# Patient Record
Sex: Female | Born: 1945 | Race: White | Hispanic: No | Marital: Married | Smoking: Never smoker
Health system: Southern US, Community
[De-identification: ages and names within clinical notes are randomized; demographics above are authoritative.]

## PROBLEM LIST (undated history)

## (undated) DIAGNOSIS — M5030 Other cervical disc degeneration, unspecified cervical region: Secondary | ICD-10-CM

## (undated) DIAGNOSIS — D649 Anemia, unspecified: Secondary | ICD-10-CM

## (undated) DIAGNOSIS — K589 Irritable bowel syndrome without diarrhea: Secondary | ICD-10-CM

## (undated) DIAGNOSIS — J45909 Unspecified asthma, uncomplicated: Secondary | ICD-10-CM

## (undated) DIAGNOSIS — R053 Chronic cough: Secondary | ICD-10-CM

## (undated) DIAGNOSIS — K219 Gastro-esophageal reflux disease without esophagitis: Secondary | ICD-10-CM

## (undated) DIAGNOSIS — H269 Unspecified cataract: Secondary | ICD-10-CM

## (undated) DIAGNOSIS — R05 Cough: Secondary | ICD-10-CM

## (undated) DIAGNOSIS — M8580 Other specified disorders of bone density and structure, unspecified site: Secondary | ICD-10-CM

## (undated) DIAGNOSIS — T7840XA Allergy, unspecified, initial encounter: Secondary | ICD-10-CM

## (undated) HISTORY — DX: Other cervical disc degeneration, unspecified cervical region: M50.30

## (undated) HISTORY — DX: Anemia, unspecified: D64.9

## (undated) HISTORY — PX: BREAST LUMPECTOMY: SHX2

## (undated) HISTORY — DX: Allergy, unspecified, initial encounter: T78.40XA

## (undated) HISTORY — DX: Other specified disorders of bone density and structure, unspecified site: M85.80

## (undated) HISTORY — PX: OTHER SURGICAL HISTORY: SHX169

## (undated) HISTORY — PX: TUBAL LIGATION: SHX77

## (undated) HISTORY — DX: Cough: R05

## (undated) HISTORY — PX: FOOT SURGERY: SHX648

## (undated) HISTORY — DX: Irritable bowel syndrome, unspecified: K58.9

## (undated) HISTORY — DX: Unspecified cataract: H26.9

## (undated) HISTORY — DX: Chronic cough: R05.3

## (undated) HISTORY — DX: Unspecified asthma, uncomplicated: J45.909

## (undated) HISTORY — DX: Gastro-esophageal reflux disease without esophagitis: K21.9

---

## 1998-04-29 ENCOUNTER — Ambulatory Visit (HOSPITAL_COMMUNITY): Admission: RE | Admit: 1998-04-29 | Discharge: 1998-04-29 | Payer: Self-pay | Admitting: Obstetrics and Gynecology

## 1999-01-30 ENCOUNTER — Other Ambulatory Visit: Admission: RE | Admit: 1999-01-30 | Discharge: 1999-01-30 | Payer: Self-pay | Admitting: Obstetrics and Gynecology

## 1999-08-13 ENCOUNTER — Encounter: Payer: Self-pay | Admitting: Internal Medicine

## 1999-08-13 ENCOUNTER — Encounter: Admission: RE | Admit: 1999-08-13 | Discharge: 1999-08-13 | Payer: Self-pay | Admitting: Internal Medicine

## 2000-04-05 ENCOUNTER — Encounter: Payer: Self-pay | Admitting: Obstetrics and Gynecology

## 2000-04-05 ENCOUNTER — Encounter: Admission: RE | Admit: 2000-04-05 | Discharge: 2000-04-05 | Payer: Self-pay | Admitting: Obstetrics and Gynecology

## 2000-04-26 ENCOUNTER — Other Ambulatory Visit: Admission: RE | Admit: 2000-04-26 | Discharge: 2000-04-26 | Payer: Self-pay | Admitting: Obstetrics and Gynecology

## 2001-04-06 ENCOUNTER — Encounter: Admission: RE | Admit: 2001-04-06 | Discharge: 2001-04-06 | Payer: Self-pay | Admitting: Obstetrics and Gynecology

## 2001-04-06 ENCOUNTER — Encounter: Payer: Self-pay | Admitting: Obstetrics and Gynecology

## 2001-04-27 ENCOUNTER — Other Ambulatory Visit: Admission: RE | Admit: 2001-04-27 | Discharge: 2001-04-27 | Payer: Self-pay | Admitting: Obstetrics and Gynecology

## 2002-05-23 ENCOUNTER — Other Ambulatory Visit: Admission: RE | Admit: 2002-05-23 | Discharge: 2002-05-23 | Payer: Self-pay | Admitting: Obstetrics and Gynecology

## 2002-06-06 ENCOUNTER — Encounter: Payer: Self-pay | Admitting: Obstetrics and Gynecology

## 2002-06-06 ENCOUNTER — Encounter: Admission: RE | Admit: 2002-06-06 | Discharge: 2002-06-06 | Payer: Self-pay | Admitting: Obstetrics and Gynecology

## 2003-06-11 ENCOUNTER — Encounter: Admission: RE | Admit: 2003-06-11 | Discharge: 2003-06-11 | Payer: Self-pay | Admitting: Obstetrics and Gynecology

## 2003-06-20 ENCOUNTER — Other Ambulatory Visit: Admission: RE | Admit: 2003-06-20 | Discharge: 2003-06-20 | Payer: Self-pay | Admitting: Obstetrics and Gynecology

## 2004-06-25 ENCOUNTER — Other Ambulatory Visit: Admission: RE | Admit: 2004-06-25 | Discharge: 2004-06-25 | Payer: Self-pay | Admitting: Obstetrics and Gynecology

## 2004-07-23 ENCOUNTER — Ambulatory Visit: Payer: Self-pay | Admitting: Internal Medicine

## 2004-11-23 ENCOUNTER — Ambulatory Visit: Payer: Self-pay | Admitting: Internal Medicine

## 2005-07-16 ENCOUNTER — Other Ambulatory Visit: Admission: RE | Admit: 2005-07-16 | Discharge: 2005-07-16 | Payer: Self-pay | Admitting: Obstetrics and Gynecology

## 2007-04-26 ENCOUNTER — Ambulatory Visit: Payer: Self-pay | Admitting: Internal Medicine

## 2007-05-05 ENCOUNTER — Encounter: Admission: RE | Admit: 2007-05-05 | Discharge: 2007-05-05 | Payer: Self-pay | Admitting: Internal Medicine

## 2007-06-29 ENCOUNTER — Ambulatory Visit: Payer: Self-pay | Admitting: Internal Medicine

## 2007-06-29 DIAGNOSIS — R059 Cough, unspecified: Secondary | ICD-10-CM | POA: Insufficient documentation

## 2007-06-29 DIAGNOSIS — K219 Gastro-esophageal reflux disease without esophagitis: Secondary | ICD-10-CM | POA: Insufficient documentation

## 2007-06-29 DIAGNOSIS — R05 Cough: Secondary | ICD-10-CM

## 2007-06-29 DIAGNOSIS — J309 Allergic rhinitis, unspecified: Secondary | ICD-10-CM | POA: Insufficient documentation

## 2007-07-03 ENCOUNTER — Ambulatory Visit: Payer: Self-pay | Admitting: Internal Medicine

## 2007-07-11 ENCOUNTER — Telehealth (INDEPENDENT_AMBULATORY_CARE_PROVIDER_SITE_OTHER): Payer: Self-pay | Admitting: *Deleted

## 2007-07-18 ENCOUNTER — Encounter: Payer: Self-pay | Admitting: Internal Medicine

## 2007-08-30 ENCOUNTER — Encounter: Payer: Self-pay | Admitting: Internal Medicine

## 2007-09-13 ENCOUNTER — Ambulatory Visit: Payer: Self-pay | Admitting: Internal Medicine

## 2007-12-22 ENCOUNTER — Ambulatory Visit: Payer: Self-pay | Admitting: Internal Medicine

## 2008-02-02 ENCOUNTER — Ambulatory Visit: Payer: Self-pay | Admitting: Internal Medicine

## 2008-04-05 ENCOUNTER — Telehealth: Payer: Self-pay | Admitting: Internal Medicine

## 2008-04-08 ENCOUNTER — Ambulatory Visit: Payer: Self-pay | Admitting: Internal Medicine

## 2008-04-08 DIAGNOSIS — K589 Irritable bowel syndrome without diarrhea: Secondary | ICD-10-CM | POA: Insufficient documentation

## 2008-04-12 ENCOUNTER — Ambulatory Visit: Payer: Self-pay | Admitting: Internal Medicine

## 2008-04-29 ENCOUNTER — Telehealth: Payer: Self-pay | Admitting: Internal Medicine

## 2008-06-21 ENCOUNTER — Ambulatory Visit: Payer: Self-pay | Admitting: Internal Medicine

## 2009-09-15 ENCOUNTER — Telehealth: Payer: Self-pay | Admitting: Internal Medicine

## 2009-09-15 ENCOUNTER — Ambulatory Visit (HOSPITAL_COMMUNITY): Admission: RE | Admit: 2009-09-15 | Discharge: 2009-09-15 | Payer: Self-pay | Admitting: Internal Medicine

## 2009-09-15 ENCOUNTER — Ambulatory Visit: Payer: Self-pay | Admitting: Internal Medicine

## 2009-09-15 DIAGNOSIS — R1011 Right upper quadrant pain: Secondary | ICD-10-CM | POA: Insufficient documentation

## 2009-09-15 DIAGNOSIS — R1013 Epigastric pain: Secondary | ICD-10-CM | POA: Insufficient documentation

## 2009-09-16 ENCOUNTER — Telehealth: Payer: Self-pay | Admitting: Physician Assistant

## 2009-09-16 DIAGNOSIS — K8020 Calculus of gallbladder without cholecystitis without obstruction: Secondary | ICD-10-CM | POA: Insufficient documentation

## 2009-09-16 LAB — CONVERTED CEMR LAB
Albumin: 4 g/dL (ref 3.5–5.2)
BUN: 14 mg/dL (ref 6–23)
Basophils Absolute: 0 10*3/uL (ref 0.0–0.1)
Basophils Relative: 0.5 % (ref 0.0–3.0)
CO2: 30 meq/L (ref 19–32)
Calcium: 9.3 mg/dL (ref 8.4–10.5)
Chloride: 108 meq/L (ref 96–112)
Creatinine, Ser: 1 mg/dL (ref 0.4–1.2)
Eosinophils Absolute: 0.1 10*3/uL (ref 0.0–0.7)
GFR calc non Af Amer: 59.39 mL/min (ref >60)
Hemoglobin: 13.9 g/dL (ref 12.0–15.0)
Lymphocytes Relative: 26.8 % (ref 12.0–46.0)
Monocytes Relative: 8.4 % (ref 3.0–12.0)
Neutro Abs: 2.2 10*3/uL (ref 1.4–7.7)
Neutrophils Relative %: 62.3 % (ref 43.0–77.0)
Potassium: 4.1 meq/L (ref 3.5–5.1)
RBC: 4.36 M/uL (ref 3.87–5.11)
RDW: 12.4 % (ref 11.5–14.6)

## 2009-10-01 ENCOUNTER — Encounter: Payer: Self-pay | Admitting: Internal Medicine

## 2009-11-09 HISTORY — PX: LAPAROSCOPIC CHOLECYSTECTOMY: SUR755

## 2009-11-17 ENCOUNTER — Ambulatory Visit (HOSPITAL_COMMUNITY): Admission: RE | Admit: 2009-11-17 | Discharge: 2009-11-17 | Payer: Self-pay | Admitting: General Surgery

## 2009-12-10 ENCOUNTER — Encounter: Payer: Self-pay | Admitting: Internal Medicine

## 2010-02-10 ENCOUNTER — Telehealth (INDEPENDENT_AMBULATORY_CARE_PROVIDER_SITE_OTHER): Payer: Self-pay | Admitting: *Deleted

## 2010-02-12 ENCOUNTER — Ambulatory Visit: Payer: Self-pay | Admitting: Internal Medicine

## 2010-02-19 ENCOUNTER — Telehealth (INDEPENDENT_AMBULATORY_CARE_PROVIDER_SITE_OTHER): Payer: Self-pay | Admitting: *Deleted

## 2010-02-24 ENCOUNTER — Encounter: Payer: Self-pay | Admitting: Internal Medicine

## 2010-03-26 ENCOUNTER — Ambulatory Visit: Payer: Self-pay | Admitting: Internal Medicine

## 2010-03-26 DIAGNOSIS — J209 Acute bronchitis, unspecified: Secondary | ICD-10-CM | POA: Insufficient documentation

## 2010-08-11 NOTE — Letter (Signed)
Summary: [facility]   Imported By: Lester Whiteash 10/28/2009 09:38:23  _____________________________________________________________________  External Attachment:    Type:   Image     Comment:   External Document

## 2010-08-11 NOTE — Progress Notes (Signed)
Summary: pt waiting on Prior Auth.  Phone Note Call from Patient Call back at Home Phone [phone]   Caller: Patient Call For: young Summary of Call: pt says [facility] [address] has faxed request for prior auth for SINGULAIR.  Initial call taken by: Tivis Ringer, CNA,  February 19, 2010 10:17 AM  Follow-up for Phone Call        tammy is working on this in triage   Philipp Deputy CMA  February 19, 2010 11:17 AM   form filled out and gave to cy for questions and signature  Philipp Deputy [facility]  February 19, 2010 2:31 PM   pt called back to make sure we were working on this prior auth.  Has not started Singulair yet . Follow-up by: Eugene Gavia,  February 20, 2010 9:13 AM  Additional Follow-up for Phone Call Additional follow up Details #1::        Forms faxed back and in Triage waiting a decision.Reynaldo Minium CMA  February 20, 2010 2:17 PM    PA has been approved 02/21/2010-11/16/2012; pharmacy aware and left message on machine for pt.Reynaldo Minium CMA  February 24, 2010 9:48 AM

## 2010-08-11 NOTE — Progress Notes (Signed)
Summary: TRIAGE: Work in Teacher, English as a foreign language from Patient Call back at [employer] [phone]   Call For: DR Leone Payor Reason for Call: Talk to Nurse Summary of Call: Is in alot of pain and wants to be worked in - today if possible. Initial call taken by: Leanor Kail [facility],  September 15, 2009 12:31 PM  Follow-up for Phone Call        Pt c/o upper abd pain off and on since Friday pm.  Pain comes and goes.  Severe at times.  OV sch for 2:30 today with Mike Gip, PA. Follow-up by: Ashok Cordia RN,  September 15, 2009 12:44 PM

## 2010-08-11 NOTE — Progress Notes (Signed)
Summary: Triage  Phone Note Call from Patient Call back at Home Phone [phone]   Caller: Patient Call For: Mike Gip Reason for Call: Lab or Test Results Summary of Call: The bloodwork that was taken this morning showed elevated Liver Counts. And she wants to know why they are so elevated? Initial call taken by: Karna Christmas,  September 16, 2009 1:59 PM  Follow-up for Phone Call        I mentioned to Amy the pt's concern of the elevated ALT and AST tests.  She immediately said it is the Gallbladder.  I informed the pt.  Follow-up by: Joselyn Glassman,  September 16, 2009 3:01 PM

## 2010-08-11 NOTE — Assessment & Plan Note (Signed)
Summary: cough,congestion,weak,diarrhea,hoarse,? bronchitis/apc   Copy to:  n/a Primary [name]/Referring [name]:  n/a  CC:  Accute visit-cough, nausea, fever/chilss, not wanting to eat, and chest tightness since Saturday.Beverly [name]  History of Present Illness: 06/21/08- Cough, allergic rhinitis, GERD Has replaced carpet with hardwood and put down vapor barrier under house. On samples Kapedex for GERD. Doesn't feel heartburn. Coughing paroxysms productive white. Seat belt, cold air and rescue inhalers all tend to trigger.  February 12, 2010- Cough, allergic rhinitis, GERD Since last here had Lap chole.in May. Cough has been known to be related to reflux in past, but not affected by the GB surgery. had no anesthesia problems. She says cough has actually been worse. She has had renovation and sheet rock sanding at homes in [place] and here. Has also felt more easily short of breath walking in this heat. Always feels sinuses drain with increased sneeze when she dips her head down. She asks about Singulair, which we had tried years ago but will revisit.  March 26, 2010- Cough, allergic rhinitis, GERD She has been taking the Singulair from last visit.  . Got a cold coming back from [facility] 03/01/10. husband got over it but she didn't- still tired.. Acutely worse 5 [place] Nausea, diarrhea, headache, fever 99 range, dry cough. No appetite, queasy, weak. Still some diarrhea. Took Aleve and immodium. She put herself on Daliresp for first time 3 days ago, hoping to stop the cough which is producitve of copious clear sticky mucus. She was already having diarrhea when she started it. She is not seeking a lot of meds.   Preventive Screening-Counseling & Management  Alcohol-Tobacco     Smoking Status: never  Current Medications (verified): 1)  Glucosamine-Chondroitin 1500-1200 Mg/44ml  Liqd (Glucosamine-Chondroitin) .... Take 1 By Mouth Once Daily 2)  Zyrtec Allergy 10  Mg  Tabs (Cetirizine Hcl) .... Take 1 By Mouth At Bedtime 3)  Femhrt 1/5 1-5 Mg-Mcg  Tabs (Norethindrone-Eth Estradiol) .... Take 1/2 Tablet By Mouth Every Other Day 4)  Imodium Advanced 2-125 Mg Tabs (Loperamide-Simethicone) .... Take 1 By Mouth Once Daily As Needed 5)  Omeprazole 20 Mg Cpdr (Omeprazole) .Beverly [name].. 1 By Mouth Once Daily 6)  Claritin 10 Mg Tabs (Loratadine) .... Take 1 By Mouth Once Daily 7)  Daily Multiple Vitamins  Tabs (Multiple Vitamin) .Beverly [name].. 1 By Mouth Once Daily 8)  Singulair 10 Mg Tabs (Montelukast Sodium) .Beverly [name].. 1 Daily 9)  Daliresp 500 Mcg .Beverly KitchenMarland [name]. 1 Daily  Allergies (verified): No Known Drug Allergies  Past History:  Past Surgical History: Last updated: 02/02/2008 Foot Rt. repair Tubal ligation Lumpectomy bilateral  Neg.  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  Risk Factors: Caffeine Use: 1 (02/02/2008) Exercise: yes (04/08/2008)  Risk Factors: Smoking Status: never (03/26/2010)  Past Medical History: ALLERGIC RHINITIS  ESOPHAGEAL REFLUX  Chronic cough IBS  Review of Systems      See HPI       The patient complains of shortness of breath with activity, productive cough, chest pain, weight change, and fever.  The patient denies coughing up blood, irregular heartbeats, acid heartburn, indigestion, loss of appetite, abdominal pain, difficulty swallowing, sore throat, tooth/dental problems, headaches, nasal congestion/difficulty breathing through nose, sneezing, ear ache, anxiety,  hand/feet swelling, joint stiffness or pain, and rash.    Vital Signs:  Patient profile:   65 year old female Height:      64.25 inches Weight:      122.38 pounds BMI:     20.92 O2 Sat:      97 % on Room air Temp:     98.6 degrees F oral Pulse rate:   86 / minute BP sitting:   118 / 76  (left arm) Cuff size:   regular  Vitals Entered By: Reynaldo Minium CMA (March 26, 2010 4:17 PM)  O2 Flow:  Room air CC: Accute visit-cough, nausea,fever/chilss, not wanting to eat, chest tightness since Saturday.   Physical Exam  Additional Exam:  General: A/Ox3; pleasant and cooperative, NAD, SKIN: no rash, lesions NODES: no lymphadenopathy HEENT: [place]/AT, EOM- WNL, Conjuctivae- clear, PERRLA, TM-WNL, Nose- clear, Throat- clear and wnl, Mallampati III NECK: Supple w/ fair ROM, JVD- none, normal carotid impulses w/o bruits Thyroid- normal to palpation, no stridor or hoarseness CHEST: few crackles, dry cough HEART: RRR, no m/g/r heard ABDOMEN: Slender FBP:ZWCH, nl pulses, no edema  NEURO: Grossly intact to observation      Impression & Recommendations:  Problem # 1:  ACUTE BRONCHITIS (ICD-466.0)  Acute infection -bronchitis and gastroenteritis. I have seen 3 people with  similar story today, except for the gastroenteritis symptoms, so there is somethng passing through the community.  Will give Z pak, encourage fluisds and rest. Hydrocodone for cough if needed. She can use immodium as she has been if needed. Her updated medication list for this problem includes:    Singulair 10 Mg Tabs (Montelukast sodium) .Beverly [name]... 1 daily    Zithromax Z-pak 250 Mg Tabs (Azithromycin) .Beverly [name]... 2 today then one daily  Problem # 2:  COUGH (ICD-786.2) Chronic cough has been a problem for years but not dominant now. it has been felt by me and by [facility], to be caused by reflux which she has under better control.  Medications Added to Medication List This Visit: 1)  Zithromax Z-pak 250 Mg Tabs (Azithromycin)  .... 2 today then one daily  Other Orders: Est. Patient Level III (85277)  Patient Instructions: 1)  Keep scheduled appointment- earlier as needed 2)  Script for Z pak antibiotic sent 3)  Stay warm and rested, plenty of fluids Prescriptions: ZITHROMAX Z-PAK 250 MG TABS (AZITHROMYCIN) 2 today then one daily  #1 pak x 0   Entered and Authorized by:   Waymon Budge MD   Signed by:   Waymon Budge MD on 03/26/2010   Method used:   Electronically to        [facility]. [phone]* (retail)       [phone]       [place], [place]  [address]       Ph: [phone]       Fax: [phone]   RxID:   [id number]

## 2010-08-11 NOTE — Progress Notes (Signed)
Summary: cough  Phone Note Call from Patient Call back at Home Phone [phone]   Caller: Patient Call For: young Summary of Call: pt has been coughing "for 10 yrs". says she has 2 friends who are nurses who questioned why she has not been put on singulair. [phone]. NOTE: pt hasn't seen dr young since 06/21/08 but says that dr young "wanted her to be on a long leash".  Initial call taken by: Tivis Ringer, CNA,  February 10, 2010 10:16 AM  Follow-up for Phone Call        pt refused appt for today at 4 pm  will forward to [facility] to find another work in slot for pt --pt aware   Philipp Deputy CMA  February 10, 2010 10:39 AM   Additional Follow-up for Phone Call Additional follow up Details #1::        Spoke with pt-appt made for Thursday 02-12-10 at 130pm.Katie [facility] CMA  February 10, 2010 12:27 PM

## 2010-08-11 NOTE — Assessment & Plan Note (Signed)
Summary: FOLLOW UP-cough/concerns/kcw   Copy to:  n/a Primary Provider/Referring Provider:  n/a  CC:  Follow up visit-cough x 10 years..  History of Present Illness: 12/22/07- 65 year old woman returning for follow-up of chronic cough, associated with allergic rhinitis, and especially with Genella Rife.  She is more satisfied now that reflux is an important part of this cough.  She never feels indigestion.  She avoids trigger foods.  Elevating head of bed has helped him.  When compared with vacation when the bed is not elevated.  Her cat died.  We discussed carpet in the home and environmental precautions, giving her printed information.  She says cold air, vinegar salad dressing, coffee, Coca-Cola, spicy food, and sitting down in the car, are all apt to trigger her cough. Denies headache, sinus drainage, sneezing chest pain, dyspnea, n/v/d, weight loss, fever, edema.   06/21/08- Cough, allergic rhinitis, GERD Has replaced carpet with hardwood and put down vapor barrier under house. On samples Kapedex for GERD. Doesn't feel heartburn. Coughing paroxysms productive white. Seat belt, cold air and rescue inhalers all tend to trigger.  February 12, 2010- Cough, allergic rhinitis, GERD Since last here had Lap chole.in May. Cough has been known to be related to reflux in past, but not affected by the GB surgery. had no anesthesia problems. She says cough has actually been worse. She has had renovation and sheet rock sanding at homes in [place] and here. Has also felt more easily short of breath walking in this heat. Always feels sinuses drain with increased sneeze when she dips her head down. She asks about Singulair, which we had tried years ago but will revisit.     Preventive Screening-Counseling & Management  Alcohol-Tobacco     Smoking Status: never  Current Medications (verified): 1)  Glucosamine-Chondroitin 1500-1200 Mg/47ml  Liqd (Glucosamine-Chondroitin) .... Take 1 By Mouth Once Daily 2)   Zyrtec Allergy 10 Mg  Tabs (Cetirizine Hcl) .... Take 1 By Mouth At Bedtime 3)  Femhrt 1/5 1-5 Mg-Mcg  Tabs (Norethindrone-Eth Estradiol) .... Take 1/2 Tablet By Mouth Every Other Day 4)  Imodium Advanced 2-125 Mg Tabs (Loperamide-Simethicone) .... Take 1 By Mouth Once Daily As Needed 5)  Omeprazole 20 Mg Cpdr (Omeprazole) .Marland Kitchen.. 1 By Mouth Once Daily 6)  Claritin 10 Mg Tabs (Loratadine) .... Take 1 By Mouth Once Daily 7)  Daily Multiple Vitamins  Tabs (Multiple Vitamin) .Marland Kitchen.. 1 By Mouth Once Daily  Allergies (verified): No Known Drug Allergies  Past History:  Past Medical History: Last updated: 2009-09-23 ALLERGIC RHINITIS    ESOPHAGEAL REFLUX  IBS  Past Surgical History: Last updated: 02/02/2008 Foot Rt. repair Tubal ligation Lumpectomy bilateral  Neg.  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  Risk Factors: Caffeine Use: 1 (02/02/2008) Exercise: yes (04/08/2008)  Risk Factors: Smoking Status: never (02/12/2010)  Social History: Smoking Status:  never  Review of Systems      See HPI       The patient complains of shortness of breath with activity, nasal congestion/difficulty breathing through nose, and sneezing.  The patient  denies shortness of breath at rest, productive cough, non-productive cough, coughing up blood, chest pain, irregular heartbeats, acid heartburn, indigestion, loss of appetite, weight change, abdominal pain, difficulty swallowing, sore throat, tooth/dental problems, and  headaches.    Vital Signs:  Patient profile:   65 year old female Height:      64.25 inches Weight:      124.13 pounds O2 Sat:      98 % on Room air Pulse rate:   63 / minute BP sitting:   120 / 72  (left arm) Cuff size:   regular  Vitals Entered By: Reynaldo Minium CMA (February 12, 2010 1:41 PM)  O2 Flow:  Room air CC: Follow up visit-cough x 10 years.   Physical Exam  Additional Exam:  General: A/Ox3; pleasant and cooperative, NAD, SKIN: no rash, lesions NODES: no lymphadenopathy HEENT: [place]/AT, EOM- WNL, Conjuctivae- clear, PERRLA, TM-WNL, Nose- clear, Throat- clear and wnl, Mallampati III NECK: Supple w/ fair ROM, JVD- none, normal carotid impulses w/o bruits Thyroid- normal to palpation, no stridor or hoarseness CHEST: Clear to P&A, dry cough HEART: RRR, no m/g/r heard ABDOMEN: Slender XLK:GMWN, nl pulses, no edema  NEURO: Grossly intact to observation      Impression & Recommendations:  Problem # 1:  COUGH (ICD-786.2)  Multifactorial cough with allergic and reflux components identified previously. She also has been exposed to the orange ozone air quality and dusty home renovations. She doesn't like inhalers, which tend to make her cough till her eyes water. We will try Singulair, then maybe try Daliresp.  Problem # 2:  ALLERGIC RHINITIS (ICD-477.9)  No overt sinus infectin, but she does note some positional drainage sensation withut headache or discolored mucus. Her updated medication list for this problem includes:    Zyrtec Allergy 10 Mg Tabs (Cetirizine hcl) .Marland Kitchen... Take 1 by mouth at bedtime    Claritin 10 Mg Tabs (Loratadine) .Marland Kitchen... Take 1 by mouth once daily  Medications Added to Medication List This Visit: 1)  Zyrtec Allergy 10 Mg Tabs (Cetirizine hcl) .... Take 1 by mouth at bedtime 2)  Claritin 10 Mg Tabs (Loratadine) .... Take 1 by mouth once daily 3)  Singulair 10 Mg Tabs (Montelukast sodium) .Marland Kitchen.. 1 daily 4)  Daliresp 500 Mcg  .Marland Kitchen.. 1 daily  Other  Orders: Est. Patient Level IV (02725)  Patient Instructions: 1)  Please schedule a follow-up appointment in 1 month. 2)  Sample/ script Singulair 10 mg   1 daily 3)  If that doesn't work well enough, then try  sample/ script 4)  Daliresp 500 micrograms, 1 daily Prescriptions: DALIRESP 500 MCG 1 daily  #30 x prn   Entered and Authorized by:   Waymon Budge MD   Signed by:   Waymon Budge MD on 02/12/2010   Method used:   Print then Give to Patient   RxID:   [id number] SINGULAIR 10 MG TABS (MONTELUKAST SODIUM) 1 daily  #30 x prn   Entered and Authorized by:   Waymon Budge MD   Signed by:   Waymon Budge MD on 02/12/2010   Method used:   Print then Give to Patient   RxID:   [id number]

## 2010-08-11 NOTE — Medication Information (Signed)
Summary: [employer]   Imported By: Valinda Hoar 02/24/2010 13:01:24  _____________________________________________________________________  External Attachment:    Type:   Image     Comment:   External Document

## 2010-08-11 NOTE — Progress Notes (Signed)
Summary: Lab results  Phone Note Call from Patient Call back at Home Phone [phone]   Call For: Mike Gip, NP Reason for Call: Lab or Test Results Summary of Call: Very upset because she was told by Karen Kitchens she would have her lab results by 10am. Then the lab told her it could be noon before she would get results. Wants to know her lab results as soon as possible.  Initial call taken by: Leanor Kail [facility],  September 16, 2009 1:08 PM  Follow-up for Phone Call        I spoke to the pt and  gave her the lab results.  She asked me a few questions I could not answer but did tell her they are good questions she can ask the surgeon at [facility].  She thanked me for calling. Follow-up by: Joselyn Glassman,  September 16, 2009 1:49 PM

## 2010-08-11 NOTE — Assessment & Plan Note (Signed)
Summary: RUQ pain/dfs   History of Present Illness Visit Type: Follow-up Visit Primary GI MD: Stan Head MD [facility] Primary Provider: n/a Requesting Provider: n/a Chief Complaint: RUQ pain that comes and goes but hurts very badly and the pain radiates to her back History of Present Illness:   65 YO FEMALE KNOWN TO DR. Leone Payor WITH HX OF GERD AND IBS. SHE COMES IN TODAY WITH C/O EPIGASTRIC PAIN ATTACKS WHICH STARTED ON 09/12/09. THESE ARE COMPLETELY DIFFERENT THAN ANY OF HER IBS SXS.SHE HAD INITIAL EPISODE FRIDAY NIGHT  ABOUT 2 AM WOKE FROM SLEEP WITH INTENSE EPIGASTRIC PAIN,PRESSURE WHICH LASTED 15-20 MINUTES THEN RESOLVED. SHE HAD EATEN A HOT DOG WITH [employer] FOR DINNER WHICH IS VERY UNUSUAL FOR HER. ON SATURDAY NIGHT SHE HAD A SIMILAR ATTACK ABOUT 2 AM, NOT QUITE AS SEVERE. LAST PM SHE HAD AN ATTACK ABOUT 2:45 AM WHICH LASTED LONGER AND WAS MORE INTENSE. NO N/V,NO DIARRHEA, NO FEVER/CHILLS ,DIAPHORESIS. NO SOB,DIZZINESS,CHEST PAIN.THIS AM SHE HAD AN EPISODE ABOUT 11 AM,HAD EATEN BREAKFAST AT 9AM-THIS WAS INTENSE AND RADIATED TO HER BACK. SHE DESCRIBES THE PAIN AS DEBILITATING. NO NEW MEDS.   GI Review of Systems    Reports abdominal pain and  bloating.     Location of  Abdominal pain: upper abdomen.    Denies acid reflux, belching, chest pain, dysphagia with liquids, dysphagia with solids, heartburn, loss of appetite, nausea, vomiting, vomiting blood, and  weight loss.      Reports irritable bowel syndrome.     Denies anal fissure, black tarry stools, change in bowel habit, constipation, diarrhea, diverticulosis, fecal incontinence, heme positive stool, hemorrhoids, jaundice, light color stool, liver problems, rectal bleeding, and  rectal pain.    Current Medications (verified): 1)  Glucosamine-Chondroitin 1500-1200 Mg/27ml  Liqd (Glucosamine-Chondroitin) .... Take 1 By Mouth Once Daily 2)  Zyrtec Allergy 10 Mg  Tabs (Cetirizine Hcl) .... Take 1 By Mouth Once Daily 3)  Femhrt 1/5 1-5 Mg-Mcg   Tabs (Norethindrone-Eth Estradiol) .... Take 1/2 Tablet By Mouth Every Other Day 4)  Imodium Advanced 2-125 Mg Tabs (Loperamide-Simethicone) .... Take 1 By Mouth Once Daily As Needed 5)  Omeprazole 20 Mg Cpdr (Omeprazole) .Marland Kitchen.. 1 By Mouth Once Daily 6)  Claritin 10 Mg Tabs (Loratadine) .... As Needed 7)  Daily Multiple Vitamins  Tabs (Multiple Vitamin) .Marland Kitchen.. 1 By Mouth Once Daily  Allergies (verified): No Known Drug Allergies  Past History:  Past Medical History: ALLERGIC RHINITIS    ESOPHAGEAL REFLUX  IBS  Past Surgical History: Reviewed history from 02/02/2008 and no changes required. Foot Rt. repair Tubal ligation Lumpectomy bilateral  Neg.  2008  Family History: Reviewed history from 02/02/2008 and no changes required.  Mother- deceased age 28; pancreatic cancer Father- deceased age 41; heart disease Brother- living age 22; hypertension; hyperlipidemia Sister-living age 34; sinus trouble Family History of Colon Cancer: MGrandfather  Social History: Reviewed history from 06/21/2008 and no changes required. 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  Review of Systems       The patient complains of allergy/sinus, back pain, and cough.  The patient denies anemia, anxiety-new, arthritis/joint pain, blood in urine, breast changes/lumps, change in vision, confusion, coughing up blood, depression-new, fainting, fatigue, fever, headaches-new, hearing problems, heart murmur, heart rhythm changes, itching, menstrual pain, muscle pains/cramps, night sweats, nosebleeds, pregnancy symptoms,  shortness of breath, thirst - excessive, urination - excessive, urination changes/pain, urine leakage, vision changes, and voice change.         ROS OTHERWISE AS IN HPI  Vital Signs:  Patient profile:   65 year old female Height:       64.25 inches Weight:      130 pounds BMI:     22.22 BSA:     1.63 Pulse rate:   72 / minute Pulse rhythm:   regular BP sitting:   124 / 74  (left arm)  Vitals Entered By: Merri Ray CMA Duncan Dull) (September 15, 2009 2:38 PM)  Physical Exam  General:  Well developed, well nourished, no acute distress. Head:  Normocephalic and atraumatic. Eyes:  PERRLA, no icterus. Neck:  Supple; no masses or thyromegaly. Lungs:  Clear throughout to auscultation. Heart:  Regular rate and rhythm; no murmurs, rubs,  or bruits. Abdomen:  SOFT, MILD TENDERNESS IN RUQ, NO PALP MASS OR HSM,PROMINENET AORTIC PULSATION-PT THINA AND DOES NOT FEEL WIDE Rectal:  NOT DONE Extremities:  No clubbing, cyanosis, edema or deformities noted. Neurologic:  Alert and  oriented x4;  grossly normal neurologically. Psych:  anxious.     Impression & Recommendations:  Problem # 1:  ABDOMINAL PAIN, EPIGASTRIC (ICD-789.06) Assessment New 65 YO FEMALE WITH HX OF GERD, AND IBS WITH ACUTE EPISODIC EPIGASTRIC PAIN WITH RADIATION TO THE BACK X 3 DAYS. SUSPECT BILIARY COLIC.  LABS AS BELOW  ABDOMINAL US WITHIN THE NEXT 24 HOURS CLEAR TO LOW FAT FULL LIQUIDS TONIGHT INCREASE PRILOSEC TO TWICE DAILY X 2 WEEKS OFFERED ANALGESIC BUT SHE DOES NOT WANT TO MASK THE PAIN. FURTHER WORKUP PENDING RESULTS OF ABOVE Orders: TLB-CMP (Comprehensive Metabolic Pnl) ([id number]) TLB-CBC Platelet - w/Differential ([id number]) TLB-Lipase ([id number]) Ultrasound Abdomen (UAS)  Problem # 2:  IRRITABLE BOWEL SYNDROME (ICD-564.1) Assessment: Unchanged  Problem # 3:  ESOPHAGEAL REFLUX (ICD-530.81) Assessment: Unchanged  Patient Instructions: 1)  Please go to our lab, basement level. 2)  Please go directly to [facility] today, 1st floor for the UltraSound. 3)  Full liquid diet and Clear Liquid Diet brochures.

## 2010-08-14 NOTE — Letter (Signed)
Summary: [facility]   Imported By: Sherian Rein 02/11/2010 09:52:58  _____________________________________________________________________  External Attachment:    Type:   Image     Comment:   External Document

## 2010-09-29 LAB — COMPREHENSIVE METABOLIC PANEL
ALT: 21 U/L (ref 0–35)
AST: 27 U/L (ref 0–37)
Alkaline Phosphatase: 53 U/L (ref 39–117)
CO2: 28 mEq/L (ref 19–32)
Calcium: 9.5 mg/dL (ref 8.4–10.5)
Chloride: 105 mEq/L (ref 96–112)
GFR calc Af Amer: >60 mL/min (ref >60)
GFR calc non Af Amer: >60 mL/min (ref >60)
Glucose, Bld: 92 mg/dL (ref 70–99)
Sodium: 137 mEq/L (ref 135–145)
Total Bilirubin: 0.7 mg/dL (ref 0.3–1.2)

## 2010-09-29 LAB — CBC
Hemoglobin: 13.2 g/dL (ref 12.0–15.0)
MCHC: 34.4 g/dL (ref 30.0–36.0)
RBC: 4.13 MIL/uL (ref 3.87–5.11)
WBC: 5.7 10*3/uL (ref 4.0–10.5)

## 2010-11-24 NOTE — Assessment & Plan Note (Signed)
[facility]                         GASTROENTEROLOGY OFFICE NOTE   NAME:BULLUCKAnnalaya, Beverly [name]                      MRN:          [id number]  DATE:04/26/2007                            DOB:          1946-04-22    CHIEF COMPLAINT:  Cough.   HISTORY:  This is a 65 year old self-referred white woman coming to see  me because of chronic cough.  She has had a 15-year history of this.  She describes a daily cough not typically nocturnal.  She has postnasal  drip.  She has failed treatments for allergies, and does not have  asthma, she tells me.  Review of the records from when she had seen Dr.  Maple Hudson in the past seem to confirm this.  Certain foods will bother her.  She thinks postnasal drainage does bother her.  She also says the  morning is the worst time where she will bring up quite a bit of phlegm  and productive mucus.  Recently, she went on a veterinary call with her  husband, who suggested she come see somebody again because of the  persistent problems.  She thinks night air will trigger it.  She feels  like she bloats and distends quite a bit, but does not have much in the  way of heartburn.  Certain foods, though she cannot pinpoint the  completely, seem to bother her with the cough.  In the past, she has  been treated with Advair and other steroid inhalers, Rhinocort nasal  spray, proton pump inhibitors.  It sounds like Prilosec and Protonix  have been used.  She was at [facility] in the ENT Clinic, and records  indicate she had a positive pH probe.  She had a negative laryngoscopy.  The pH probe was off medication.  It is not really clear how long she  ever took a PPI on a b.i.d. dose, but she thinks it was for several  months.  She is not losing weight.  She is otherwise healthy it seems.  She recently switched from Claritin twice a day, or at least a generic  version of that, to Zyrtec daily.  She thinks her eyes are less puffy,  and there is less  postnasal drip.  I think she has taken allergy shots  before without significant relief.  She is not describing any dysphagia.   MEDICATIONS:  Glucosamine and chondroitin daily, Zyrtec 1 at bedtime,  femhrt 1/2 tablet each day.  Also uses Aleve p.r.n.   NO KNOWN DRUG ALLERGIES.   PAST MEDICAL HISTORY:  1. Chronic cough.  2. Former smoker.  3. Allergies with postnasal drainage.  4. Prior tubal ligation.  5. Prior benign breast biopsy/lumpectomy.   FAMILY HISTORY:  Her mother has pancreatic cancer, and is a patient of  mine.  Also has diabetes.  Father has congestive heart failure.   SOCIAL HISTORY:  She is married.  She is an Tree surgeon, previously Teaching laboratory technician, now an Horticulturist, commercial.  She lives with her husband.  She uses some  wine, which sometimes can trigger coughing.  No tobacco or drugs.   REVIEW OF  SYSTEMS:  See my medical history form.  She does complain of  sore throat and voice changes with hoarseness at times.  All other  systems negative as they are recorded in the chart.   PHYSICAL EXAMINATION:  Height 5 feet 5 inches.  Weight 133 pounds.  Blood pressure 110/98.  Pulse 68.  Well-developed, well-nourished white woman in no acute distress.  EYES:  Anicteric.  ENT:  Normal mouth and posterior pharynx.  NECK:  Supple.  No thyromegaly or mass.  CHEST:  Clear.  HEART:  S1 and S2 without gallops.  ABDOMEN:  Soft and nontender.  No organomegaly or mass.  LYMPHATICS:  No neck or supraclavicular nodes.  EXTREMITIES:  Free of edema.  SKIN:  Warm and dry.  No acute rash noted.  PSYCHOLOGIC:  She is alert and oriented x3.   ASSESSMENT:  Chronic cough.  It is hard to tell if this is related to  gastroesophageal reflux disease.  Often times it is multifactorial.  My  considerations are gastroesophageal reflux disease as a contributor,  postnasal drip, and perhaps even anxiety, which I have seen at times.  She was coughing at times.  When she settled down and talked to me, she   talked for several minutes without any coughing.  The lack of nocturnal  cough is interesting.  It is there some of the time, but not frequently.  I do not think she has every really tried antitussives necessarily.  If  she has, she has forgotten.  It sounds like it has not really responded  to allergy therapy.   PLAN:  1. Upper GI series to rule out a large hiatal hernia.  2. Nexium 40 mg twice daily before breakfast and supper.  3. Reduce caffeine, and a gastroesophageal reflux disease diet is      handed out to her today.  4. She might need an endoscopy and a pH probe later.  I doubt there is      utility in the endoscopy based upon this clinical scenario at this      time.  It may be useful to measure the Z-line and apply a bravo pH      probe on medicine, i.e., once we have her on b.i.d. BPI for a      couple of months, and she is still symptomatic, and a pH probe is      negative for reflux, I think that would be useful in suggesting      that reflux is not the major component.  We will see.  5. I have asked her follow up with Dr. Fannie Knee, who has been her      allergist and pulmonary physician.  She will make an appointment.  6. She is over 50, and it has been a long time since she had any type      of specific colorectal cancer screening.  She has had a flexible      sigmoidoscopy in the past, which was painful.  A colonoscopy was      recommended. Will discuss that again when she returns.   Note, Dr. Roxy Cedar records indicate that Protonix was probably helping  her.  It may be that she did not realize she needed to take that  longterm, perhaps, despite having that explained in the past, and we  will see.     Iva Boop, MD,FACG  Electronically Signed    CEG/MedQ  DD: 04/26/2007  DT: 04/27/2007  Job #:  [id number]   cc:   Clinton D. Maple Hudson, MD, FCCP, FACP

## 2010-12-16 ENCOUNTER — Telehealth: Payer: Self-pay | Admitting: Internal Medicine

## 2010-12-16 NOTE — Telephone Encounter (Signed)
Did not see pt's PNA vaccine in EPIC or EMR. Ordered chart to Katie's location. Called and informed pt we are waiting on paper chart to come. Pt states is okay to leave a message on machine simply stating "you have or you have not had it"...

## 2010-12-17 NOTE — Telephone Encounter (Signed)
I have reviewed her paper chart and do not see any Documentation for a PNA shot.

## 2010-12-17 NOTE — Telephone Encounter (Signed)
LMOM informing pt that per her paper chart she has not had the PNA vaccine.  Encouraged pt to call with any other questions / concerns.

## 2011-02-24 ENCOUNTER — Other Ambulatory Visit: Payer: Self-pay | Admitting: Internal Medicine

## 2011-05-11 ENCOUNTER — Ambulatory Visit (INDEPENDENT_AMBULATORY_CARE_PROVIDER_SITE_OTHER): Payer: Medicare Other | Admitting: Internal Medicine

## 2011-05-11 ENCOUNTER — Encounter: Payer: Self-pay | Admitting: Internal Medicine

## 2011-05-11 VITALS — BP 130/84 | HR 67 | Ht 64.25 in | Wt 130.4 lb

## 2011-05-11 DIAGNOSIS — R05 Cough: Secondary | ICD-10-CM

## 2011-05-11 DIAGNOSIS — K219 Gastro-esophageal reflux disease without esophagitis: Secondary | ICD-10-CM

## 2011-05-11 DIAGNOSIS — Z23 Encounter for immunization: Secondary | ICD-10-CM

## 2011-05-11 DIAGNOSIS — J309 Allergic rhinitis, unspecified: Secondary | ICD-10-CM

## 2011-05-11 DIAGNOSIS — R059 Cough, unspecified: Secondary | ICD-10-CM

## 2011-05-11 NOTE — Assessment & Plan Note (Signed)
Try nasal antihistamine spray.

## 2011-05-11 NOTE — Progress Notes (Signed)
05/11/11- 65 year old female former smoker followed for chronic cough, allergic rhinitis complicated by GERD LOV-03/26/2010 Her husband complains that her cough is gradually getting worse. There were recently on a long trip together and she noted less cough [place] around [facility] She says the cough annoys her husband more than her and she would ignore it if nobody else was involved. Cough is dry except productive of some clear sputum when she first gets up in the morning. It is worse if she tries to lie flat for yoga. At that time she will get a pressure sensation in the chest morning of onset of cough, and she will sit up to prevent it. Cold air makes her tighter. Came off of Singulair with no change. In the past she took double antibiotics and she thinks maybe that had helped some. She sleeps elevated with 2 pillows. Always feels postnasal drainage. Cough is worse after her shower or when she leans forward to wash her face in the sink. She feels drainage and she tilts her head. Found that she had bad heartburn if she skipped omeprazole. I explained that reflux will continue even though she doesn't feel it and protected by an acid blocker. We reviewed her history that she had had GI evaluation at [facility] with demonstrated reflux. She did not respond to allergy vaccine. 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   ROS-see HPI Constitutional:   No-   weight loss, night sweats, fevers, chills, fatigue, lassitude. HEENT:   No-  headaches, difficulty swallowing, tooth/dental problems, sore throat,       No-  sneezing, itching, ear ache, nasal congestion  + post nasal drip,  CV:  No-   chest pain, orthopnea, PND, swelling in lower extremities, anasarca, dizziness, palpitations Resp: No-   shortness of breath with exertion or at rest.              No-   productive cough,  +non-productive cough,  No- coughing up of blood.        No-   change in color of mucus.  No- wheezing.   Skin: No-   rash or lesions. GI:  Contolled - heartburn, indigestion,  No- abdominal pain, nausea, vomiting, diarrhea,                 change in bowel habits, loss of appetite GU: No-   dysuria, change in color of urine, no urgency or frequency.  No- flank pain. MS:  No-   joint pain or swelling.  No- decreased range of motion.  No- back pain. Neuro-     nothing unusual Psych:  No- change in mood or affect. No depression or anxiety.  No memory loss.  OBJ General- Alert, Oriented, Affect-appropriate, Distress- none acute Skin- rash-none, lesions- none, excoriation- none Lymphadenopathy- none Head- atraumatic            Eyes- Gross vision intact, PERRLA, conjunctivae clear secretions            Ears- Hearing, canals-normal            Nose- Clear, no-Septal dev, mucus, polyps, erosion, perforation             Throat- Mallampati III-IV , mucosa clear , drainage- none, tonsils- atrophic. Posterior pharyngeal wall difficult to visualize. Neck- flexible , trachea midline, no stridor , thyroid nl, carotid no bruit Chest - symmetrical excursion , unlabored  Heart/CV- RRR , no murmur , no gallop  , no rub, nl s1 s2                           - JVD- none , edema- none, stasis changes- none, varices- none           Lung- clear to P&A, wheeze- none, +cough- repeated dry , dullness-none, rub- none           Chest wall-  Abd- tender-no, distended-no, bowel sounds-present, HSM- no Br/ Gen/ Rectal- Not done, not indicated Extrem- cyanosis- none, clubbing, none, atrophy- none, strength- nl Neuro- grossly intact to observation

## 2011-05-11 NOTE — Assessment & Plan Note (Signed)
I explained recent study demonstrating that bland reflux into the lower third of the esophagus is sufficient to trigger nerve reflex with cough griffin from the central nervous system cough suffers. She does not need to be aspirating or feeling heartburn.  Plan-try replacing omeprazole with the antihistamine acid blocker Pepcid while continuing an antihistamine and adding a nasal antihistamine spray, for her rhinitis. Continue reflux precautions.

## 2011-05-11 NOTE — Assessment & Plan Note (Signed)
Well known to have GERD from previous workup. We're trying Pepcid just long enough to see if that antihistamine has any contributory effect on her postnasal drip. If it doesn't work as well she will return to omeprazole.

## 2011-05-11 NOTE — Patient Instructions (Addendum)
Try replacing omeprazole with an antihistamine acid blocker - famotadine 20 mg twice daily before meals  Avoid reflux situations, especially reclining with a full stomach  Sample Patanase nasal antihistamine spray  1-2 puffs each nostril, up to twice daily as needed  You can continue to take an oral antihistamine as needed for the post nasal drip- your choice.  Flu vax

## 2012-04-11 ENCOUNTER — Telehealth: Payer: Self-pay | Admitting: Internal Medicine

## 2012-04-11 NOTE — Telephone Encounter (Signed)
Called pt 3x to schd apt. Pt refuse to make apt at this time.

## 2012-05-12 ENCOUNTER — Telehealth: Payer: Self-pay | Admitting: Internal Medicine

## 2012-05-12 NOTE — Telephone Encounter (Signed)
lmomtcb x1--did not see anything documented in EMR/EPIC

## 2012-05-12 NOTE — Telephone Encounter (Signed)
Pt advised. Jennifer Castillo, CMA  

## 2012-05-23 ENCOUNTER — Ambulatory Visit (INDEPENDENT_AMBULATORY_CARE_PROVIDER_SITE_OTHER): Payer: Medicare Other | Admitting: Internal Medicine

## 2012-05-23 ENCOUNTER — Other Ambulatory Visit (INDEPENDENT_AMBULATORY_CARE_PROVIDER_SITE_OTHER): Payer: Medicare Other

## 2012-05-23 ENCOUNTER — Ambulatory Visit (INDEPENDENT_AMBULATORY_CARE_PROVIDER_SITE_OTHER)
Admission: RE | Admit: 2012-05-23 | Discharge: 2012-05-23 | Disposition: A | Discharge status: Home / Self Care | Payer: Medicare Other | Source: Ambulatory Visit | Attending: Internal Medicine | Admitting: Internal Medicine

## 2012-05-23 ENCOUNTER — Encounter: Payer: Self-pay | Admitting: Internal Medicine

## 2012-05-23 VITALS — BP 110/72 | HR 78 | Temp 98.3°F | Ht 65.0 in | Wt 129.0 lb

## 2012-05-23 DIAGNOSIS — M545 Low back pain, unspecified: Secondary | ICD-10-CM

## 2012-05-23 DIAGNOSIS — M79609 Pain in unspecified limb: Secondary | ICD-10-CM

## 2012-05-23 DIAGNOSIS — Z1211 Encounter for screening for malignant neoplasm of colon: Secondary | ICD-10-CM

## 2012-05-23 DIAGNOSIS — R05 Cough: Secondary | ICD-10-CM

## 2012-05-23 DIAGNOSIS — Z0000 Encounter for general adult medical examination without abnormal findings: Secondary | ICD-10-CM

## 2012-05-23 DIAGNOSIS — M79671 Pain in right foot: Secondary | ICD-10-CM

## 2012-05-23 DIAGNOSIS — R059 Cough, unspecified: Secondary | ICD-10-CM

## 2012-05-23 DIAGNOSIS — Z79899 Other long term (current) drug therapy: Secondary | ICD-10-CM

## 2012-05-23 DIAGNOSIS — Z136 Encounter for screening for cardiovascular disorders: Secondary | ICD-10-CM

## 2012-05-23 DIAGNOSIS — Z23 Encounter for immunization: Secondary | ICD-10-CM

## 2012-05-23 LAB — LIPID PANEL
Cholesterol: 199 mg/dL (ref 0–200)
HDL: 86.2 mg/dL (ref >39.00)
LDL Cholesterol: 101 mg/dL — ABNORMAL HIGH (ref 0–99)
Total CHOL/HDL Ratio: 2
Triglycerides: 60 mg/dL (ref 0.0–149.0)
VLDL: 12 mg/dL (ref 0.0–40.0)

## 2012-05-23 LAB — URINALYSIS, ROUTINE W REFLEX MICROSCOPIC
Nitrite: NEGATIVE
Total Protein, Urine: NEGATIVE
pH: 5 (ref 5.0–8.0)

## 2012-05-23 LAB — HEPATIC FUNCTION PANEL
ALT: 22 U/L (ref 0–35)
AST: 28 U/L (ref 0–37)
Bilirubin, Direct: 0.2 mg/dL (ref 0.0–0.3)
Total Protein: 6.8 g/dL (ref 6.0–8.3)

## 2012-05-23 LAB — CBC WITH DIFFERENTIAL/PLATELET
Basophils Absolute: 0 10*3/uL (ref 0.0–0.1)
Eosinophils Relative: 1.2 % (ref 0.0–5.0)
HCT: 41.7 % (ref 36.0–46.0)
Lymphocytes Relative: 24.2 % (ref 12.0–46.0)
Monocytes Relative: 7.6 % (ref 3.0–12.0)
Neutrophils Relative %: 66.7 % (ref 43.0–77.0)
Platelets: 246 10*3/uL (ref 150.0–400.0)
WBC: 4.7 10*3/uL (ref 4.5–10.5)

## 2012-05-23 LAB — BASIC METABOLIC PANEL
BUN: 18 mg/dL (ref 6–23)
Calcium: 9.4 mg/dL (ref 8.4–10.5)
Creatinine, Ser: 0.9 mg/dL (ref 0.4–1.2)
GFR: 64.04 mL/min (ref >60.00)
Potassium: 4.8 mEq/L (ref 3.5–5.1)

## 2012-05-23 LAB — TSH: TSH: 1.09 u[IU]/mL (ref 0.35–5.50)

## 2012-05-23 MED ORDER — NAPROXEN SODIUM 220 MG PO TABS: 220.0000 mg | ORAL_TABLET | Freq: Two times a day (BID) | ORAL | Status: DC

## 2012-05-23 NOTE — Assessment & Plan Note (Signed)
S/p extensive ENT, pulm and GI eval in past -  On daily PPI and antihistamine without change in daily dry cough symptoms ?PFTs done at [facility] >5 years ago with mild asthma per pt - Will look for prior study, and plan follow up if needed - ?LABA or steroid inhaler trial

## 2012-05-23 NOTE — Progress Notes (Signed)
Subjective:    Patient ID: Beverly [name], female    DOB: February 28, 1946, 66 y.o.   MRN: [id number]  HPI New pt to me and our division, here to establish with PCP -  has followed primarily with gyn for same until now  Here for medicare wellness  Diet: heart healthy Physical activity: active - yoga, horseback riding Depression/mood screen: negative Hearing: intact to whispered voice Visual acuity: grossly normal, performs annual eye exam (groat) ADLs: capable Fall risk: none Home safety: good Cognitive evaluation: intact to orientation, naming, recall and repetition EOL planning: adv directives, full code/ I agree  I have personally reviewed and have noted 1. The patient's medical and social history 2. Their use of alcohol, tobacco or illicit drugs 3. Their current medications and supplements 4. The patient's functional ability including ADL's, fall risks, home safety risks and hearing or visual impairment. 5. Diet and physical activities 6. Evidence for depression or mood disorders  Also reviewed chronic medical issues: Chronic cough. Ongoing greater than 10 years. Dry, no sputum associated with same. Status post ENT, pulmonary and GI evaluation for same over past 5-10 years. Denies imaging abnormalities. Question report of mild asthma, but denies being on asthma medication trial for same -takes antihistamine and PPI therapy daily  Complains of low back pain. Onset greater than 2 months ago. Pain is mild, 2/10. Pain can be temporarily relieved with over-the-counter NSAIDs. No radiation of pain into buttock or legs. Not associated with bruise or rash. Denies precipitating injury or overuse. Does not interfere with activities of daily living or hobbies such as horseback riding and yard work. Concern due to mom hx pancreatic cancer with symptomatic back pain at time of her dx.  Past Medical History  Diagnosis Date  . Allergic rhinitis   . Esophageal reflux   . Chronic cough   . IBS  (irritable bowel syndrome)    Family History  Problem Relation Age of Onset  . Pancreatic cancer Mother   . Heart disease Father   . Hypertension Brother   . Hyperlipidemia Brother   . Colon cancer Maternal Grandfather    History  Substance Use Topics  . Smoking status: Former Games developer  . Smokeless tobacco: Not on file  . Alcohol Use: Not on file    Review of Systems Constitutional: Negative for fever or weight change.  Respiratory: Negative for change in chronic cough and shortness of breath.   Cardiovascular: Negative for chest pain or palpitations.  Gastrointestinal: Negative for abdominal pain, no bowel changes.  Musculoskeletal: Negative for gait problem or joint swelling. R foot pain with WB x 6 weeks. low back pain (2/10) x 2 months, improved with Aleve prn but not resolved. Skin: Negative for rash.  Neurological: Negative for dizziness or headache.  No other specific complaints in a complete review of systems (except as listed in HPI above).     Objective:   Physical Exam BP 110/72  Pulse 78  Temp 98.3 F (36.8 C) (Oral)  Ht 5\' 5"  (1.651 m)  Wt 129 lb (58.514 kg)  BMI 21.47 kg/m2  SpO2 97% Wt Readings from Last 3 Encounters:  05/23/12 129 lb (58.514 kg)  05/11/11 130 lb 6.4 oz (59.149 kg)  03/26/10 122 lb 6.1 oz (55.512 kg)   Constitutional: She appears well-developed and well-nourished. No distress.  HENT: Head: Normocephalic and atraumatic. Sinus nontender. Ears: B TMs ok, no erythema or effusion; Nose: Nose normal. Mouth/Throat: Oropharynx is clear and moist. No oropharyngeal exudate.  Eyes:  Conjunctivae and EOM are normal. Pupils are equal, round, and reactive to light. No scleral icterus.  Neck: Normal range of motion. Neck supple. No JVD or LAD present. No thyromegaly present.  Cardiovascular: Normal rate, regular rhythm and normal heart sounds.  No murmur heard. No BLE edema. Pulmonary/Chest: Effort normal and breath sounds normal. No respiratory distress.  She has no wheezes.  Abdominal: Soft. Bowel sounds are normal. She exhibits no distension. There is no tenderness. no masses Musculoskeletal: R foot without gross abnormality. Tender along ball at 2nd MT head. Normal range of motion, no joint effusions. Back: full range of motion of thoracic and lumbar spine. Non tender to palpation. Negative straight leg raise. DTR's are symmetrically intact. Sensation intact in all dermatomes of the lower extremities. Full strength to manual muscle testing. patient is able to heel toe walk without difficulty and ambulates with normal gait. Neurological: She is alert and oriented to person, place, and time. No cranial nerve deficit. Coordination normal.  Skin: Skin is warm and dry. No rash noted. No erythema.  Psychiatric: She has a normal mood and affect. Her behavior is normal. Judgment and thought content normal.   Lab Results  Component Value Date   WBC 5.7 11/13/2009   HGB 13.2 11/13/2009   HCT 38.3 11/13/2009   PLT 230 11/13/2009   GLUCOSE 92 11/13/2009   ALT 21 11/13/2009   AST 27 11/13/2009   NA 137 11/13/2009   K 4.7 11/13/2009   CL 105 11/13/2009   CREATININE 0.78 11/13/2009   BUN 17 11/13/2009   CO2 28 11/13/2009   ZOX:WRUEA @ 73 bpm - no ST-T change or arrythmia      Assessment & Plan:  AWV/CPX/- v70.0 - Today patient counseled on age appropriate routine health concerns for screening and prevention, each reviewed and up to date or declined. Immunizations reviewed and up to date or declined. Labs/ECG reviewed. Risk factors for depression reviewed and negative. Hearing function and visual acuity are intact. ADLs screened and addressed as needed. Functional ability and level of safety reviewed and appropriate. Education, counseling and referrals performed based on assessed risks today. Patient provided with a copy of personalized plan for preventive services.  Also See problem list. Medications and labs reviewed today.  R foot pain - refer to podiatry at pt request

## 2012-05-23 NOTE — Patient Instructions (Addendum)
It was good to see you today. We have reviewed your prior records including labs and tests today Health Maintenance reviewed - immunizations for Tetnus and pneumonia done today- all recommended immunizations and age-appropriate screenings are up-to-date. we'll make referral for colonoscopy screening. Our office will contact you regarding appointment(s) once made. Also will refer to podiatry for your right foot pain Test(s) ordered today - blood and xray. Your results will be released to MyChart (or called to you) after review, usually within 72hours after test completion. If any changes need to be made, you will be notified at that same time. Take Aleve 2tab/day for next 7-10 days - additional treatment and testing on your back pain will depend on your response and imaging results Other Medications reviewed, no changes at this time.  Please schedule followup in 3-4 months to review cough, call sooner if problems. Health Maintenance, Females A healthy lifestyle and preventative care can promote health and wellness.  Maintain regular health, dental, and eye exams.   Eat a healthy diet. Foods like vegetables, fruits, whole grains, low-fat dairy products, and lean protein foods contain the nutrients you need without too many calories. Decrease your intake of foods high in solid fats, added sugars, and salt. Get information about a proper diet from your caregiver, if necessary.   Regular physical exercise is one of the most important things you can do for your health. Most adults should get at least 150 minutes of moderate-intensity exercise (any activity that increases your heart rate and causes you to sweat) each week. In addition, most adults need muscle-strengthening exercises on 2 or more days a week.     Maintain a healthy weight. The body mass index (BMI) is a screening tool to identify possible weight problems. It provides an estimate of body fat based on height and weight. Your caregiver can  help determine your BMI, and can help you achieve or maintain a healthy weight. For adults 20 years and older:   A BMI below 18.5 is considered underweight.   A BMI of 18.5 to 24.9 is normal.   A BMI of 25 to 29.9 is considered overweight.   A BMI of 30 and above is considered obese.   Maintain normal blood lipids and cholesterol by exercising and minimizing your intake of saturated fat. Eat a balanced diet with plenty of fruits and vegetables. Blood tests for lipids and cholesterol should begin at age 72 and be repeated every 5 years. If your lipid or cholesterol levels are high, you are over 50, or you are a high risk for heart disease, you may need your cholesterol levels checked more frequently. Ongoing high lipid and cholesterol levels should be treated with medicines if diet and exercise are not effective.   If you smoke, find out from your caregiver how to quit. If you do not use tobacco, do not start.   If you are pregnant, do not drink alcohol. If you are breastfeeding, be very cautious about drinking alcohol. If you are not pregnant and choose to drink alcohol, do not exceed 1 drink per day. One drink is considered to be 12 ounces (355 mL) of beer, 5 ounces (148 mL) of wine, or 1.5 ounces (44 mL) of liquor.   Avoid use of street drugs. Do not share needles with anyone. Ask for help if you need support or instructions about stopping the use of drugs.   High blood pressure causes heart disease and increases the risk of stroke. Blood pressure should  be checked at least every 1 to 2 years. Ongoing high blood pressure should be treated with medicines, if weight loss and exercise are not effective.   If you are 20 to 66 years old, ask your caregiver if you should take aspirin to prevent strokes.   Diabetes screening involves taking a blood sample to check your fasting blood sugar level. This should be done once every 3 years, after age 83, if you are within normal weight and without risk  factors for diabetes. Testing should be considered at a younger age or be carried out more frequently if you are overweight and have at least 1 risk factor for diabetes.   Breast cancer screening is essential preventative care for women. You should practice "breast self-awareness." This means understanding the normal appearance and feel of your breasts and may include breast self-examination. Any changes detected, no matter how small, should be reported to a caregiver. Women in their 60s and 30s should have a clinical breast exam (CBE) by a caregiver as part of a regular health exam every 1 to 3 years. After age 30, women should have a CBE every year. Starting at age 63, women should consider having a mammogram (breast X-ray) every year. Women who have a family history of breast cancer should talk to their caregiver about genetic screening. Women at a high risk of breast cancer should talk to their caregiver about having an MRI and a mammogram every year.   The Pap test is a screening test for cervical cancer. Women should have a Pap test starting at age 49. Between ages 37 and 77, Pap tests should be repeated every 2 years. Beginning at age 70, you should have a Pap test every 3 years as long as the past 3 Pap tests have been normal. If you had a hysterectomy for a problem that was not cancer or a condition that could lead to cancer, then you no longer need Pap tests. If you are between ages 46 and 23, and you have had normal Pap tests going back 10 years, you no longer need Pap tests. If you have had past treatment for cervical cancer or a condition that could lead to cancer, you need Pap tests and screening for cancer for at least 20 years after your treatment. If Pap tests have been discontinued, risk factors (such as a new sexual partner) need to be reassessed to determine if screening should be resumed. Some women have medical problems that increase the chance of getting cervical cancer. In these cases,  your caregiver may recommend more frequent screening and Pap tests.   The human papillomavirus (HPV) test is an additional test that may be used for cervical cancer screening. The HPV test looks for the virus that can cause the cell changes on the cervix. The cells collected during the Pap test can be tested for HPV. The HPV test could be used to screen women aged 37 years and older, and should be used in women of any age who have unclear Pap test results. After the age of 68, women should have HPV testing at the same frequency as a Pap test.   Colorectal cancer can be detected and often prevented. Most routine colorectal cancer screening begins at the age of 39 and continues through age 23. However, your caregiver may recommend screening at an earlier age if you have risk factors for colon cancer. On a yearly basis, your caregiver may provide home test kits to check for hidden blood  in the stool. Use of a small camera at the end of a tube, to directly examine the colon (sigmoidoscopy or colonoscopy), can detect the earliest forms of colorectal cancer. Talk to your caregiver about this at age 35, when routine screening begins. Direct examination of the colon should be repeated every 5 to 10 years through age 33, unless early forms of pre-cancerous polyps or small growths are found.   Hepatitis C blood testing is recommended for all people born from [birth year] through 1965 and any individual with known risks for hepatitis C.   Practice safe sex. Use condoms and avoid high-risk sexual practices to reduce the spread of sexually transmitted infections (STIs). Sexually active women aged 83 and younger should be checked for Chlamydia, which is a common sexually transmitted infection. Older women with new or multiple partners should also be tested for Chlamydia. Testing for other STIs is recommended if you are sexually active and at increased risk.   Osteoporosis is a disease in which the bones lose minerals and  strength with aging. This can result in serious bone fractures. The risk of osteoporosis can be identified using a bone density scan. Women ages 60 and over and women at risk for fractures or osteoporosis should discuss screening with their caregivers. Ask your caregiver whether you should be taking a calcium supplement or vitamin D to reduce the rate of osteoporosis.   Menopause can be associated with physical symptoms and risks. Hormone replacement therapy is available to decrease symptoms and risks. You should talk to your caregiver about whether hormone replacement therapy is right for you.   Use sunscreen with a sun protection factor (SPF) of 30 or greater. Apply sunscreen liberally and repeatedly throughout the day. You should seek shade when your shadow is shorter than you. Protect yourself by wearing long sleeves, pants, a wide-brimmed hat, and sunglasses year round, whenever you are outdoors.   Notify your caregiver of new moles or changes in moles, especially if there is a change in shape or color. Also notify your caregiver if a mole is larger than the size of a pencil eraser.   Stay current with your immunizations.  Document Released: 01/11/2011 Document Revised: 09/20/2011 Document Reviewed: 01/11/2011 [facility] Patient Information 2013 [place], [place].

## 2012-05-23 NOTE — Assessment & Plan Note (Signed)
No neuro abnormality on exam Suspect symptomatic DDD Check plain film - Tx scheduled NSAIDs for 7-10days, then prn Education provided on same - follow up if worse or change in symptoms

## 2012-08-23 ENCOUNTER — Ambulatory Visit: Payer: Medicare Other | Admitting: Internal Medicine

## 2012-09-28 ENCOUNTER — Ambulatory Visit: Payer: Medicare Other | Admitting: Internal Medicine

## 2013-02-12 ENCOUNTER — Telehealth: Payer: Self-pay | Admitting: Internal Medicine

## 2013-02-12 NOTE — Telephone Encounter (Signed)
Left message for pt to call back.  Pt states she has been taking a probiotic and her stomach is hurting now worse than prior to taking the probiotic. Pt requests to be seen. Offered pt an appt with [facility] this week but pt states she will take appt with Dr. Leone Payor for 02/23/13@2 :30pm. Pt aware of appt.

## 2013-02-14 ENCOUNTER — Other Ambulatory Visit (INDEPENDENT_AMBULATORY_CARE_PROVIDER_SITE_OTHER): Payer: Medicare Other

## 2013-02-14 ENCOUNTER — Encounter: Payer: Self-pay | Admitting: Internal Medicine

## 2013-02-14 ENCOUNTER — Ambulatory Visit (INDEPENDENT_AMBULATORY_CARE_PROVIDER_SITE_OTHER): Payer: Medicare Other | Admitting: Internal Medicine

## 2013-02-14 VITALS — BP 112/72 | HR 69 | Temp 97.5°F | Wt 128.4 lb

## 2013-02-14 DIAGNOSIS — R1032 Left lower quadrant pain: Secondary | ICD-10-CM

## 2013-02-14 DIAGNOSIS — N951 Menopausal and female climacteric states: Secondary | ICD-10-CM

## 2013-02-14 DIAGNOSIS — K589 Irritable bowel syndrome without diarrhea: Secondary | ICD-10-CM

## 2013-02-14 DIAGNOSIS — N959 Unspecified menopausal and perimenopausal disorder: Secondary | ICD-10-CM

## 2013-02-14 DIAGNOSIS — J309 Allergic rhinitis, unspecified: Secondary | ICD-10-CM

## 2013-02-14 LAB — HEPATIC FUNCTION PANEL
Albumin: 4 g/dL (ref 3.5–5.2)
Total Protein: 6.4 g/dL (ref 6.0–8.3)

## 2013-02-14 LAB — CBC WITH DIFFERENTIAL/PLATELET
Basophils Relative: 0.2 % (ref 0.0–3.0)
Eosinophils Relative: 0.7 % (ref 0.0–5.0)
HCT: 37.8 % (ref 36.0–46.0)
MCV: 92 fl (ref 78.0–100.0)
Monocytes Absolute: 0.4 10*3/uL (ref 0.1–1.0)
Monocytes Relative: 7.3 % (ref 3.0–12.0)
Neutrophils Relative %: 68.6 % (ref 43.0–77.0)
RBC: 4.11 Mil/uL (ref 3.87–5.11)
WBC: 4.9 10*3/uL (ref 4.5–10.5)

## 2013-02-14 LAB — URINALYSIS, ROUTINE W REFLEX MICROSCOPIC
Nitrite: NEGATIVE
Total Protein, Urine: NEGATIVE
Urine Glucose: NEGATIVE
pH: 5.5 (ref 5.0–8.0)

## 2013-02-14 LAB — BASIC METABOLIC PANEL
Chloride: 105 mEq/L (ref 96–112)
Creatinine, Ser: 1 mg/dL (ref 0.4–1.2)
GFR: 60.86 mL/min (ref >60.00)
Potassium: 4.9 mEq/L (ref 3.5–5.1)

## 2013-02-14 MED ORDER — FLUTICASONE PROPIONATE 50 MCG/ACT NA SUSP: 2.0000 | Freq: Every day | NASAL | Status: DC

## 2013-02-14 MED ORDER — DICYCLOMINE HCL 10 MG PO CAPS: 10.0000 mg | ORAL_CAPSULE | Freq: Two times a day (BID) | ORAL | Status: DC | PRN

## 2013-02-14 NOTE — Patient Instructions (Signed)
It was good to see you today. We have reviewed your prior records including labs and tests today Test(s) ordered today. Your results will be released to MyChart (or called to you) after review, usually within 72hours after test completion. If any changes need to be made, you will be notified at that same time. Medications reviewed and updated, continue your probiotic daily on empty stomach, use Bentyl as needed for cramping and start Flonase spray daily for allergy control in addition to Zyrtec Your prescription(s) have been submitted to your pharmacy. Please take as directed and contact our office if you believe you are having problem(s) with the medication(s).  reschedule your bone density scan as discussed -We'll call you regarding these results Followup for annual physical in the fall, call sooner if problems

## 2013-02-14 NOTE — Assessment & Plan Note (Signed)
Seasonal symptoms Add flonase to ongoing antihistamine

## 2013-02-14 NOTE — Progress Notes (Signed)
  Subjective:    Patient ID: Beverly [name], female    DOB: [birth year], 68 y.o.   MRN: [id number]  HPI  complains of llq discomfort Ongoing 3 weeks, worse in past 4 days Similar to prior ibs flares: gas, bloating, cramping and diarrhea ?precipitated by emotional stess and travel No fever, nausea and vomiting, brbpr or melena Taking probiotics, change in brand 3 weeks ago  Past Medical History  Diagnosis Date  . Allergic rhinitis   . Esophageal reflux   . Chronic cough   . IBS (irritable bowel syndrome)      Review of Systems  Constitutional: Positive for fatigue. Negative for fever, activity change and appetite change.  HENT: Positive for rhinorrhea, sneezing, postnasal drip and sinus pressure.   Respiratory: Positive for cough (chronic). Negative for shortness of breath.   Gastrointestinal: Positive for abdominal pain and abdominal distention. Negative for nausea, vomiting, diarrhea, constipation, blood in stool and rectal pain.  Genitourinary: Negative for dysuria, frequency, hematuria, flank pain, vaginal bleeding, difficulty urinating, menstrual problem and pelvic pain.  Allergic/Immunologic: Positive for environmental allergies. Negative for food allergies and immunocompromised state.       Objective:   Physical Exam BP 112/72  Pulse 69  Temp(Src) 97.5 F (36.4 C) (Oral)  Wt 128 lb 6.4 oz (58.242 kg)  BMI 21.37 kg/m2  SpO2 96% Wt Readings from Last 3 Encounters:  02/14/13 128 lb 6.4 oz (58.242 kg)  05/23/12 129 lb (58.514 kg)  05/11/11 130 lb 6.4 oz (59.149 kg)   Constitutional: She appears well-developed and well-nourished. No distress. nontoxic Eyes: Conjunctivae and EOM are normal. Pupils are equal, round, and reactive to light. No scleral icterus.  Neck: Normal range of motion. Neck supple. No JVD present. No thyromegaly present.  Cardiovascular: Normal rate, regular rhythm and normal heart sounds.  No murmur heard. No BLE edema. Pulmonary/Chest: Effort normal  and breath sounds normal. No respiratory distress. She has no wheezes.  Abdominal: Soft. Bowel sounds are normal. She exhibits no distension. There is min tenderness over LLQ, no r/g. no masses Skin: Skin is warm and dry. No rash noted. No erythema.  Psychiatric: She has a normal mood and affect. Her behavior is normal. Judgment and thought content normal.   Lab Results  Component Value Date   WBC 4.7 05/23/2012   HGB 14.0 05/23/2012   HCT 41.7 05/23/2012   PLT 246.0 05/23/2012   GLUCOSE 89 05/23/2012   CHOL 199 05/23/2012   TRIG 60.0 05/23/2012   HDL 86.20 05/23/2012   LDLCALC 101* 05/23/2012   ALT 22 05/23/2012   AST 28 05/23/2012   NA 139 05/23/2012   K 4.8 05/23/2012   CL 104 05/23/2012   CREATININE 0.9 05/23/2012   BUN 18 05/23/2012   CO2 26 05/23/2012   TSH 1.09 05/23/2012       Assessment & Plan:   LLQ pain - exacerbated underlying IBS Check labs and UA Bentyl prn in addition to probiotics Diet choices reviewed and reassurance provided Also encouraged GI follow up for colo screening (overdue)

## 2013-02-15 ENCOUNTER — Telehealth: Payer: Self-pay | Admitting: Internal Medicine

## 2013-02-15 ENCOUNTER — Ambulatory Visit (INDEPENDENT_AMBULATORY_CARE_PROVIDER_SITE_OTHER)
Admission: RE | Admit: 2013-02-15 | Discharge: 2013-02-15 | Disposition: A | Discharge status: Home / Self Care | Payer: Medicare Other | Source: Ambulatory Visit | Attending: Internal Medicine | Admitting: Internal Medicine

## 2013-02-15 DIAGNOSIS — N959 Unspecified menopausal and perimenopausal disorder: Secondary | ICD-10-CM

## 2013-02-15 DIAGNOSIS — N951 Menopausal and female climacteric states: Secondary | ICD-10-CM | POA: Insufficient documentation

## 2013-02-15 NOTE — Telephone Encounter (Signed)
Pt called and states she saw her PCP and does not need the OV with Dr. Leone Payor. Pt wants this appt cancelled and she wants to scheduled a colon. Discussed with pt that Dr. Leone Payor needs to review her chart and let us know if she can be a direct colon or if she needs an OV. Pt does not feel like she needs an OV. Explained office procedure to pt and that we would call her on Monday after Dr. Leone Payor had a chance to review her chart and let us know if she can be a direct colon or not. OV cancelled for now. Dr. Leone Payor please advise.

## 2013-02-15 NOTE — Assessment & Plan Note (Signed)
Arrange for dexa as never done and on daily chronic ppi

## 2013-02-19 NOTE — Telephone Encounter (Signed)
I agree with Ms. Davene Costain (I have reviewed chart)  She has IBS  She may cancel OV and have pre-visit to arrange a screening colonoscopy (V76.51)

## 2013-02-20 ENCOUNTER — Telehealth: Payer: Self-pay | Admitting: Internal Medicine

## 2013-02-20 NOTE — Telephone Encounter (Signed)
See phone note from 02/15/13 for details

## 2013-02-20 NOTE — Telephone Encounter (Signed)
Patient is scheduled for colon 03/01/13 and pre-visit for 02/21/13

## 2013-02-21 ENCOUNTER — Encounter: Payer: Self-pay | Admitting: Internal Medicine

## 2013-02-21 ENCOUNTER — Ambulatory Visit (AMBULATORY_SURGERY_CENTER): Payer: Medicare Other | Admitting: *Deleted

## 2013-02-21 VITALS — Ht 65.0 in | Wt 128.0 lb

## 2013-02-21 DIAGNOSIS — Z1211 Encounter for screening for malignant neoplasm of colon: Secondary | ICD-10-CM

## 2013-02-21 MED ORDER — NA SULFATE-K SULFATE-MG SULF 17.5-3.13-1.6 GM/177ML PO SOLN: ORAL | Status: DC

## 2013-02-21 NOTE — Progress Notes (Signed)
Patient denies any allergies to eggs or soy. Patient denies any problems with anesthesia.  

## 2013-02-23 ENCOUNTER — Ambulatory Visit: Payer: Medicare Other | Admitting: Internal Medicine

## 2013-03-01 ENCOUNTER — Encounter: Payer: Self-pay | Admitting: Internal Medicine

## 2013-03-01 ENCOUNTER — Ambulatory Visit (AMBULATORY_SURGERY_CENTER): Payer: Medicare Other | Admitting: Internal Medicine

## 2013-03-01 VITALS — BP 109/65 | HR 60 | Temp 97.0°F | Resp 21 | Ht 65.0 in | Wt 128.0 lb

## 2013-03-01 DIAGNOSIS — Z1211 Encounter for screening for malignant neoplasm of colon: Secondary | ICD-10-CM

## 2013-03-01 MED ORDER — SODIUM CHLORIDE 0.9 % IV SOLN: 500.0000 mL | INTRAVENOUS | Status: DC

## 2013-03-01 NOTE — Progress Notes (Deleted)

## 2013-03-01 NOTE — Progress Notes (Signed)
Patient did not experience any of the following events: a burn prior to discharge; a fall within the facility; wrong site/side/patient/procedure/implant event; or a hospital transfer or hospital admission upon discharge from the facility. (G8907)Patient did not have preoperative order for IV antibiotic SSI prophylaxis. (G8918)    No complaints noted in the recovery room. Maw   

## 2013-03-01 NOTE — Patient Instructions (Addendum)
The colonoscopy was normal - no polyps or cancer seen.  Please call and schedule an appointment to see me about your Irritable Bowel Syndrome.  Next routine colonoscopy recommended for 2024.  I appreciate the opportunity to care for you. Iva Boop, MD, FACG    YOU HAD AN ENDOSCOPIC PROCEDURE TODAY AT THE [facility]: Refer to the procedure report that was given to you for any specific questions about what was found during the examination.  If the procedure report does not answer your questions, please call your gastroenterologist to clarify.  If you requested that your care partner not be given the details of your procedure findings, then the procedure report has been included in a sealed envelope for you to review at your convenience later.  YOU SHOULD EXPECT: Some feelings of bloating in the abdomen. Passage of more gas than usual.  Walking can help get rid of the air that was put into your GI tract during the procedure and reduce the bloating. If you had a lower endoscopy (such as a colonoscopy or flexible sigmoidoscopy) you may notice spotting of blood in your stool or on the toilet paper. If you underwent a bowel prep for your procedure, then you may not have a normal bowel movement for a few days.  DIET: Your first meal following the procedure should be a light meal and then it is ok to progress to your normal diet.  A half-sandwich or bowl of soup is an example of a good first meal.  Heavy or fried foods are harder to digest and may make you feel nauseous or bloated.  Likewise meals heavy in dairy and vegetables can cause extra gas to form and this can also increase the bloating.  Drink plenty of fluids but you should avoid alcoholic beverages for 24 hours.  ACTIVITY: Your care partner should take you home directly after the procedure.  You should plan to take it easy, moving slowly for the rest of the day.  You can resume normal activity the day after the procedure however  you should NOT DRIVE or use heavy machinery for 24 hours (because of the sedation medicines used during the test).    SYMPTOMS TO REPORT IMMEDIATELY: A gastroenterologist can be reached at any hour.  During normal business hours, 8:30 AM to 5:00 PM Monday through Friday, call [phone].  After hours and on weekends, please call the GI answering service at [phone] who will take a message and have the physician on call contact you.   Following lower endoscopy (colonoscopy or flexible sigmoidoscopy):  Excessive amounts of blood in the stool  Significant tenderness or worsening of abdominal pains  Swelling of the abdomen that is new, acute  Fever of 100F or higher   FOLLOW UP: If any biopsies were taken you will be contacted by phone or by letter within the next 1-3 weeks.  Call your gastroenterologist if you have not heard about the biopsies in 3 weeks.  Our staff will call the home number listed on your records the next business day following your procedure to check on you and address any questions or concerns that you may have at that time regarding the information given to you following your procedure. This is a courtesy call and so if there is no answer at the home number and we have not heard from you through the emergency physician on call, we will assume that you have returned to your regular daily activities without incident.  SIGNATURES/CONFIDENTIALITY: You and/or your care partner have signed paperwork which will be entered into your electronic medical record.  These signatures attest to the fact that that the information above on your After Visit Summary has been reviewed and is understood.  Full responsibility of the confidentiality of this discharge information lies with you and/or your care-partner.

## 2013-03-01 NOTE — Op Note (Signed)
[facility] [address] [place], [address]   COLONOSCOPY PROCEDURE REPORT  PATIENT: Beverly [name], Beverly [name]  MR#: [id number] BIRTHDATE: [birth year] , 67  yrs. old GENDER: Female ENDOSCOPIST: Iva Boop, MD, [facility] PROCEDURE DATE:  03/01/2013 PROCEDURE:   Colonoscopy, screening First Screening Colonoscopy - Avg.  risk and is 50 yrs.  old or older Yes.  Prior Negative Screening - Now for repeat screening. N/A  History of Adenoma - Now for follow-up colonoscopy & has been > or = to 3 yrs.  N/A  Polyps Removed Today? No.  Recommend repeat exam, <10 yrs? No. ASA CLASS:   Class II INDICATIONS:average risk screening and first colonoscopy. MEDICATIONS: propofol (Diprivan) 200mg  IV, MAC sedation, administered by CRNA, and These medications were titrated to patient response per physician's verbal order  DESCRIPTION OF PROCEDURE:   After the risks benefits and alternatives of the procedure were thoroughly explained, informed consent was obtained.  A digital rectal exam revealed no abnormalities of the rectum.   The [facility] [id number]  endoscope was introduced through the anus and advanced to the cecum, which was identified by both the appendix and ileocecal valve. No adverse events experienced.   The quality of the prep was excellent using Suprep  The instrument was then slowly withdrawn as the colon was fully examined.      COLON FINDINGS: A normal appearing cecum, ileocecal valve, and appendiceal orifice were identified.  The ascending, hepatic flexure, transverse, splenic flexure, descending, sigmoid colon and rectum appeared unremarkable.  No polyps or cancers were seen.   A right colon retroflexion was performed.  Retroflexed views revealed no abnormalities. The time to cecum=2 minutes 20 seconds. Withdrawal time=11 minutes 24 seconds.  The scope was withdrawn and the procedure completed. COMPLICATIONS: There were no complications.  ENDOSCOPIC  IMPRESSION: Normal colonoscopy - excellent prep - first colonoscopy  RECOMMENDATIONS: Repeat Colonscopy in 10 years for rouytine screening - 2024. Call office to make an appointment - re: IBS.   eSigned:  Iva Boop, MD, [facility] 03/01/2013 10:55 AM   cc: Newt Lukes, MD and The Patient

## 2013-03-02 ENCOUNTER — Telehealth: Payer: Self-pay | Admitting: *Deleted

## 2013-03-02 NOTE — Telephone Encounter (Signed)
  Follow up Call-  Call back number 03/01/2013  Post procedure Call Back phone  # [phone]  Permission to leave phone message Yes     Patient questions:  Do you have a fever, pain , or abdominal swelling? no Pain Score  0 *  Have you tolerated food without any problems? yes  Have you been able to return to your normal activities? yes  Do you have any questions about your discharge instructions: Diet   no Medications  no Follow up visit  no  Do you have questions or concerns about your Care? no  Actions: * If pain score is 4 or above: No action needed, pain <4.

## 2013-03-14 ENCOUNTER — Encounter: Payer: Self-pay | Admitting: Internal Medicine

## 2013-03-14 DIAGNOSIS — M810 Age-related osteoporosis without current pathological fracture: Secondary | ICD-10-CM | POA: Insufficient documentation

## 2013-03-14 DIAGNOSIS — M8580 Other specified disorders of bone density and structure, unspecified site: Secondary | ICD-10-CM

## 2013-03-14 HISTORY — DX: Other specified disorders of bone density and structure, unspecified site: M85.80

## 2013-04-04 ENCOUNTER — Ambulatory Visit (INDEPENDENT_AMBULATORY_CARE_PROVIDER_SITE_OTHER): Payer: Medicare Other | Admitting: Internal Medicine

## 2013-04-04 ENCOUNTER — Encounter: Payer: Self-pay | Admitting: Internal Medicine

## 2013-04-04 VITALS — BP 100/60 | HR 62 | Ht 65.0 in | Wt 130.4 lb

## 2013-04-04 DIAGNOSIS — R05 Cough: Secondary | ICD-10-CM

## 2013-04-04 DIAGNOSIS — K219 Gastro-esophageal reflux disease without esophagitis: Secondary | ICD-10-CM

## 2013-04-04 DIAGNOSIS — R059 Cough, unspecified: Secondary | ICD-10-CM

## 2013-04-04 DIAGNOSIS — K589 Irritable bowel syndrome without diarrhea: Secondary | ICD-10-CM

## 2013-04-04 MED ORDER — OMEPRAZOLE 20 MG PO CPDR: 20.0000 mg | DELAYED_RELEASE_CAPSULE | Freq: Two times a day (BID) | ORAL | Status: DC

## 2013-04-04 NOTE — Patient Instructions (Addendum)
Change your Prilosec to 20 mg one tablet twice a day.  This rx has been sent in to [facility] on [address].  Hopefully it will be less expensive to get rx than to get it OTC.    Continue your Probiotics.  Follow up with [place] as needed.  I appreciate the opportunity to care for you.

## 2013-04-05 NOTE — Assessment & Plan Note (Signed)
This could be in part due to some reflux though postnasal drainage issues and things like that or more likely in children with GERD as opposed to adult. However acid reflux contribute to cough at times by triggering neural reflexes just with ascitic fluid having the esophageal mucosa and triggering this is a postal microaspiration and exposure of acid to the larynx which also occurs.

## 2013-04-05 NOTE — Assessment & Plan Note (Signed)
Thought to be contributing to her cough, it is possible but not proven. We'll try twice a day omeprazole 20 mg.

## 2013-04-05 NOTE — Assessment & Plan Note (Signed)
Improved with the addition of a probiotic. We discussed the rationale for that. She will continue this. She will see me as needed.

## 2013-04-05 NOTE — Progress Notes (Signed)
Subjective:    Patient ID: Beverly [name], female    DOB: 1946-02-20, 67 y.o.   MRN: [id number]  HPI Patient is a very pleasant lady seen for screening colonoscopy recently. She discussed some problems with IBS and loose stools at that time a followup was recommended. She had started a probiotic which improved her symptoms of bloating, flatulence and loose stools tremendously. She feels like that helps quite a bit. Has a cough, those dry oral mildly productive at best it is intermittent but problematic over the years as it continues to come and go. Allergy season might make it worse. 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 She is only taking 20 mg omeprazole. She awakens at night and is disturbed with coughing. She uses Flonase nasal spray, Zyrtec at night and Claritin during the day. No Known Allergies Outpatient Prescriptions Prior to Visit  Medication Sig Dispense Refill  . cetirizine (ZYRTEC) 10 MG tablet Take 10 mg by mouth at bedtime.        . dicyclomine (BENTYL) 10 MG capsule Take 1 capsule (10 mg total) by mouth 3 times/day as needed-between meals & bedtime (cramping).  60 capsule  0  . estradiol-norethindrone (ACTIVELLA) 1-0.5 MG per tablet Take 1 tablet by mouth every other day.      . fluticasone (FLONASE) 50 MCG/ACT nasal spray Place 2 sprays into the nose daily.  16 g  2  . Glucosamine-Chondroitin 1500-1200 MG/30ML LIQD Take by mouth daily.        . Loperamide-Simethicone (IMODIUM ADVANCED) 2-125 MG TABS Take 1 tablet by mouth daily as needed.        . medroxyPROGESTERone (PROVERA) 2.5 MG tablet       . Multiple Vitamin (MULTIVITAMIN) tablet Take 1 tablet by mouth daily.        . naproxen sodium (ALEVE) 220 MG tablet Take 1 tablet (220 mg total) by mouth 2 (two) times daily with a meal.      . NON FORMULARY 180 mg. Allergy Relief take 1 tablet daily      . omeprazole (PRILOSEC) 20 MG capsule Take 20 mg by mouth daily.         No facility-administered  medications prior to visit.   Past Medical History  Diagnosis Date  . Allergic rhinitis   . Esophageal reflux   . Chronic cough   . IBS (irritable bowel syndrome)   . Osteopenia 03/14/2013    DEXA @[facility]  02/2013: -1.8   Past Surgical History  Procedure Laterality Date  . Foot surgery      right  . Tubal ligation    . Breast lumpectomy      bilateral  . Laparoscopic cholecystectomy  11/2009   History   Social History  . Marital Status: Married    Spouse Name: N/A    Number of Children: N/A  . Years of Education: N/A   Social History Main Topics  . Smoking status: Never Smoker   . Smokeless tobacco: Never Used  . Alcohol Use: 4.2 oz/week    7 Glasses of wine per week  . Drug Use: No    Family History  Problem Relation Age of Onset  . Pancreatic cancer Mother   . Heart disease Father   . Hypertension Brother   . Hyperlipidemia Brother   . Colon cancer Maternal Grandfather 85        Review of Systems As above    Objective:   Physical Exam  Well-developed well-nourished no acute distress       Assessment & Plan:

## 2013-04-26 ENCOUNTER — Ambulatory Visit: Payer: Medicare Other

## 2013-04-30 ENCOUNTER — Telehealth: Payer: Self-pay | Admitting: Internal Medicine

## 2013-04-30 MED ORDER — FLUTICASONE PROPIONATE 50 MCG/ACT NA SUSP: 2.0000 | Freq: Every day | NASAL | Status: DC

## 2013-04-30 NOTE — Telephone Encounter (Signed)
Patient requesting 3 month supply of Fluticasone be sent to her mail order pharmacy

## 2013-04-30 NOTE — Telephone Encounter (Signed)
Called pt to verify which mail service she use. Pt use [facility] mail sent to mail service that was given...lmb

## 2013-07-18 ENCOUNTER — Other Ambulatory Visit: Payer: Self-pay | Admitting: Obstetrics and Gynecology

## 2013-07-18 DIAGNOSIS — Z01419 Encounter for gynecological examination (general) (routine) without abnormal findings: Secondary | ICD-10-CM | POA: Diagnosis not present

## 2013-07-18 DIAGNOSIS — Z1231 Encounter for screening mammogram for malignant neoplasm of breast: Secondary | ICD-10-CM | POA: Diagnosis not present

## 2013-07-18 DIAGNOSIS — Z124 Encounter for screening for malignant neoplasm of cervix: Secondary | ICD-10-CM | POA: Diagnosis not present

## 2013-08-17 ENCOUNTER — Telehealth: Payer: Self-pay | Admitting: Internal Medicine

## 2013-08-17 MED ORDER — FLUTICASONE PROPIONATE 50 MCG/ACT NA SUSP: 2.0000 | Freq: Every day | NASAL | Status: DC

## 2013-08-17 NOTE — Telephone Encounter (Signed)
Faxed to [facility]...Raechel Chute/lmb

## 2013-08-17 NOTE — Telephone Encounter (Signed)
Pt request written Rx for Flonase 50 MCG. [facility] and please fax rx to [phone], pt stated to push option 3 when fax. Please call pt with any question.

## 2013-08-20 ENCOUNTER — Encounter: Payer: Self-pay | Admitting: Internal Medicine

## 2013-08-20 ENCOUNTER — Ambulatory Visit (INDEPENDENT_AMBULATORY_CARE_PROVIDER_SITE_OTHER): Payer: Medicare Other | Admitting: Internal Medicine

## 2013-08-20 VITALS — BP 112/82 | HR 75 | Temp 99.7°F | Wt 134.4 lb

## 2013-08-20 DIAGNOSIS — B029 Zoster without complications: Secondary | ICD-10-CM

## 2013-08-20 DIAGNOSIS — B0230 Zoster ocular disease, unspecified: Secondary | ICD-10-CM

## 2013-08-20 DIAGNOSIS — B0239 Other herpes zoster eye disease: Secondary | ICD-10-CM

## 2013-08-20 MED ORDER — VALACYCLOVIR HCL 1 G PO TABS
1000.0000 mg | ORAL_TABLET | Freq: Three times a day (TID) | ORAL | Status: DC
Start: 2013-08-20 — End: 2014-06-10

## 2013-08-20 MED ORDER — PHENYLEPH-PROMETHAZINE-COD 5-6.25-10 MG/5ML PO SYRP: 5.0000 mL | ORAL_SOLUTION | ORAL | Status: DC | PRN

## 2013-08-20 MED ORDER — DOXYCYCLINE HYCLATE 100 MG PO TABS
100.0000 mg | ORAL_TABLET | Freq: Two times a day (BID) | ORAL | Status: DC
Start: 2013-08-20 — End: 2014-04-10

## 2013-08-20 NOTE — Patient Instructions (Addendum)
It was good to see you today.  Will treat your symptoms with Valtrex 3 times daily times one week to cover for shingles  Also use promethazine VC with codeine syrup to help treat cough and pain symptoms  Your prescription(s) have been submitted to your pharmacy. Please take as directed and contact our office if you believe you are having problem(s) with the medication(s).  Please followup in 72 hours if symptoms worse or unimproved  Shingles Shingles (herpes zoster) is an infection that is caused by the same virus that causes chickenpox (varicella). The infection causes a painful skin rash and fluid-filled blisters, which eventually break open, crust over, and heal. It may occur in any area of the body, but it usually affects only one side of the body or face. The pain of shingles usually lasts about 1 month. However, some people with shingles may develop long-term (chronic) pain in the affected area of the body. Shingles often occurs many years after the person had chickenpox. It is more common:  In people older than 50 years.  In people with weakened immune systems, such as those with HIV, AIDS, or cancer.  In people taking medicines that weaken the immune system, such as transplant medicines.  In people under great stress. CAUSES  Shingles is caused by the varicella zoster virus (VZV), which also causes chickenpox. After a person is infected with the virus, it can remain in the person's body for years in an inactive state (dormant). To cause shingles, the virus reactivates and breaks out as an infection in a nerve root. The virus can be spread from person to person (contagious) through contact with open blisters of the shingles rash. It will only spread to people who have not had chickenpox. When these people are exposed to the virus, they may develop chickenpox. They will not develop shingles. Once the blisters scab over, the person is no longer contagious and cannot spread the virus to  others. SYMPTOMS  Shingles shows up in stages. The initial symptoms may be pain, itching, and tingling in an area of the skin. This pain is usually described as burning, stabbing, or throbbing.In a few days or weeks, a painful red rash will appear in the area where the pain, itching, and tingling were felt. The rash is usually on one side of the body in a band or belt-like pattern. Then, the rash usually turns into fluid-filled blisters. They will scab over and dry up in approximately 2 3 weeks. Flu-like symptoms may also occur with the initial symptoms, the rash, or the blisters. These may include:  Fever.  Chills.  Headache.  Upset stomach. DIAGNOSIS  Your caregiver will perform a skin exam to diagnose shingles. Skin scrapings or fluid samples may also be taken from the blisters. This sample will be examined under a microscope or sent to a lab for further testing. TREATMENT  There is no specific cure for shingles. Your caregiver will likely prescribe medicines to help you manage the pain, recover faster, and avoid long-term problems. This may include antiviral drugs, anti-inflammatory drugs, and pain medicines. HOME CARE INSTRUCTIONS   Take a cool bath or apply cool compresses to the area of the rash or blisters as directed. This may help with the pain and itching.   Only take over-the-counter or prescription medicines as directed by your caregiver.   Rest as directed by your caregiver.  Keep your rash and blisters clean with mild soap and cool water or as directed by your caregiver.  Do not pick your blisters or scratch your rash. Apply an anti-itch cream or numbing creams to the affected area as directed by your caregiver.  Keep your shingles rash covered with a loose bandage (dressing).  Avoid skin contact with:  Babies.   Pregnant women.   Children with eczema.   Elderly people with transplants.   People with chronic illnesses, such as leukemia or AIDS.   Wear  loose-fitting clothing to help ease the pain of material rubbing against the rash.  Keep all follow-up appointments with your caregiver.If the area involved is on your face, you may receive a referral for follow-up to a specialist, such as an eye doctor (ophthalmologist) or an ear, nose, and throat (ENT) doctor. Keeping all follow-up appointments will help you avoid eye complications, chronic pain, or disability.  SEEK IMMEDIATE MEDICAL CARE IF:   You have facial pain, pain around the eye area, or loss of feeling on one side of your face.  You have ear pain or ringing in your ear.  You have loss of taste.  Your pain is not relieved with prescribed medicines.   Your redness or swelling spreads.   You have more pain and swelling.  Your condition is worsening or has changed.   You have a feveror persistent symptoms for more than 2 3 days.  You have a fever and your symptoms suddenly get worse. MAKE SURE YOU:  Understand these instructions.  Will watch your condition.  Will get help right away if you are not doing well or get worse. Document Released: 06/28/2005 Document Revised: 03/22/2012 Document Reviewed: 02/10/2012 [facility] Patient Information 2014 [place], [place].

## 2013-08-20 NOTE — Progress Notes (Signed)
Pre-visit discussion using our clinic review tool. No additional management support is needed unless otherwise documented below in the visit note.  

## 2013-08-20 NOTE — Progress Notes (Signed)
    Subjective:    Patient ID: Beverly [name], female    DOB: 10/13/1945, 68 y.o.   MRN: [id number]  HPI  Complains of facial swelling over left forehead around eye Onset in past 36 hours, progressive rash and redness since onset Preceded by mild headache and itching sensation Redness of her forehead, not associated with swelling of skin around the eye and increasing redness under eye and side of face Denies visual changes, but mild matting of the left eye this morning  Past Medical History  Diagnosis Date  . Allergic rhinitis   . Esophageal reflux   . Chronic cough   . IBS (irritable bowel syndrome)   . Osteopenia 03/14/2013    DEXA @[facility]  02/2013: -1.8     Review of Systems  Constitutional: Positive for fever and fatigue.  Eyes: Negative for photophobia, pain, redness, itching and visual disturbance.  Respiratory: Positive for cough (chronic). Negative for shortness of breath.   Cardiovascular: Negative for chest pain and leg swelling.  Neurological: Positive for headaches. Negative for weakness.       Objective:   Physical Exam BP 112/82  Pulse 75  Temp(Src) 99.7 F (37.6 C) (Oral)  Wt 134 lb 6.4 oz (60.963 kg)  SpO2 97% Wt Readings from Last 3 Encounters:  08/20/13 134 lb 6.4 oz (60.963 kg)  04/04/13 130 lb 6 oz (59.138 kg)  03/01/13 128 lb (58.06 kg)   Constitutional: She appears well-developed and well-nourished. No distress. nontoxic Eyes: Conjunctivae and EOM are normal. Periorbital edema with eyelid swelling over left side. Pupils are equal, round, and reactive to light. No scleral icterus.  Neck: Normal range of motion. Neck supple.  Mild anterior left-sided LAD.No JVD present. No thyromegaly present.  Cardiovascular: Normal rate, regular rhythm and normal heart sounds.  No murmur heard. No BLE edema. Pulmonary/Chest: Effort normal and breath sounds normal. No respiratory distress. She has no wheezes.  Skin: confluent erythema with soft tissue swelling left-sided  forehead, periorbital region and V2 distribution, but no evidence of this time for vesicles - remaining skin is warm and dry. No rash noted. No erythema.  Psychiatric: She has a normal mood and affect. Her behavior is normal. Judgment and thought content normal.   Lab Results  Component Value Date   WBC 4.9 02/14/2013   HGB 12.8 02/14/2013   HCT 37.8 02/14/2013   PLT 250.0 02/14/2013   GLUCOSE 101* 02/14/2013   CHOL 199 05/23/2012   TRIG 60.0 05/23/2012   HDL 86.20 05/23/2012   LDLCALC 101* 05/23/2012   ALT 17 02/14/2013   AST 21 02/14/2013   NA 141 02/14/2013   K 4.9 02/14/2013   CL 105 02/14/2013   CREATININE 1.0 02/14/2013   BUN 23 02/14/2013   CO2 29 02/14/2013   TSH 1.09 05/23/2012       Assessment & Plan:   Periorbital itch, erythema and soft tissue swelling on left side  Suspect herpetic given dermatomal distribution (despite administration of prior shingles vaccination)  Treat with Valtrex Will also cover for empiric cellulitis with doxycycline, but patient advised to discontinue doxycycline if vesicles develop in next 48 hours  Pain control with codeine in the form of Promethazine VC given chronic cough

## 2013-08-23 ENCOUNTER — Encounter: Payer: Self-pay | Admitting: Internal Medicine

## 2013-08-24 ENCOUNTER — Ambulatory Visit: Payer: Medicare Other | Admitting: Internal Medicine

## 2013-08-24 DIAGNOSIS — L259 Unspecified contact dermatitis, unspecified cause: Secondary | ICD-10-CM | POA: Diagnosis not present

## 2013-08-31 ENCOUNTER — Telehealth: Payer: Self-pay | Admitting: *Deleted

## 2013-08-31 NOTE — Telephone Encounter (Signed)
[phone] (home) [phone] (work) I spoke with pt - she was seen 2/13 by her derm Dr Antonieta LovelessL Lomax for B symptoms - treated with pred taper for contact dermatitis and advised to Continue doxycycline course to treat secondary cellulitis. Stopped the Valtrex as not consistent with shingles at that time (2/13). In the interval week since, patient reports symptoms have much improved, but not completely resolved. I expressed appreciation at the update. Notation in chart here as requested

## 2013-08-31 NOTE — Telephone Encounter (Signed)
Patient phoned requesting that PCP remove the shingles dx off of her history list on her mychart.  States she obtained a second opinion and DID NOT have the shingles & would like it removed from her history.  CB# [phone]

## 2014-01-31 ENCOUNTER — Other Ambulatory Visit: Payer: Self-pay | Admitting: Internal Medicine

## 2014-03-13 ENCOUNTER — Encounter: Payer: Self-pay | Admitting: Family Medicine

## 2014-03-13 ENCOUNTER — Ambulatory Visit (INDEPENDENT_AMBULATORY_CARE_PROVIDER_SITE_OTHER): Payer: Medicare Other | Admitting: Family Medicine

## 2014-03-13 VITALS — BP 108/70 | HR 75 | Ht 65.0 in | Wt 129.0 lb

## 2014-03-13 DIAGNOSIS — M545 Low back pain, unspecified: Secondary | ICD-10-CM

## 2014-03-13 DIAGNOSIS — M899 Disorder of bone, unspecified: Secondary | ICD-10-CM

## 2014-03-13 DIAGNOSIS — M999 Biomechanical lesion, unspecified: Secondary | ICD-10-CM | POA: Insufficient documentation

## 2014-03-13 DIAGNOSIS — M949 Disorder of cartilage, unspecified: Secondary | ICD-10-CM

## 2014-03-13 DIAGNOSIS — M8580 Other specified disorders of bone density and structure, unspecified site: Secondary | ICD-10-CM

## 2014-03-13 MED ORDER — TRAMADOL HCL 50 MG PO TABS: 50.0000 mg | ORAL_TABLET | Freq: Every evening | ORAL | Status: DC | PRN

## 2014-03-13 NOTE — Assessment & Plan Note (Signed)
Decision today to treat with OMT was based on Physical Exam  After verbal consent patient was treated with HVLA, muscle energy techniques in thoracic, lumbar and sacral areas  Patient tolerated the procedure well with improvement in symptoms  Patient given exercises, stretches and lifestyle modifications  See medications in patient instructions if given  Patient will follow up in 3 weeks      

## 2014-03-13 NOTE — Patient Instructions (Signed)
Very nice to meet you Ice 20 minutes 2 times daily. Usually after activity and before bed. Exercises 3 times a week.  On wall heels, butt, shoulder and head touching for goal of 5 minutes daily.  Consider new mattress Take tylenol 650 mg three times a day is the best evidence based medicine we have for arthritis.  Glucosamine sulfate  twice a day is a supplement that has been shown to help moderate to severe arthritis. Vitamin D 2000 IU daily Fish oil 2 grams daily.  Tumeric  twice daily.  Consider  of iron daily  Capsaicin topically up to four times a day may also help with pain. It's important that you continue to stay active. Consider physical therapy to strengthen muscles around the joint that hurts to take pressure off of the joint itself.  Water aerobics and cycling with low resistance are the best two types of exercise for arthritis. Add in 2 days of some mild weight lifting for bone health.  Come back and see me in 3-4 weeks.

## 2014-03-13 NOTE — Assessment & Plan Note (Signed)
This supplementation vitamin D.

## 2014-03-13 NOTE — Progress Notes (Signed)
[name] [address] [place], [place] [address] Phone: [phone] Subjective:    I'm seeing this patient by the request  of:  Rene Paci, MD   CC: Back pain  LKG:[id number] Beverly [name] is a 68 y.o. female coming in with complaint of back pain. Patient has a past medical history significant for osteopenia. Patient is also had imaging previously of her lumbar spine back in 2013. At that time patient had minimal degenerative changes with moderate scoliosis. These were reviewed by me today. Patient states that unfortunately her back pain over the course last 2 years has increased somewhat. Patient states it is more of a dull throbbing sensation that occurs mostly at night. This is not stopping her from any activities but states that it is uncomfortable. Patient denies any radiation into her legs any numbness. Patient states it is worse when she is laying down. Patient has tried switching pads without any significant improvement. Patient states that the pain is approximately 5 out of 10. Patient is just more concerned that if this seems to worsen over the course of time that they would be intolerable. Patient is very active doing a lot of horseback riding as well as yoga most days a week.       Past medical history, social, surgical and family history all reviewed in electronic medical record.   Review of Systems: No headache, visual changes, nausea, vomiting, diarrhea, constipation, dizziness, abdominal pain, skin rash, fevers, chills, night sweats, weight loss, swollen lymph nodes, body aches, joint swelling, muscle aches, chest pain, shortness of breath, mood changes.   Objective Blood pressure 108/70, pulse 75, height  (1.651 m), weight 129 lb (58.514 kg), SpO2 96.00%.  General: No apparent distress alert and oriented x3 mood and affect normal, dressed appropriately.  HEENT: Pupils equal, extraocular movements intact  Respiratory: Patient's  speak in full sentences and does not appear short of breath  Cardiovascular: No lower extremity edema, non tender, no erythema  Skin: Warm dry intact with no signs of infection or rash on extremities or on axial skeleton.  Abdomen: Soft nontender  Neuro: Cranial nerves II through XII are intact, neurovascularly intact in all extremities with 2+ DTRs and 2+ pulses.  Lymph: No lymphadenopathy of posterior or anterior cervical chain or axillae bilaterally.  Gait normal with good balance and coordination.  MSK:  Non tender with full range of motion and good stability and symmetric strength and tone of shoulders, elbows, wrist, hip, knee and ankles bilaterally.  Back Exam:  Inspection: Unremarkable  Motion: Flexion 45 deg, Extension 45 deg, Side Bending to 45 deg bilaterally,  Rotation to 45 deg bilaterally  SLR laying: Negative  XSLR laying: Negative  Palpable tenderness: Mild right-sided paraspinal musculature tenderness. FABER: negative. Sensory change: Gross sensation intact to all lumbar and sacral dermatomes.  Reflexes: 2+ at both patellar tendons, 2+ at achilles tendons, Babinski's downgoing.  Strength at foot  Plantar-flexion: 5/5 Dorsi-flexion: 5/5 Eversion: 5/5 Inversion: 5/5  Leg strength  Quad: 5/5 Hamstring: 5/5 Hip flexor: 5/5 Hip abductors: 5/5  Gait unremarkable.  OMT Physical Exam  Standing structural        Standing flexion right  Seated Flexion right  Cervical  Neutral Thoracic T3 extended rotated and side bent right T5 extended rotated and side bent left  Lumbar  L2 flexed rotated and side bent right  Sacrum Right on right         Impression and Recommendations:  This case required medical decision making of moderate complexity.

## 2014-03-13 NOTE — Assessment & Plan Note (Signed)
Patient does have chronic back pain overall. I think that is secondary more to muscle imbalances. We discussed postural changes as well as strengthening exercises. Patient was given a handout today. We discussed over-the-counter topical medications that could be helpful and was given a low dose of tramadol to help with the nighttime pain. Patient did respond well to osteopathic manipulation today and will come back in 3 weeks for further evaluation and treatment. There is no signs of radicular symptoms have any to concern. I do not think that further imaging is necessary at this time the patient continues to have pain I would repeat x-rays. We also discussed icing protocol.

## 2014-04-10 ENCOUNTER — Ambulatory Visit (INDEPENDENT_AMBULATORY_CARE_PROVIDER_SITE_OTHER): Payer: Medicare Other | Admitting: Family Medicine

## 2014-04-10 ENCOUNTER — Encounter: Payer: Self-pay | Admitting: Family Medicine

## 2014-04-10 VITALS — BP 122/70 | HR 69 | Temp 98.4°F | Wt 129.0 lb

## 2014-04-10 DIAGNOSIS — M545 Low back pain, unspecified: Secondary | ICD-10-CM

## 2014-04-10 DIAGNOSIS — M533 Sacrococcygeal disorders, not elsewhere classified: Secondary | ICD-10-CM | POA: Diagnosis not present

## 2014-04-10 DIAGNOSIS — M999 Biomechanical lesion, unspecified: Secondary | ICD-10-CM | POA: Diagnosis not present

## 2014-04-10 NOTE — Assessment & Plan Note (Signed)
I do believe that some the patient's pain is secondary to sacroiliac joint dysfunction. Patient was given an icing protocol, home exercises, as well as will try a topical anti-inflammatory. Patient continues to respond very well to osteopathic manipulation. We will continue this on a fairly regular basis and we'll decrease frequency 2 every 4 weeks. Patient will try the interventions we discussed and will follow up at that time in 4 weeks.

## 2014-04-10 NOTE — Patient Instructions (Signed)
Good to see you  5 new exercises this time.  Sacroiliac Joint Mobilization and Rehab 1. Work on pretzel stretching, shoulder back and leg draped in front. 3-5 sets, 30 sec.. 2. hip abductor rotations. standing, hip flexion and rotation outward then inward. 3 sets, 15 reps. when can do comfortably, add ankle weights starting at 2 pounds.  3. cross over stretching - shoulder back to ground, same side leg crossover. 3-5 sets for 30 min..  4. rolling up and back knees to chest and rocking. 5. sacral tilt - 5 sets, hold for 5-10 seconds Try the pennsaid at night. If you like we can do a prescription.  Try to double up the prilosec and see how you are doing.  See me again in 1 month.

## 2014-04-10 NOTE — Progress Notes (Signed)
Pre visit review using our clinic review tool, if applicable. No additional management support is needed unless otherwise documented below in the visit note. 

## 2014-04-10 NOTE — Progress Notes (Signed)
  [facility] [address] [place], [place] [address] Phone: [phone] Subjective:     CC: Back pain follow up  BJY:[id number]:Subjective Beverly [name] is a 68 y.o. female coming in with complaint of back pain. Patient has a past medical history significant for osteopenia. Patient is also had imaging previously of her lumbar spine back in 2013. At that time patient had minimal degenerative changes with moderate scoliosis. Patient was seen 3 weeks ago and did have osteopathic manipulation as well as given home exercises, icing protocol we discussed over-the-counter medications. Patient states that she is approximately 30-40% better. Patient states that sometimes she still has some mild discomfort and she has been doing a lot of activities that she normally does not. Patient has had many weddings and parties and has been traveling in the car for multiple hours on end. Patient states that this can give her some discomfort. Still having pain mostly at night. Denies though any radiation of pain or any numbness or tingling. Continues to be up to do all activities without any significant discomfort.       Past medical history, social, surgical and family history all reviewed in electronic medical record.   Review of Systems: No headache, visual changes, nausea, vomiting, diarrhea, constipation, dizziness, abdominal pain, skin rash, fevers, chills, night sweats, weight loss, swollen lymph nodes, body aches, joint swelling, muscle aches, chest pain, shortness of breath, mood changes.   Objective Blood pressure 122/70, pulse 69, temperature 98.4 F (36.9 C), temperature source Oral, weight 129 lb (58.514 kg), SpO2 97.00%.  General: No apparent distress alert and oriented x3 mood and affect normal, dressed appropriately.  HEENT: Pupils equal, extraocular movements intact  Respiratory: Patient's speak in full sentences and does not appear short of breath  Cardiovascular: No lower  extremity edema, non tender, no erythema  Skin: Warm dry intact with no signs of infection or rash on extremities or on axial skeleton.  Abdomen: Soft nontender  Neuro: Cranial nerves II through XII are intact, neurovascularly intact in all extremities with 2+ DTRs and 2+ pulses.  Lymph: No lymphadenopathy of posterior or anterior cervical chain or axillae bilaterally.  Gait normal with good balance and coordination.  MSK:  Non tender with full range of motion and good stability and symmetric strength and tone of shoulders, elbows, wrist, hip, knee and ankles bilaterally.  Back Exam:  Inspection: Unremarkable  Motion: Flexion 45 deg, Extension 45 deg, Side Bending to 45 deg bilaterally,  Rotation to 45 deg bilaterally  SLR laying: Negative  XSLR laying: Negative  Palpable tenderness: Really nontender on exam today. FABER: negative. Sensory change: Gross sensation intact to all lumbar and sacral dermatomes.  Reflexes: 2+ at both patellar tendons, 2+ at achilles tendons, Babinski's downgoing.  Strength at foot  Plantar-flexion: 5/5 Dorsi-flexion: 5/5 Eversion: 5/5 Inversion: 5/5  Leg strength  Quad: 5/5 Hamstring: 5/5 Hip flexor: 5/5 Hip abductors: 5/5  Gait unremarkable.  OMT Physical Exam  Standing structural        Standing flexion right  Seated Flexion right  Cervical  Neutral Thoracic T3 extended rotated and side bent right T5 extended rotated and side bent left  Lumbar  L2 flexed rotated and side bent right  Sacrum Right on right         Impression and Recommendations:     This case required medical decision making of moderate complexity.

## 2014-04-10 NOTE — Assessment & Plan Note (Signed)
Decision today to treat with OMT was based on Physical Exam  After verbal consent patient was treated with HVLA, muscle energy techniques in thoracic, lumbar and sacral areas  Patient tolerated the procedure well with improvement in symptoms  Patient given exercises, stretches and lifestyle modifications  See medications in patient instructions if given  Patient will follow up in 4 weeks

## 2014-04-12 ENCOUNTER — Telehealth: Payer: Self-pay | Admitting: Internal Medicine

## 2014-04-12 MED ORDER — OMEPRAZOLE 20 MG PO CPDR: 40.0000 mg | DELAYED_RELEASE_CAPSULE | Freq: Two times a day (BID) | ORAL | Status: DC

## 2014-04-12 NOTE — Telephone Encounter (Signed)
Pt wanted to know if Dr Katrinka BlazingSmith could call in omeprazole (PRILOSEC) 20 MG capsule [[id number]][[id number]] Since he had her double up on it

## 2014-04-12 NOTE — Telephone Encounter (Signed)
Refill done.  

## 2014-05-01 ENCOUNTER — Other Ambulatory Visit: Payer: Self-pay | Admitting: Obstetrics and Gynecology

## 2014-05-01 DIAGNOSIS — Z23 Encounter for immunization: Secondary | ICD-10-CM | POA: Diagnosis not present

## 2014-05-01 DIAGNOSIS — N938 Other specified abnormal uterine and vaginal bleeding: Secondary | ICD-10-CM | POA: Diagnosis not present

## 2014-05-01 DIAGNOSIS — N859 Noninflammatory disorder of uterus, unspecified: Secondary | ICD-10-CM | POA: Diagnosis not present

## 2014-05-10 ENCOUNTER — Encounter: Payer: Self-pay | Admitting: Family Medicine

## 2014-05-10 ENCOUNTER — Ambulatory Visit (INDEPENDENT_AMBULATORY_CARE_PROVIDER_SITE_OTHER): Payer: Medicare Other | Admitting: Family Medicine

## 2014-05-10 VITALS — BP 112/78 | HR 72 | Ht 65.0 in | Wt 128.0 lb

## 2014-05-10 DIAGNOSIS — M9902 Segmental and somatic dysfunction of thoracic region: Secondary | ICD-10-CM | POA: Diagnosis not present

## 2014-05-10 DIAGNOSIS — M999 Biomechanical lesion, unspecified: Secondary | ICD-10-CM

## 2014-05-10 DIAGNOSIS — Z8719 Personal history of other diseases of the digestive system: Secondary | ICD-10-CM | POA: Diagnosis not present

## 2014-05-10 DIAGNOSIS — M9903 Segmental and somatic dysfunction of lumbar region: Secondary | ICD-10-CM | POA: Diagnosis not present

## 2014-05-10 DIAGNOSIS — M545 Low back pain, unspecified: Secondary | ICD-10-CM

## 2014-05-10 DIAGNOSIS — M9904 Segmental and somatic dysfunction of sacral region: Secondary | ICD-10-CM

## 2014-05-10 DIAGNOSIS — K2100 Gastro-esophageal reflux disease with esophagitis, without bleeding: Secondary | ICD-10-CM

## 2014-05-10 MED ORDER — DICLOFENAC SODIUM 2 % TD SOLN: TRANSDERMAL | Status: DC

## 2014-05-10 NOTE — Assessment & Plan Note (Signed)
Referred to gastroenterology for continued nocturnal awakenings.

## 2014-05-10 NOTE — Assessment & Plan Note (Signed)
Decision today to treat with OMT was based on Physical Exam  After verbal consent patient was treated with HVLA, muscle energy techniques in thoracic, lumbar and sacral areas  Patient tolerated the procedure well with improvement in symptoms  Patient given exercises, stretches and lifestyle modifications  See medications in patient instructions if given  Patient will follow up in 3 weeks

## 2014-05-10 NOTE — Patient Instructions (Signed)
New exercises for the hip flexor Continue the other exercises and posture training if you can Pennsaid up to 2 times daily Would consider continuing the over the counter medicines we discussed Will have GI weight in and they should call you.  I think some of the pain may be intensified from the stomach.  See me again in 3 weeks.

## 2014-05-10 NOTE — Progress Notes (Signed)
  Beverly [name] D.O. [facility] [address] [place], [place] [address] Phone: [phone] Subjective:     CC: Back pain follow up  OZH:YQMVHQIONGHPI:Subjective Beverly [name] is a 68 y.o. female coming in with complaint of back pain. = Patient still states that she is having pain mostly at night. Patient states when she lays down she does get bad reflex and an unfortunate she starts having severe back pain. Patient states that it is always thoracic. Seems to get better when she starts moving. Not stopping her from her daily activities but is stopping her from sleeping. Patient continues to go to yoga which she says seems to be helpful. Patient has stop the over-the-counter medicines because she doesn't like taking medicines. States that the topical anti-inflammatories beneficial. Has not been icing and is doing the exercises intermittently.       Past medical history, social, surgical and family history all reviewed in electronic medical record.   Review of Systems: No headache, visual changes, nausea, vomiting, diarrhea, constipation, dizziness, abdominal pain, skin rash, fevers, chills, night sweats, weight loss, swollen lymph nodes, body aches, joint swelling, muscle aches, chest pain, shortness of breath, mood changes.   Objective Blood pressure 112/78, pulse 72, height 5\' 5"  (1.651 m), weight 128 lb (58.06 kg), SpO2 97.00%.  General: No apparent distress alert and oriented x3 mood and affect normal, dressed appropriately.  HEENT: Pupils equal, extraocular movements intact  Respiratory: Patient's speak in full sentences and does not appear short of breath  Cardiovascular: No lower extremity edema, non tender, no erythema  Skin: Warm dry intact with no signs of infection or rash on extremities or on axial skeleton.  Abdomen: Soft nontender  Neuro: Cranial nerves II through XII are intact, neurovascularly intact in all extremities with 2+ DTRs and 2+ pulses.  Lymph: No lymphadenopathy of  posterior or anterior cervical chain or axillae bilaterally.  Gait normal with good balance and coordination.  MSK:  Non tender with full range of motion and good stability and symmetric strength and tone of shoulders, elbows, wrist, hip, knee and ankles bilaterally.  Back Exam:  Inspection: Unremarkable  Motion: Flexion 45 deg, Extension 45 deg, Side Bending to 45 deg bilaterally,  Rotation to 45 deg bilaterally  SLR laying: Negative  XSLR laying: Negative  Palpable tenderness: Really nontender on exam today. FABER: negative. Sensory change: Gross sensation intact to all lumbar and sacral dermatomes.  Reflexes: 2+ at both patellar tendons, 2+ at achilles tendons, Babinski's downgoing.  Strength at foot  Plantar-flexion: 5/5 Dorsi-flexion: 5/5 Eversion: 5/5 Inversion: 5/5  Leg strength  Quad: 5/5 Hamstring: 5/5 Hip flexor: 5/5 Hip abductors: 4/5  Gait unremarkable.  OMT Physical Exam  Standing structural        Standing flexion right  Seated Flexion right  Cervical  Neutral Thoracic T3 extended rotated and side bent right T5 extended rotated and side bent left  Lumbar  L2 flexed rotated and side bent right  Sacrum Right on right         Impression and Recommendations:     This case required medical decision making of moderate complexity.

## 2014-05-10 NOTE — Assessment & Plan Note (Signed)
I do believe the patient is going to have chronic back pain. I do think though that patient with her chronic cough, esophageal reflux, tenderness and dull aching back pain could be all associated. Patient has had a history of an EGD greater than 12 years ago. I believe the patient may be having more of a gastrointestinal problem and I would like to refer patient to GI for further evaluation. Depending patient having reflex could be giving her some of the back pain. Patient was given new exercises today with a handout to try to help with hip flexor stretches. Patient was responding fairly well to the over-the-counter medications and stopped this prematurely. Encourage her to start this again as well. We discussed the icing protocol. We discussed proper lifting mechanics. Patient and will come back and see me again in 3 weeks.

## 2014-05-27 ENCOUNTER — Ambulatory Visit: Payer: Medicare Other | Admitting: Gastroenterology

## 2014-06-03 ENCOUNTER — Ambulatory Visit: Payer: Medicare Other | Admitting: Family Medicine

## 2014-06-05 ENCOUNTER — Ambulatory Visit: Payer: Medicare Other | Admitting: Gastroenterology

## 2014-06-10 ENCOUNTER — Ambulatory Visit (INDEPENDENT_AMBULATORY_CARE_PROVIDER_SITE_OTHER): Payer: Medicare Other | Admitting: Physician Assistant

## 2014-06-10 ENCOUNTER — Encounter: Payer: Self-pay | Admitting: Physician Assistant

## 2014-06-10 ENCOUNTER — Telehealth: Payer: Self-pay | Admitting: Internal Medicine

## 2014-06-10 VITALS — BP 120/82 | HR 68 | Ht 65.0 in | Wt 130.2 lb

## 2014-06-10 DIAGNOSIS — R05 Cough: Secondary | ICD-10-CM | POA: Diagnosis not present

## 2014-06-10 DIAGNOSIS — K219 Gastro-esophageal reflux disease without esophagitis: Secondary | ICD-10-CM | POA: Diagnosis not present

## 2014-06-10 DIAGNOSIS — R053 Chronic cough: Secondary | ICD-10-CM

## 2014-06-10 MED ORDER — TRAMADOL HCL 50 MG PO TABS: 50.0000 mg | ORAL_TABLET | Freq: Two times a day (BID) | ORAL | Status: DC | PRN

## 2014-06-10 MED ORDER — OMEPRAZOLE 40 MG PO CPDR: 40.0000 mg | DELAYED_RELEASE_CAPSULE | Freq: Two times a day (BID) | ORAL | Status: DC

## 2014-06-10 NOTE — Progress Notes (Signed)
Agree with Ms. Esterwood's assessment and plan. Keyion Knack E. Gurvir Schrom, MD, FACG   

## 2014-06-10 NOTE — Telephone Encounter (Signed)
Spoke to pt, rx called into pharmacy. Pt made aware that she does have an appt with him tomorrow.

## 2014-06-10 NOTE — Telephone Encounter (Signed)
Called pt back to find out what pharmacy, no answer/unable to leave a msg.

## 2014-06-10 NOTE — Telephone Encounter (Signed)
Pt called in and thought or under the assumption that Dr Katrinka Blazingsmith called her in Tramadol for her back pain.  She said she would like to see if he can send some in for her.

## 2014-06-10 NOTE — Progress Notes (Signed)
Patient ID: Beverly [name] Halder, female   DOB: 10/03/1945, 68 y.o.   MRN: [id number]   Subjective:    Patient ID: Beverly [name] Dantuono, female    DOB: 01/17/1946, 68 y.o.   MRN: [id number]  HPI Beverly [name] is a pleasant 68 year old white female known to Dr. Leone PayorGessner from colonoscopy done in August 2014 which was a normal exam. She is referred today by Dr. Ayesha MohairZack Smith for evaluation of chronic cough and GERD. Patient relates that she has had a persistent cough over the past 15 or 16 years and has been on PPI therapy during all of that time. She states that she had a pH [facility] done about 15 years ago [facility] and was told that she had a lot of acid reflux. She does not remember having a prior EGD. She has been on various PPIs, is currently on omeprazole 20 mg twice daily. She was asked to increase this to 40 mg twice daily recently for about a week to see if this had any affect on her symptoms and she says she noticed perhaps a little bit of improvement. She has no complaints of dysphagia or odynophagia. She does have allergy symptoms and postnasal drip and had been told in the past by Dr. Fannie Kneelint Young that her cough is a combination of all of these problems. She says she coughs every day intermittently no worse at night and generally does not wake up at night coughing. He does get some intermittent sour brash. Does not feel that her cough is worsened at mealtime. No complaints of abdominal pain or changes in bowel habits. She says her throat always feels irritated like there is a tickle there that makes her want to cough. Does not have a diagnosis of asthma. She states that changes in temperature quickly will often increase her cough and if she walks into her room with a fire going etc. this also aggravates cough.  Review of Systems Pertinent positive and negative review of systems were noted in the above HPI section.  All other review of systems was otherwise negative.  Outpatient Encounter Prescriptions as of  06/10/2014  Medication Sig  . estradiol-norethindrone (ACTIVELLA) 1-0.5 MG per tablet Take 1 tablet by mouth every other day.  . fluticasone (FLONASE) 50 MCG/ACT nasal spray USE 2 SPRAYS IN EACH NOSTRIL DAILY  . Glucosamine-Chondroitin 1500-1200 MG/30ML LIQD Take by mouth daily.    . Loperamide-Simethicone (IMODIUM ADVANCED) 2-125 MG TABS Take 1 tablet by mouth daily as needed.    . medroxyPROGESTERone (PROVERA) 2.5 MG tablet   . Multiple Vitamin (MULTIVITAMIN) tablet Take 1 tablet by mouth daily.    . naproxen sodium (ANAPROX) 220 MG tablet Take 220 mg by mouth as needed.  . NON FORMULARY 180 mg. Allergy Relief take 1 tablet daily  . Probiotic Product (SOLUBLE FIBER/PROBIOTICS PO) Take by mouth.  . [DISCONTINUED] omeprazole (PRILOSEC) 20 MG capsule Take 2 capsules (40 mg total) by mouth 2 (two) times daily before a meal. For one week and then go back to taking 1 capsule twice daily.  Marland Kitchen. omeprazole (PRILOSEC) 40 MG capsule Take 1 capsule (40 mg total) by mouth 2 (two) times daily.  . [DISCONTINUED] Diclofenac Sodium 2 % SOLN Apply twice daily.  . [DISCONTINUED] Phenyleph-Promethazine-Cod (PROMETHAZINE VC/CODEINE) 5-6.25-10 MG/5ML SYRP Take 5 mLs by mouth every 4 (four) hours as needed.  . [DISCONTINUED] traMADol (ULTRAM) 50 MG tablet Take 1 tablet (50 mg total) by mouth at bedtime as needed.  . [DISCONTINUED] valACYclovir (VALTREX) 1000  MG tablet Take 1 tablet (1,000 mg total) by mouth 3 (three) times daily.   No Known Allergies Patient Active Problem List   Diagnosis Date Noted  . 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 Nonallopathic lesion of lumbosacral region 03/13/2014  . Nonallopathic lesion of sacral region 03/13/2014  . Nonallopathic lesion of thoracic region 03/13/2014  . Osteopenia 03/14/2013  . Postmenopausal disorder 02/15/2013  . Lumbago 05/23/2012  . Irritable bowel syndrome 04/08/2008  . ALLERGIC RHINITIS 06/29/2007  . Esophageal reflux 06/29/2007  . Cough  06/29/2007   History   Social History  . Marital Status: Married    Spouse Name: [name]/A    Number of Children: [name]/A  . Years of Education: [name]/A   Occupational History  . Not on file.   Social History Main Topics  . Smoking status: Never Smoker   . Smokeless tobacco: Never Used  . Alcohol Use: 4.2 oz/week    7 Glasses of wine per week  . Drug Use: No  . Sexual Activity: Not on file   Other Topics Concern  . Not on file   Social History Narrative    Ms. Texidor's family history includes Colon cancer (age of onset: 3285) in her maternal grandfather; Heart disease in her father; Hyperlipidemia in her brother; Hypertension in her brother; Pancreatic cancer in her mother.      Objective:    Filed Vitals:   06/10/14 0850  BP: 120/82  Pulse: 68    Physical Exam  well-developed white female in no acute distress, pleasant intermittently coughing height 5 foot 5 weight 1:30. HEENT; nontraumatic normocephalic EOMI PERRLA sclera anicteric, Supple; no JVD, Cardiovascular; regular rate and rhythm with S1-S2 no murmur or gallop, Pulmonary; clear bilaterally, Abdomen; soft nontender nondistended bowel sounds are active there is no palpable mass or hepatosplenomegaly bowel sounds are present, Rectal ;exam not done, Extremities; no clubbing cyanosis or edema skin warm and dry, Psych; mood and affect appropriate       Assessment & Plan:   #91  68 year old female with long history of chronic GERD and chronic cough. I suspect her cough is a combination of reflux induced and post nasal drip/allergy related. Rule out Barrett's #2 Colon Neoplasia surveillance-normal colonoscopy August 2014  Plan; increase Prilosec to 40 mg by mouth twice daily Reviewed an  antireflux regimen and literature was provided Schedule for EGD with Dr. Leone PayorGessner. We will attempt to get results of her previous pH study.  Amy Oswald HillockS Esterwood PA-C 06/10/2014

## 2014-06-10 NOTE — Patient Instructions (Signed)
You have been scheduled for an endoscopy. Please follow written instructions given to you at your visit today. If you use inhalers (even only as needed), please bring them with you on the day of your procedure. Your physician has requested that you go to www.startemmi.com and enter the access code given to you at your visit today. This web site gives a general overview about your procedure. However, you should still follow specific instructions given to you by our office regarding your preparation for the procedure.  We have sent the following medications to your pharmacy for you to pick up at your convenience: Prilosec  Today you have been given GERD information to read and follow.  Please get back on your Calcium with Vitamin D.  Aim for at least 1200mg  of calcium and 400IU of Vitamin D.  Take this daily.   We will try to obtain [facility] for review. ROI signed.  I appreciate the opportunity to care for you.

## 2014-06-11 ENCOUNTER — Ambulatory Visit (INDEPENDENT_AMBULATORY_CARE_PROVIDER_SITE_OTHER): Payer: Medicare Other | Admitting: Family Medicine

## 2014-06-11 ENCOUNTER — Encounter: Payer: Self-pay | Admitting: Family Medicine

## 2014-06-11 VITALS — BP 112/74 | HR 72 | Ht 65.0 in | Wt 131.0 lb

## 2014-06-11 DIAGNOSIS — M533 Sacrococcygeal disorders, not elsewhere classified: Secondary | ICD-10-CM | POA: Diagnosis not present

## 2014-06-11 DIAGNOSIS — M9902 Segmental and somatic dysfunction of thoracic region: Secondary | ICD-10-CM | POA: Diagnosis not present

## 2014-06-11 DIAGNOSIS — M999 Biomechanical lesion, unspecified: Secondary | ICD-10-CM

## 2014-06-11 DIAGNOSIS — M9903 Segmental and somatic dysfunction of lumbar region: Secondary | ICD-10-CM

## 2014-06-11 DIAGNOSIS — M545 Low back pain, unspecified: Secondary | ICD-10-CM

## 2014-06-11 DIAGNOSIS — M9904 Segmental and somatic dysfunction of sacral region: Secondary | ICD-10-CM

## 2014-06-11 NOTE — Assessment & Plan Note (Signed)
Patient is doing significantly better. Patient did not need as much manipulation today which is wonderful. Patient's we discussed the possibility of repeat x-rays with her not having any for greater than 2 years but at this point with patient improving I do not the gamma change medical management. Encourage her to continue the topical anti-inflammatories and avoid oral anti-inflammatories. Also discussed with patient to avoid any extensive twisting motions at this time. Patient will come back and see me again in 6 weeks for further evaluation and treatment.

## 2014-06-11 NOTE — Patient Instructions (Signed)
Good to see you Beverly [name] is your friend.  Avoid anti-inflammtories for now. Only the pennsaid topically.  Tramadol at night Lets space you out to 6 weeks.

## 2014-06-11 NOTE — Progress Notes (Signed)
  Beverly [name] D.O. [facility] [address] [place], [place] [address] Phone: [phone] Subjective:     CC: Back pain follow up  BJY:[id number]:Subjective Beverly [name] is a 68 y.o. female coming in with complaint of back pain. Patient was seen previously and there was concern for gastroesophageal reflux disease that could be contributing to her back discomfort. Patient was seen by gastroenterology and is scheduled for an EGD later this month. Patient states overall she is actually improving. Patient states that with the exercises she is not having as much low back pain. Patient is having some upper back pain. Still worse at night. Some can be associated with her diet. Patient states that when she increased her Prilosec to twice daily she did notice some mild improvement. Overall though she would aches stated that her lower back is approximately 50% better.      Past medical history, social, surgical and family history all reviewed in electronic medical record.   Review of Systems: No headache, visual changes, nausea, vomiting, diarrhea, constipation, dizziness, abdominal pain, skin rash, fevers, chills, night sweats, weight loss, swollen lymph nodes, body aches, joint swelling, muscle aches, chest pain, shortness of breath, mood changes.   Objective Blood pressure 112/74, pulse 72, height 5\' 5"  (1.651 m), weight 131 lb (59.421 kg), SpO2 96 %.  General: No apparent distress alert and oriented x3 mood and affect normal, dressed appropriately.  HEENT: Pupils equal, extraocular movements intact  Respiratory: Patient's speak in full sentences and does not appear short of breath  Cardiovascular: No lower extremity edema, non tender, no erythema  Skin: Warm dry intact with no signs of infection or rash on extremities or on axial skeleton.  Abdomen: Soft nontender  Neuro: Cranial nerves II through XII are intact, neurovascularly intact in all extremities with 2+ DTRs and 2+ pulses.  Lymph:  No lymphadenopathy of posterior or anterior cervical chain or axillae bilaterally.  Gait normal with good balance and coordination.  MSK:  Non tender with full range of motion and good stability and symmetric strength and tone of shoulders, elbows, wrist, hip, knee and ankles bilaterally.  Back Exam:  Inspection: Unremarkable  Motion: Flexion 45 deg, Extension 45 deg, Side Bending to 45 deg bilaterally,  Rotation to 45 deg bilaterally  SLR laying: Negative  XSLR laying: Negative  Palpable tenderness: Really nontender on exam today. FABER: negative. Sensory change: Gross sensation intact to all lumbar and sacral dermatomes.  Reflexes: 2+ at both patellar tendons, 2+ at achilles tendons, Babinski's downgoing.  Strength at foot  Plantar-flexion: 5/5 Dorsi-flexion: 5/5 Eversion: 5/5 Inversion: 5/5  Leg strength  Quad: 5/5 Hamstring: 5/5 Hip flexor: 5/5 Hip abductors: 4/5  Gait unremarkable.  OMT Physical Exam  Standing structural        Standing flexion right  Seated Flexion right  Cervical  Neutral Thoracic T3 extended rotated and side bent right T5 extended rotated and side bent left  Lumbar  L2 flexed rotated and side bent right  Sacrum Right on right     Impression and Recommendations:     This case required medical decision making of moderate complexity.

## 2014-06-11 NOTE — Assessment & Plan Note (Signed)
I do believe that some of the upper back pain is secondary to her gastroesophageal reflux disease especially at night. [place] to see what the EGD shows.

## 2014-06-11 NOTE — Assessment & Plan Note (Addendum)
Decision today to treat with OMT was based on Physical Exam  After verbal consent patient was treated with HVLA, muscle energy techniques in thoracic, lumbar and sacral areas  Patient tolerated the procedure well with improvement in symptoms  Patient given exercises, stretches and lifestyle modifications  See medications in patient instructions if given  Patient will follow up in 6 weeks

## 2014-06-12 ENCOUNTER — Telehealth: Payer: Self-pay | Admitting: Physician Assistant

## 2014-06-12 NOTE — Telephone Encounter (Signed)
Call[facility] to offer [facility]. She reports it was a study done in Sept 2001 by Dr Charmayne Sheerhatterji of [facility] GI dept [facility]

## 2014-06-17 ENCOUNTER — Encounter: Payer: Self-pay | Admitting: Internal Medicine

## 2014-06-17 ENCOUNTER — Ambulatory Visit (AMBULATORY_SURGERY_CENTER): Payer: Medicare Other | Admitting: Internal Medicine

## 2014-06-17 ENCOUNTER — Telehealth: Payer: Self-pay

## 2014-06-17 VITALS — BP 140/84 | HR 63 | Temp 98.2°F | Resp 16 | Ht 65.0 in | Wt 130.0 lb

## 2014-06-17 DIAGNOSIS — R053 Chronic cough: Secondary | ICD-10-CM

## 2014-06-17 DIAGNOSIS — D649 Anemia, unspecified: Secondary | ICD-10-CM | POA: Diagnosis not present

## 2014-06-17 DIAGNOSIS — K219 Gastro-esophageal reflux disease without esophagitis: Secondary | ICD-10-CM

## 2014-06-17 DIAGNOSIS — K589 Irritable bowel syndrome without diarrhea: Secondary | ICD-10-CM | POA: Diagnosis not present

## 2014-06-17 DIAGNOSIS — R05 Cough: Secondary | ICD-10-CM | POA: Diagnosis not present

## 2014-06-17 MED ORDER — SODIUM CHLORIDE 0.9 % IV SOLN: 500.0000 mL | INTRAVENOUS | Status: DC

## 2014-06-17 NOTE — Telephone Encounter (Signed)
-----   Message from Iva Booparl E Gessner, MD sent at 06/17/2014 12:18 PM EST ----- Regarding: refer to Dr. Lucie LeatherKozlow Needs referral to Dr. Lucie LeatherKozlow re: chronic cough - please evaluate and treat  Please give me appointment info and I will create a letter

## 2014-06-17 NOTE — Progress Notes (Signed)
A/ox3, pleased with MAC, report to RN 

## 2014-06-17 NOTE — Op Note (Signed)
[facility] [name] [place], [address]   ENDOSCOPY PROCEDURE REPORT  PATIENT: Beverly [name], Beverly [name]  MR#: [id number] BIRTHDATE: 11/08/1945 , 68  yrs. old GENDER: female ENDOSCOPIST: Iva Booparl E Shandale Malak, MD, [facility] PROCEDURE DATE:  06/17/2014 PROCEDURE:  EGD, diagnostic ASA CLASS:     Class II INDICATIONS:  GERD and chronic cough. MEDICATIONS: Propofol 100 mg IV and Monitored anesthesia care TOPICAL ANESTHETIC: none  DESCRIPTION OF PROCEDURE: After the risks benefits and alternatives of the procedure were thoroughly explained, informed consent was obtained.  The LB [id number] endoscope was introduced through the mouth and advanced to the second portion of the duodenum , Without limitations.  The instrument was slowly withdrawn as the mucosa was fully examined.      EXAM: The esophagus and gastroesophageal junction were completely normal in appearance.  The stomach was entered and closely examined.The antrum, angularis, and lesser curvature were well visualized, including a retroflexed view of the cardia and fundus. The stomach wall was normally distensable.  The scope passed easily through the pylorus into the duodenum.  Retroflexed views revealed no abnormalities.     The scope was then withdrawn from the patient and the procedure completed.  COMPLICATIONS: There were no immediate complications.  ENDOSCOPIC IMPRESSION: Normal appearing esophagus and GE junction, the stomach was well visualized and normal in appearance, normal appearing duodenum   - cause of cough might be GERD but is likely multifactorial. [facility] ago. Believe that was off PPI.  RECOMMENDATIONS: Stay on omeprazole 40 mg bid for 2-3 months.  If persistent cough after that then consider 24 hour pH/impedance study (and esophageal manometry)   eSigned:  Iva Booparl E Meshelle Holness, MD, [facility] 06/17/2014 11:18 AM    CC: The Patient

## 2014-06-17 NOTE — Telephone Encounter (Signed)
Patient is scheduled for Dr. Lucie LeatherKozlow for 07/09/14 1:30  No answer/machine,  I will continue to try and reach the patient

## 2014-06-17 NOTE — Patient Instructions (Addendum)
The endoscopy exam looked normal. I saw your old pH study which demonstrated acid reflux. While this indicates you have reflux it cannot prove your cough is from reflux. I would have to say the pattern of cough you have makes me think it is unlikely to be reflux causing it or at least not the only cause.  Please stay on the Prilosec (omeprazole) 40 mg twice a day for 2-3 months to see what happens - if that resolves the cough then GERD is more likely as a cause. If it does not we could have you perform a test called 24 hour pH and impedance testing to see if there is non-acid reflux causing problems.  I appreciate the opportunity to care for you. Iva Booparl E. Travonta Gill, MD, FACG     YOU HAD AN ENDOSCOPIC PROCEDURE TODAY AT THE [facility]: Refer to the procedure report that was given to you for any specific questions about what was found during the examination.  If the procedure report does not answer your questions, please call your gastroenterologist to clarify.  If you requested that your care partner not be given the details of your procedure findings, then the procedure report has been included in a sealed envelope for you to review at your convenience later.  YOU SHOULD EXPECT: Some feelings of bloating in the abdomen. Passage of more gas than usual.  Walking can help get rid of the air that was put into your GI tract during the procedure and reduce the bloating. If you had a lower endoscopy (such as a colonoscopy or flexible sigmoidoscopy) you may notice spotting of blood in your stool or on the toilet paper. If you underwent a bowel prep for your procedure, then you may not have a normal bowel movement for a few days.  DIET: Your first meal following the procedure should be a light meal and then it is ok to progress to your normal diet.  A half-sandwich or bowl of soup is an example of a good first meal.  Heavy or fried foods are harder to digest and may make you feel nauseous or  bloated.  Likewise meals heavy in dairy and vegetables can cause extra gas to form and this can also increase the bloating.  Drink plenty of fluids but you should avoid alcoholic beverages for 24 hours.  ACTIVITY: Your care partner should take you home directly after the procedure.  You should plan to take it easy, moving slowly for the rest of the day.  You can resume normal activity the day after the procedure however you should NOT DRIVE or use heavy machinery for 24 hours (because of the sedation medicines used during the test).    SYMPTOMS TO REPORT IMMEDIATELY: A gastroenterologist can be reached at any hour.  During normal business hours, 8:30 AM to 5:00 PM Monday through Friday, call [phone].  After hours and on weekends, please call the GI answering service at [phone] who will take a message and have the physician on call contact you.    Following upper endoscopy (EGD)  Vomiting of blood or coffee ground material  New chest pain or pain under the shoulder blades  Painful or persistently difficult swallowing  New shortness of breath  Fever of 100F or higher  Black, tarry-looking stools  FOLLOW UP: If any biopsies were taken you will be contacted by phone or by letter within the next 1-3 weeks.  Call your gastroenterologist if you have not heard about  the biopsies in 3 weeks.  Our staff will call the home number listed on your records the next business day following your procedure to check on you and address any questions or concerns that you may have at that time regarding the information given to you following your procedure. This is a courtesy call and so if there is no answer at the home number and we have not heard from you through the emergency physician on call, we will assume that you have returned to your regular daily activities without incident.  SIGNATURES/CONFIDENTIALITY: You and/or your care partner have signed paperwork which will be entered into your  electronic medical record.  These signatures attest to the fact that that the information above on your After Visit Summary has been reviewed and is understood.  Full responsibility of the confidentiality of this discharge information lies with you and/or your care-partner.    Resume medications.

## 2014-06-18 ENCOUNTER — Telehealth: Payer: Self-pay | Admitting: *Deleted

## 2014-06-18 NOTE — Telephone Encounter (Signed)
  Follow up Call-  Call back number 06/17/2014 03/01/2013  Post procedure Call Back phone  # [phone] [phone]  Permission to leave phone message Yes Yes     Patient questions:  Do you have a fever, pain , or abdominal swelling? No. Pain Score  0 *  Have you tolerated food without any problems? Yes.    Have you been able to return to your normal activities? Yes.    Do you have any questions about your discharge instructions: Diet   No. Medications  No. Follow up visit  No.  Do you have questions or concerns about your Care? No.  Actions: * If pain score is 4 or above: No action needed, pain <4.

## 2014-06-19 NOTE — Telephone Encounter (Signed)
Patient notified of appt date and time

## 2014-06-26 ENCOUNTER — Encounter: Payer: Self-pay | Admitting: Internal Medicine

## 2014-07-09 DIAGNOSIS — J4540 Moderate persistent asthma, uncomplicated: Secondary | ICD-10-CM | POA: Diagnosis not present

## 2014-07-09 DIAGNOSIS — J309 Allergic rhinitis, unspecified: Secondary | ICD-10-CM | POA: Diagnosis not present

## 2014-07-22 ENCOUNTER — Encounter: Payer: Self-pay | Admitting: Family Medicine

## 2014-07-22 ENCOUNTER — Ambulatory Visit (INDEPENDENT_AMBULATORY_CARE_PROVIDER_SITE_OTHER): Payer: Medicare Other | Admitting: Family Medicine

## 2014-07-22 VITALS — BP 132/82 | HR 75 | Ht 65.0 in | Wt 126.0 lb

## 2014-07-22 DIAGNOSIS — M999 Biomechanical lesion, unspecified: Secondary | ICD-10-CM

## 2014-07-22 DIAGNOSIS — M9902 Segmental and somatic dysfunction of thoracic region: Secondary | ICD-10-CM

## 2014-07-22 DIAGNOSIS — M9903 Segmental and somatic dysfunction of lumbar region: Secondary | ICD-10-CM

## 2014-07-22 DIAGNOSIS — M9904 Segmental and somatic dysfunction of sacral region: Secondary | ICD-10-CM

## 2014-07-22 DIAGNOSIS — M533 Sacrococcygeal disorders, not elsewhere classified: Secondary | ICD-10-CM | POA: Diagnosis not present

## 2014-07-22 NOTE — Assessment & Plan Note (Signed)
Decision today to treat with OMT was based on Physical Exam  After verbal consent patient was treated with HVLA, muscle energy techniques in thoracic, lumbar and sacral areas  Patient tolerated the procedure well with improvement in symptoms  Patient given exercises, stretches and lifestyle modifications  See medications in patient instructions if given  Patient will follow up in 6 weeks        

## 2014-07-22 NOTE — Assessment & Plan Note (Signed)
Patient continues to respond very well to conservative therapy. Discussed with patient to continue to work on core strengthening that will beneficial. Patient does have tramadol if she needs it for any breakthrough pain. Patient does have some degenerative changes as well as some degenerative scoliosis that is likely contributing to some of the discomfort. Encourage her to continue the over-the-counter medications. Patient is now being treated for gastroesophageal reflux as well as was given a inhaler recently. We will see if this makes any benefit. Patient improving so much we will actually decrease the frequency of office visits and patient will come back in 6 weeks for further evaluation.

## 2014-07-22 NOTE — Patient Instructions (Addendum)
Good to see you Ice is your friend after activity  Continue to do what you are doing.  Continue the vitamins Lets say 6 weeks.

## 2014-07-22 NOTE — Progress Notes (Signed)
  Tawana ScaleZach Lovelle Deitrick D.O. [facility] [address] [place], [place] [address] Phone: [phone] Subjective:     CC: Back pain follow up  BJY:NWGNFAOZHYHPI:Subjective Beverly [name] is a 69 y.o. female coming in with complaint of back pain. Patient was seen previously and there was concern for gastroesophageal reflux disease that could be contributing to her back discomfort.   Patient has also been diagnosed with a sacroiliac joint dysfunction. Patient has been receiving osteopathic manipulation as well as doing home exercises including abductor strengthening. Patient states overall she has been doing relatively well. Some mild aches and pains but nothing that is keeping her from doing activity. Patient states that she actually continues to improve. No severe pain that she was having previously. Overall patient is very happy with the results.     Past medical history, social, surgical and family history all reviewed in electronic medical record.   Review of Systems: No headache, visual changes, nausea, vomiting, diarrhea, constipation, dizziness, abdominal pain, skin rash, fevers, chills, night sweats, weight loss, swollen lymph nodes, body aches, joint swelling, muscle aches, chest pain, shortness of breath, mood changes.   Objective Blood pressure 132/82, pulse 75, height 5\' 5"  (1.651 m), weight 126 lb (57.153 kg), SpO2 97 %.  General: No apparent distress alert and oriented x3 mood and affect normal, dressed appropriately.  HEENT: Pupils equal, extraocular movements intact  Respiratory: Patient's speak in full sentences and does not appear short of breath  Cardiovascular: No lower extremity edema, non tender, no erythema  Skin: Warm dry intact with no signs of infection or rash on extremities or on axial skeleton.  Abdomen: Soft nontender  Neuro: Cranial nerves II through XII are intact, neurovascularly intact in all extremities with 2+ DTRs and 2+ pulses.  Lymph: No lymphadenopathy of  posterior or anterior cervical chain or axillae bilaterally.  Gait normal with good balance and coordination.  MSK:  Non tender with full range of motion and good stability and symmetric strength and tone of shoulders, elbows, wrist, hip, knee and ankles bilaterally.  Back Exam:  Inspection: Unremarkable  Motion: Flexion 45 deg, Extension 45 deg, Side Bending to 45 deg bilaterally,  Rotation to 45 deg bilaterally  SLR laying: Negative  XSLR laying: Negative  Palpable tenderness: Really nontender on exam today. FABER: negative. Sensory change: Gross sensation intact to all lumbar and sacral dermatomes.  Reflexes: 2+ at both patellar tendons, 2+ at achilles tendons, Babinski's downgoing.  Strength at foot  Plantar-flexion: 5/5 Dorsi-flexion: 5/5 Eversion: 5/5 Inversion: 5/5  Leg strength  Quad: 5/5 Hamstring: 5/5 Hip flexor: 5/5 Hip abductors: 4/5  Gait unremarkable.  OMT Physical Exam   Cervical  C4 flexed rotated and side bent right  Thoracic T3 extended rotated and side bent right T5 extended rotated and side bent left  Lumbar  L2 flexed rotated and side bent right L4 flexed rotated and side bent left Sacrum Right on right    Impression and Recommendations:     This case required medical decision making of moderate complexity.

## 2014-08-05 ENCOUNTER — Telehealth: Payer: Self-pay | Admitting: Internal Medicine

## 2014-08-05 MED ORDER — FLUTICASONE PROPIONATE 50 MCG/ACT NA SUSP: 2.0000 | Freq: Every day | NASAL | Status: DC

## 2014-08-05 NOTE — Telephone Encounter (Signed)
Pt called in and would like fluticasone (FLONASE) 50 MCG/ACT nasal spray [[id number]][[id number]]  Sent to:   [facility] [phone]    * pt changed ins compy and none of her medication can be sent to [facility] any longer.  [facility] [address]

## 2014-08-05 NOTE — Telephone Encounter (Signed)
Notified pt [facility] to [facility]..lmb 

## 2014-08-09 DIAGNOSIS — J370 Chronic laryngitis: Secondary | ICD-10-CM | POA: Diagnosis not present

## 2014-08-09 DIAGNOSIS — J309 Allergic rhinitis, unspecified: Secondary | ICD-10-CM | POA: Diagnosis not present

## 2014-09-02 ENCOUNTER — Ambulatory Visit (INDEPENDENT_AMBULATORY_CARE_PROVIDER_SITE_OTHER): Payer: Medicare Other | Admitting: Family Medicine

## 2014-09-02 ENCOUNTER — Encounter: Payer: Self-pay | Admitting: Family Medicine

## 2014-09-02 VITALS — BP 118/82 | HR 107 | Ht 65.0 in | Wt 125.0 lb

## 2014-09-02 DIAGNOSIS — M533 Sacrococcygeal disorders, not elsewhere classified: Secondary | ICD-10-CM | POA: Diagnosis not present

## 2014-09-02 DIAGNOSIS — M9902 Segmental and somatic dysfunction of thoracic region: Secondary | ICD-10-CM | POA: Diagnosis not present

## 2014-09-02 DIAGNOSIS — M9904 Segmental and somatic dysfunction of sacral region: Secondary | ICD-10-CM

## 2014-09-02 DIAGNOSIS — M999 Biomechanical lesion, unspecified: Secondary | ICD-10-CM

## 2014-09-02 DIAGNOSIS — M9903 Segmental and somatic dysfunction of lumbar region: Secondary | ICD-10-CM

## 2014-09-02 NOTE — Progress Notes (Signed)
Pre visit review using our clinic review tool, if applicable. No additional management support is needed unless otherwise documented below in the visit note. 

## 2014-09-02 NOTE — Progress Notes (Signed)
  Tawana ScaleZach Ronrico Dupin D.O. [facility] [address] [place], [place] [address] Phone: [phone] Subjective:     CC: Back pain follow up  BJY:NWGNFAOZHYHPI:Subjective Ricci Barkerancy N Losee is a 69 y.o. female coming in with complaint of back pain.   Patient has also been diagnosed with a sacroiliac joint dysfunction. Patient has been receiving osteopathic manipulation patient states that she was doing significantly better and then unfortunately started having increasing pain after sitting a long time in a car. Patient did travel to [place] and back. Patient did do multiple stops on the way but still had pain.     Past medical history, social, surgical and family history all reviewed in electronic medical record.   Review of Systems: No headache, visual changes, nausea, vomiting, diarrhea, constipation, dizziness, abdominal pain, skin rash, fevers, chills, night sweats, weight loss, swollen lymph nodes, body aches, joint swelling, muscle aches, chest pain, shortness of breath, mood changes.   Objective Blood pressure 118/82, pulse 107, height 5\' 5"  (1.651 m), weight 125 lb (56.7 kg), SpO2 92 %.  General: No apparent distress alert and oriented x3 mood and affect normal, dressed appropriately.  HEENT: Pupils equal, extraocular movements intact  Respiratory: Patient's speak in full sentences and does not appear short of breath  Cardiovascular: No lower extremity edema, non tender, no erythema  Skin: Warm dry intact with no signs of infection or rash on extremities or on axial skeleton.  Abdomen: Soft nontender  Neuro: Cranial nerves II through XII are intact, neurovascularly intact in all extremities with 2+ DTRs and 2+ pulses.  Lymph: No lymphadenopathy of posterior or anterior cervical chain or axillae bilaterally.  Gait normal with good balance and coordination.  MSK:  Non tender with full range of motion and good stability and symmetric strength and tone of shoulders, elbows, wrist, hip, knee and  ankles bilaterally.  Back Exam:  Inspection: Unremarkable  Motion: Flexion 45 deg, Extension 45 deg, Side Bending to 45 deg bilaterally,  Rotation to 45 deg bilaterally  SLR laying: Negative  XSLR laying: Negative  Palpable tenderness: Really nontender on exam today. FABER: negative. Sensory change: Gross sensation intact to all lumbar and sacral dermatomes.  Reflexes: 2+ at both patellar tendons, 2+ at achilles tendons, Babinski's downgoing.  Strength at foot  Plantar-flexion: 5/5 Dorsi-flexion: 5/5 Eversion: 5/5 Inversion: 5/5  Leg strength  Quad: 5/5 Hamstring: 5/5 Hip flexor: 5/5 Hip abductors: 4/5  Gait unremarkable. No change from previous exam.   OMT Physical Exam   Cervical  C4 flexed rotated and side bent right  Thoracic T3 extended rotated and side bent right T5 extended rotated and side bent left  Lumbar  L2 flexed rotated and side bent right L4 flexed rotated and side bent left Sacrum Right on right    Impression and Recommendations:     This case required medical decision making of moderate complexity.

## 2014-09-02 NOTE — Patient Instructions (Signed)
You are doing great! Ice is your friend Try to roll up a towel or jacket beneath legs with driving or sitting greater then 2 hours.  Tramadol when you need it.  See me again after the trip to [place]

## 2014-09-02 NOTE — Assessment & Plan Note (Signed)
Patient is doing remarkably well at this time. We discussed again about possible x-rays and patient declined. Patient is doing very well with conservative therapy and encourage her to continue the exercises about 3 times a week. Patient does have tramadol for any breakthrough pain. We discussed icing regimen. We discussed how to change ergonomics when sitting for long amount of time. Patient will come back and see me again in 6 weeks for further evaluation and treatment.

## 2014-09-02 NOTE — Assessment & Plan Note (Signed)
Decision today to treat with OMT was based on Physical Exam  After verbal consent patient was treated with HVLA, muscle energy techniques in thoracic, lumbar and sacral areas  Patient tolerated the procedure well with improvement in symptoms  Patient given exercises, stretches and lifestyle modifications  See medications in patient instructions if given  Patient will follow up in 6 weeks        

## 2014-09-06 ENCOUNTER — Other Ambulatory Visit: Payer: Self-pay | Admitting: Obstetrics and Gynecology

## 2014-09-06 DIAGNOSIS — Z124 Encounter for screening for malignant neoplasm of cervix: Secondary | ICD-10-CM | POA: Diagnosis not present

## 2014-09-06 DIAGNOSIS — Z1231 Encounter for screening mammogram for malignant neoplasm of breast: Secondary | ICD-10-CM | POA: Diagnosis not present

## 2014-09-09 LAB — CYTOLOGY - PAP

## 2014-09-12 DIAGNOSIS — Z961 Presence of intraocular lens: Secondary | ICD-10-CM | POA: Diagnosis not present

## 2014-09-12 DIAGNOSIS — H43393 Other vitreous opacities, bilateral: Secondary | ICD-10-CM | POA: Diagnosis not present

## 2014-09-12 DIAGNOSIS — H43813 Vitreous degeneration, bilateral: Secondary | ICD-10-CM | POA: Diagnosis not present

## 2014-09-12 DIAGNOSIS — H31093 Other chorioretinal scars, bilateral: Secondary | ICD-10-CM | POA: Diagnosis not present

## 2014-09-17 ENCOUNTER — Telehealth: Payer: Self-pay | Admitting: Family Medicine

## 2014-09-17 MED ORDER — TRAMADOL HCL 50 MG PO TABS: 50.0000 mg | ORAL_TABLET | Freq: Two times a day (BID) | ORAL | Status: DC | PRN

## 2014-09-17 NOTE — Telephone Encounter (Signed)
Patient requesting refill of traMADol (ULTRAM) 50 MG tablet [[id number]][[id number]] to [facility] [address]. Ph # [phone]

## 2014-09-17 NOTE — Telephone Encounter (Signed)
Refill faxed into pharmacy.

## 2014-10-08 DIAGNOSIS — J4540 Moderate persistent asthma, uncomplicated: Secondary | ICD-10-CM | POA: Diagnosis not present

## 2014-10-08 DIAGNOSIS — J309 Allergic rhinitis, unspecified: Secondary | ICD-10-CM | POA: Diagnosis not present

## 2014-12-03 DIAGNOSIS — J370 Chronic laryngitis: Secondary | ICD-10-CM | POA: Diagnosis not present

## 2014-12-03 DIAGNOSIS — J4540 Moderate persistent asthma, uncomplicated: Secondary | ICD-10-CM | POA: Diagnosis not present

## 2014-12-03 DIAGNOSIS — J309 Allergic rhinitis, unspecified: Secondary | ICD-10-CM | POA: Diagnosis not present

## 2014-12-10 ENCOUNTER — Telehealth: Payer: Self-pay | Admitting: Geriatric Medicine

## 2014-12-10 ENCOUNTER — Encounter: Payer: Self-pay | Admitting: Geriatric Medicine

## 2014-12-10 NOTE — Telephone Encounter (Signed)
Patient has [facility].  Last one was in January.

## 2014-12-10 NOTE — Telephone Encounter (Signed)
Left message for patient to call me back to ask if she has had a mammogram recently.

## 2015-01-04 ENCOUNTER — Other Ambulatory Visit: Payer: Self-pay | Admitting: Internal Medicine

## 2015-01-06 ENCOUNTER — Other Ambulatory Visit: Payer: Self-pay

## 2015-03-04 DIAGNOSIS — J309 Allergic rhinitis, unspecified: Secondary | ICD-10-CM | POA: Diagnosis not present

## 2015-03-04 DIAGNOSIS — J370 Chronic laryngitis: Secondary | ICD-10-CM | POA: Diagnosis not present

## 2015-03-04 DIAGNOSIS — J4540 Moderate persistent asthma, uncomplicated: Secondary | ICD-10-CM | POA: Diagnosis not present

## 2015-03-04 DIAGNOSIS — R05 Cough: Secondary | ICD-10-CM | POA: Diagnosis not present

## 2015-04-18 ENCOUNTER — Other Ambulatory Visit: Payer: Self-pay

## 2015-04-18 MED ORDER — OMEPRAZOLE 40 MG PO CPDR: 40.0000 mg | DELAYED_RELEASE_CAPSULE | Freq: Two times a day (BID) | ORAL | Status: DC

## 2015-04-18 MED ORDER — FLUTICASONE PROPIONATE 50 MCG/ACT NA SUSP: 1.0000 | Freq: Every day | NASAL | Status: DC

## 2015-04-18 NOTE — Telephone Encounter (Signed)
Patient called requesting refill. Have sent in Refills.

## 2015-05-01 DIAGNOSIS — Z23 Encounter for immunization: Secondary | ICD-10-CM | POA: Diagnosis not present

## 2015-05-19 DIAGNOSIS — M25562 Pain in left knee: Secondary | ICD-10-CM | POA: Diagnosis not present

## 2015-05-27 DIAGNOSIS — M25562 Pain in left knee: Secondary | ICD-10-CM | POA: Diagnosis not present

## 2015-05-29 DIAGNOSIS — M25562 Pain in left knee: Secondary | ICD-10-CM | POA: Diagnosis not present

## 2015-05-31 ENCOUNTER — Other Ambulatory Visit: Payer: Self-pay | Admitting: Family Medicine

## 2015-06-02 NOTE — Telephone Encounter (Signed)
Refill done.  

## 2015-06-24 ENCOUNTER — Ambulatory Visit (INDEPENDENT_AMBULATORY_CARE_PROVIDER_SITE_OTHER): Payer: Medicare Other | Admitting: Allergy and Immunology

## 2015-06-24 ENCOUNTER — Encounter: Payer: Self-pay | Admitting: Allergy and Immunology

## 2015-06-24 VITALS — BP 128/80 | HR 76 | Resp 20

## 2015-06-24 DIAGNOSIS — J387 Other diseases of larynx: Secondary | ICD-10-CM

## 2015-06-24 DIAGNOSIS — J3089 Other allergic rhinitis: Secondary | ICD-10-CM | POA: Diagnosis not present

## 2015-06-24 DIAGNOSIS — J4530 Mild persistent asthma, uncomplicated: Secondary | ICD-10-CM

## 2015-06-24 DIAGNOSIS — K219 Gastro-esophageal reflux disease without esophagitis: Secondary | ICD-10-CM

## 2015-06-24 NOTE — Progress Notes (Signed)
[facility]  Follow-up Note  Refering Provider: Newt Lukes, MD Primary Provider: Rene Paci, MD  Subjective:   Beverly [name] is a 69 y.o. female who [facility] in re-evaluation of the following:  HPI Comments:  Beverly [name] returns to this clinic on 06/24/2015 in reevaluation of her cough with component of LPR, asthma, and nonallergic rhinitis. She was doing quite well on her current medical therapy which includes Qvar, omeprazole, ranitidine, and Flonase but unfortunately since about October she's had some intermittent episodic spells of coughing usually at nighttime that sometimes awaken her. They're accompanied by nasal congestion. In retrospect, she thought that she did much better when using Singulair in addition to her other medications and she would like to restart this medication. She has not had any other significant respiratory tract symptoms such as anosmia or ugly nasal discharge or shortness of breath or chest tightness or sputum production.   Outpatient Encounter Prescriptions as of 06/24/2015  Medication Sig  . fluticasone (FLONASE) 50 MCG/ACT nasal spray Place 1 spray into both nostrils daily.  Marland Kitchen FLUZONE HIGH-DOSE 0.5 ML SUSY FLU SHOT  . Glucosamine-Chondroitin 1500-1200 MG/30ML LIQD Take by mouth daily.    . Multiple Vitamin (MULTIVITAMIN) tablet Take 1 tablet by mouth daily.    Marland Kitchen omeprazole (PRILOSEC) 40 MG capsule Take 1 capsule (40 mg total) by mouth 2 (two) times daily.  . Probiotic Product (SOLUBLE FIBER/PROBIOTICS PO) Take by mouth.  Marland Kitchen QVAR 40 MCG/ACT inhaler Inhale 1 Dose into the lungs 2 (two) times daily.  . traMADol (ULTRAM) 50 MG tablet take 1 tablet by mouth every 12 hours if needed  . Loperamide-Simethicone (IMODIUM ADVANCED) 2-125 MG TABS Take 1 tablet by mouth daily as needed.    . naproxen sodium (ANAPROX) 220 MG tablet Take 220 mg by mouth as needed.  . NON  FORMULARY 180 mg. Allergy Relief take 1 tablet daily   No facility-administered encounter medications on file as of 06/24/2015.    No orders of the defined types were placed in this encounter.    Past Medical History  Diagnosis Date  . Allergic rhinitis   . Esophageal reflux   . Chronic cough   . IBS (irritable bowel syndrome)   . Osteopenia 03/14/2013    DEXA  02/2013: -1.8  . Allergy   . Anemia   . Cataract     bilateral cataracts removed    Past Surgical History  Procedure Laterality Date  . Foot surgery      right  . Tubal ligation    . Breast lumpectomy      bilateral  . Laparoscopic cholecystectomy  11/2009  . Bilateral cateracts removed    . Right foot bunionectomy      No Known Allergies  Review of Systems  Constitutional: Negative for fever, chills and fatigue.  HENT: Positive for congestion. Negative for ear pain, facial swelling, hearing loss, nosebleeds, postnasal drip, rhinorrhea, sinus pressure, sneezing, sore throat, tinnitus, trouble swallowing and voice change.   Eyes: Negative for pain, discharge, redness and itching.  Respiratory: Positive for cough. Negative for chest tightness, shortness of breath and wheezing.   Cardiovascular: Negative for chest pain and leg swelling.  Gastrointestinal: Negative for nausea, vomiting and abdominal pain.  Endocrine: Negative for cold intolerance and heat intolerance.  Musculoskeletal: Negative for myalgias and arthralgias.  Skin: Negative for rash.  Allergic/Immunologic: Negative.   Neurological: Negative for dizziness and  headaches.  Hematological: Negative for adenopathy.     Objective:   Filed Vitals:   06/24/15 1627  BP: 128/80  Pulse: 76  Resp: 20          Physical Exam  Constitutional: She appears well-developed and well-nourished. No distress.  HENT:  Head: Normocephalic and atraumatic. Head is without right periorbital erythema and without left periorbital erythema.  Right Ear: Tympanic  membrane, external ear and ear canal normal. No drainage or tenderness. No foreign bodies. Tympanic membrane is not injected, not scarred, not perforated, not erythematous, not retracted and not bulging. No middle ear effusion.  Left Ear: Tympanic membrane, external ear and ear canal normal. No drainage or tenderness. No foreign bodies. Tympanic membrane is not injected, not scarred, not perforated, not erythematous, not retracted and not bulging.  No middle ear effusion.  Nose: Nose normal. No mucosal edema, rhinorrhea, nose lacerations or sinus tenderness.  No foreign bodies.  Mouth/Throat: Oropharynx is clear and moist. No oropharyngeal exudate, posterior oropharyngeal edema, posterior oropharyngeal erythema or tonsillar abscesses.  Eyes: Lids are normal. Right eye exhibits no chemosis, no discharge and no exudate. No foreign body present in the right eye. Left eye exhibits no chemosis, no discharge and no exudate. No foreign body present in the left eye. Right conjunctiva is not injected. Left conjunctiva is not injected.  Neck: Neck supple. No tracheal tenderness present. No tracheal deviation and no edema present. No thyroid mass and no thyromegaly present.  Cardiovascular: Normal rate, regular rhythm, S1 normal and S2 normal.  Exam reveals no gallop.   No murmur heard. Pulmonary/Chest: No accessory muscle usage or stridor. No respiratory distress. She has no wheezes. She has no rhonchi. She has no rales.  Abdominal: Soft.  Lymphadenopathy:       Head (right side): No tonsillar adenopathy present.       Head (left side): No tonsillar adenopathy present.    She has no cervical adenopathy.  Neurological: She is alert.  Skin: No rash noted. She is not diaphoretic.  Psychiatric: She has a normal mood and affect. Her behavior is normal.    Diagnostics:    Spirometry was performed and demonstrated an FEV1 of 1.89 at 83 % of predicted.  The patient had an Asthma Control Test with the following  results: ACT Total Score: 18.    Assessment and Plan:   1. Mild persistent asthma, uncomplicated   2. LPRD (laryngopharyngeal reflux disease)   3. Other allergic rhinitis      1. Restart montelukast 10 mg one tablet one time per day  2. Increase Qvar 40 to 2 inhalations twice a day for the next 4 weeks  3. Increase Flonase to one spray each nostril twice a day for next 4 weeks  4. Continue omeprazole 40 mg twice a day and ranitidine 300 mg in the evening  5. Continue Zyrtec and ProAir if needed  6. Call clinic with update in 4 weeks  7. Return to clinic in 6 months or earlier if problem  Obviously [name] is had any flare of her irritated respiratory tract giving rise to coughing. He still is somewhat confusing about which of her 3 Major Issues plays the greater role in her cough. I will treat her for inflammation by having her increase her Qvar restart her montelukast and increase her Flonase while continuing to have her use the same dose of medications directed against her reflux. She will keep in contact with me noting her response to this  approach and we'll make a decision about how to proceed over the course the next several weeks.   Laurette Schimke, MD [facility]

## 2015-06-24 NOTE — Patient Instructions (Signed)
  1. Restart montelukast 10 mg one tablet one time per day  2. Increase Qvar 40 to 2 inhalations twice a day for the next 4 weeks  3. Increase Flonase to one spray each nostril twice a day for next 4 weeks  4. Continue omeprazole 40 mg twice a day and ranitidine 300 mg in the evening  5. Continue Zyrtec and ProAir if needed  6. Call clinic with update in 4 weeks  7. Return to clinic in 6 months or earlier if problem

## 2015-06-30 ENCOUNTER — Other Ambulatory Visit: Payer: Self-pay | Admitting: Allergy and Immunology

## 2015-06-30 ENCOUNTER — Other Ambulatory Visit: Payer: Self-pay | Admitting: Neurology

## 2015-06-30 MED ORDER — FAMOTIDINE 20 MG PO TABS: 20.0000 mg | ORAL_TABLET | Freq: Every day | ORAL | Status: DC

## 2015-06-30 NOTE — Telephone Encounter (Signed)
Received fax from patients pharmacy and just have sent in refills to patients pharmacy. Patient notified.

## 2015-06-30 NOTE — Telephone Encounter (Signed)
Pt is very upset because her rx was not called in after her visit. She needs the Famotidine called aasp because has been without it all wekend. [phone]. [facility] [address] [phone].

## 2015-07-08 ENCOUNTER — Telehealth: Payer: Self-pay

## 2015-07-08 NOTE — Telephone Encounter (Signed)
Patient called to educate on Medicare Wellness apt. LVM for the patient to call back to educate and schedule for wellness visit. Left VM for cal back; Seeing Dr. Katrinka BlazingSmith, has not scheduled CPE this year

## 2015-07-11 NOTE — Telephone Encounter (Signed)
Call to Beverly [name] and stated she has a minute. Educated regarding AWV and to see if she would care to schedule AWV or an apt with another practitioner;  States her GYN is also retiring and she is not sure what she wants to do. Will have to think about it and will call back.

## 2015-10-14 ENCOUNTER — Other Ambulatory Visit: Payer: Self-pay | Admitting: *Deleted

## 2015-10-14 MED ORDER — TRAMADOL HCL 50 MG PO TABS: ORAL_TABLET | ORAL | Status: DC

## 2015-10-14 NOTE — Telephone Encounter (Signed)
Phoned tramadol into pharmacy.

## 2015-12-10 ENCOUNTER — Other Ambulatory Visit: Payer: Self-pay

## 2015-12-10 MED ORDER — OMEPRAZOLE 40 MG PO CPDR: 40.0000 mg | DELAYED_RELEASE_CAPSULE | Freq: Two times a day (BID) | ORAL | Status: DC

## 2015-12-25 DIAGNOSIS — Z23 Encounter for immunization: Secondary | ICD-10-CM | POA: Diagnosis not present

## 2016-01-08 DIAGNOSIS — Z1231 Encounter for screening mammogram for malignant neoplasm of breast: Secondary | ICD-10-CM | POA: Diagnosis not present

## 2016-01-08 DIAGNOSIS — N951 Menopausal and female climacteric states: Secondary | ICD-10-CM | POA: Diagnosis not present

## 2016-01-27 ENCOUNTER — Ambulatory Visit: Payer: BLUE CROSS/BLUE SHIELD | Admitting: Family Medicine

## 2016-01-28 ENCOUNTER — Encounter: Payer: Self-pay | Admitting: Family Medicine

## 2016-01-28 ENCOUNTER — Ambulatory Visit (INDEPENDENT_AMBULATORY_CARE_PROVIDER_SITE_OTHER): Payer: Medicare Other | Admitting: Family Medicine

## 2016-01-28 VITALS — BP 120/78 | HR 60 | Wt 125.0 lb

## 2016-01-28 DIAGNOSIS — M9904 Segmental and somatic dysfunction of sacral region: Secondary | ICD-10-CM | POA: Diagnosis not present

## 2016-01-28 DIAGNOSIS — M999 Biomechanical lesion, unspecified: Secondary | ICD-10-CM

## 2016-01-28 DIAGNOSIS — M9902 Segmental and somatic dysfunction of thoracic region: Secondary | ICD-10-CM | POA: Diagnosis not present

## 2016-01-28 DIAGNOSIS — G8929 Other chronic pain: Secondary | ICD-10-CM

## 2016-01-28 DIAGNOSIS — M9903 Segmental and somatic dysfunction of lumbar region: Secondary | ICD-10-CM | POA: Diagnosis not present

## 2016-01-28 DIAGNOSIS — M545 Low back pain, unspecified: Secondary | ICD-10-CM

## 2016-01-28 MED ORDER — GABAPENTIN 100 MG PO CAPS: 200.0000 mg | ORAL_CAPSULE | Freq: Every day | ORAL | Status: DC

## 2016-01-28 NOTE — Patient Instructions (Signed)
Good to see you  Ie is your friend Tramadol only if you really need it Gabapentin 200mg  at night.  If too groggy in AM try then try 1 pill at night One day of pilates See me again in 6 weeks

## 2016-01-28 NOTE — Assessment & Plan Note (Signed)
Decision today to treat with OMT was based on Physical Exam  After verbal consent patient was treated with HVLA, muscle energy techniques in thoracic, lumbar and sacral areas  Patient tolerated the procedure well with improvement in symptoms  Patient given exercises, stretches and lifestyle modifications  See medications in patient instructions if given  Patient will follow up in 6 weeks

## 2016-01-28 NOTE — Assessment & Plan Note (Signed)
Patient does have low back pain, sacroiliac joint dysfunction and bury mild scoliosis. Discussed with patient to continue the yoga as well as the Pilates now. We discussed gabapentin at night and may be beneficial with that being most of her pain. Tramadol as needed still. We discussed icing regimen. Patient come back and see me again in 6-8 weeks for further evaluation and treatment.

## 2016-01-28 NOTE — Progress Notes (Signed)
[name] [address] [place], [place] [address] Phone: [phone] Subjective:     CC: Back pain follow up  BJY:NWGNFAOZHY FAREEDA DOWNARD is a 70 y.o. female coming in with complaint of back pain.   Patient has also been diagnosed with a sacroiliac joint dysfunction. Patient also has some mild scoliosis. Patient has not been seen for 5 months. Worsening pain. Patient states that she is having more difficulty at night. States that she has to do significant number of stretches before she goes to bed. 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 Patient denies any radiation down the legs. Patient states that she has had time to take tramadol at night. States she is not use it during the day. Things like worse back writing makes it worse.  Past Medical History  Diagnosis Date  . Allergic rhinitis   . Esophageal reflux   . Chronic cough   . IBS (irritable bowel syndrome)   . Osteopenia 03/14/2013    DEXA  02/2013: -1.8  . Allergy   . Anemia   . Cataract     bilateral cataracts removed   Past Surgical History  Procedure Laterality Date  . Foot surgery      right  . Tubal ligation    . Breast lumpectomy      bilateral  . Laparoscopic cholecystectomy  11/2009  . Bilateral cateracts removed    . Right foot bunionectomy     Social History  Substance Use Topics  . Smoking status: Never Smoker   . Smokeless tobacco: Never Used  . Alcohol Use: 4.2 oz/week    7 Glasses of wine per week   No Known Allergies  Family History  Problem Relation Age of Onset  . Pancreatic cancer Mother   . Heart disease Father   . Hypertension Brother   . Hyperlipidemia Brother   . Colon cancer Maternal Grandfather 17  . Esophageal cancer Neg Hx   . Rectal cancer Neg Hx   . Stomach cancer Neg Hx          Past medical history, social, surgical and family history all reviewed in electronic medical record.   Review of Systems: No headache, visual changes,  nausea, vomiting, diarrhea, constipation, dizziness, abdominal pain, skin rash, fevers, chills, night sweats, weight loss, swollen lymph nodes, body aches, joint swelling, muscle aches, chest pain, shortness of breath, mood changes.   Objective Blood pressure 120/78, pulse 60, weight 125 lb (56.7 kg).  General: No apparent distress alert and oriented x3 mood and affect normal, dressed appropriately.  HEENT: Pupils equal, extraocular movements intact  Respiratory: Patient's speak in full sentences and does not appear short of breath  Cardiovascular: No lower extremity edema, non tender, no erythema  Skin: Warm dry intact with no signs of infection or rash on extremities or on axial skeleton.  Abdomen: Soft nontender  Neuro: Cranial nerves II through XII are intact, neurovascularly intact in all extremities with 2+ DTRs and 2+ pulses.  Lymph: No lymphadenopathy of posterior or anterior cervical chain or axillae bilaterally.  Gait normal with good balance and coordination.  MSK:  Non tender with full range of motion and good stability and symmetric strength and tone of shoulders, elbows, wrist, hip, knee and ankles bilaterally.  Back Exam:  Inspection: Mild increasing kyphosis and mild dextroscoliosis of the lumbar spine Motion: Flexion 45 deg, Extension 25 deg, Side Bending to 35 deg bilaterally,  Rotation to 35 deg bilaterally  SLR  laying: Negative  XSLR laying: Negative  Palpable tenderness:mild tenderness of the paraspinal musculature on the left side lumbar spine as well as the left scapular region FABER: negative. Sensory change: Gross sensation intact to all lumbar and sacral dermatomes.  Reflexes: 2+ at both patellar tendons, 2+ at achilles tendons, Babinski's downgoing.  Strength at foot  Plantar-flexion: 5/5 Dorsi-flexion: 5/5 Eversion: 5/5 Inversion: 5/5  Leg strength  Quad: 5/5 Hamstring: 5/5 Hip flexor: 5/5 Hip abductors: 4/5  Gait unremarkable. No change from previous exam.    OMT Physical Exam   Cervical  C6 flexed rotated and side bent right  Thoracic T3 extended rotated and side bent right T7 extended rotated and side bent left  Lumbar  L2 flexed rotated and side bent right L4 flexed rotated and side bent left Sacrum Right on right    Impression and Recommendations:     This case required medical decision making of moderate complexity.

## 2016-02-09 DIAGNOSIS — D559 Anemia due to enzyme disorder, unspecified: Secondary | ICD-10-CM | POA: Diagnosis not present

## 2016-02-09 DIAGNOSIS — E7800 Pure hypercholesterolemia, unspecified: Secondary | ICD-10-CM | POA: Diagnosis not present

## 2016-02-09 DIAGNOSIS — E785 Hyperlipidemia, unspecified: Secondary | ICD-10-CM | POA: Diagnosis not present

## 2016-02-09 DIAGNOSIS — E039 Hypothyroidism, unspecified: Secondary | ICD-10-CM | POA: Diagnosis not present

## 2016-02-16 ENCOUNTER — Other Ambulatory Visit: Payer: Self-pay | Admitting: Allergy and Immunology

## 2016-02-19 ENCOUNTER — Other Ambulatory Visit: Payer: Self-pay | Admitting: Allergy and Immunology

## 2016-03-04 ENCOUNTER — Other Ambulatory Visit: Payer: Self-pay | Admitting: Allergy and Immunology

## 2016-03-04 MED ORDER — QVAR 40 MCG/ACT IN AERS: 2.0000 | INHALATION_SPRAY | Freq: Two times a day (BID) | RESPIRATORY_TRACT | 0 refills | Status: DC

## 2016-03-04 NOTE — Telephone Encounter (Signed)
Sent rx for qvar 1 month pt notified

## 2016-03-04 NOTE — Telephone Encounter (Signed)
Pt called and said that we would not fill her qvar and she has appointment on sept 13. And that was the soon as one we had.she was told that there would not be a problem filling her rx.  [facility] [address]. [phone].

## 2016-03-10 ENCOUNTER — Ambulatory Visit: Payer: Medicare Other | Admitting: Family Medicine

## 2016-03-17 ENCOUNTER — Other Ambulatory Visit: Payer: Self-pay | Admitting: Allergy and Immunology

## 2016-03-24 ENCOUNTER — Ambulatory Visit (INDEPENDENT_AMBULATORY_CARE_PROVIDER_SITE_OTHER): Payer: Medicare Other | Admitting: Allergy and Immunology

## 2016-03-24 ENCOUNTER — Encounter (INDEPENDENT_AMBULATORY_CARE_PROVIDER_SITE_OTHER): Payer: Self-pay

## 2016-03-24 ENCOUNTER — Other Ambulatory Visit: Payer: Self-pay | Admitting: Allergy and Immunology

## 2016-03-24 ENCOUNTER — Encounter: Payer: Self-pay | Admitting: Allergy and Immunology

## 2016-03-24 VITALS — BP 104/78 | HR 68 | Resp 16

## 2016-03-24 DIAGNOSIS — J387 Other diseases of larynx: Secondary | ICD-10-CM | POA: Diagnosis not present

## 2016-03-24 DIAGNOSIS — J4530 Mild persistent asthma, uncomplicated: Secondary | ICD-10-CM | POA: Diagnosis not present

## 2016-03-24 DIAGNOSIS — J3089 Other allergic rhinitis: Secondary | ICD-10-CM

## 2016-03-24 DIAGNOSIS — K219 Gastro-esophageal reflux disease without esophagitis: Secondary | ICD-10-CM

## 2016-03-24 NOTE — Progress Notes (Signed)
Follow-up Note  Referring Provider: Newt Lukes, MD Primary Provider: Rene Paci, MD Date of Office Visit: 03/24/2016  Subjective:   Beverly [name] (DOB: Apr 12, 1946) is a 70 y.o. female who [facility] in re-evaluation of the following:  HPI: Beverly [name] returns to this clinic in reevaluation of her chronic cough with a component of LPR and asthma and nonallergic rhinitis. She still continues to cough although certainly not as bad as prior to starting her medical plan. She still has coughing upon awakening in the morning. If she eats chocolate she coughs like crazy. She's also noticed that she has woken up many times at night with some coughing noting that she actually has an alcohol sensation in her throat. She does drink wine every night around dinnertime. She's very good about avoiding caffeine consumption and chocolate consumption for the most part. Beverly [name] never uses a short acting bronchodilator. She has not required a systemic steroid and antibiotic to treat any type of flare with her upper or lower airway. She does continue on montelukast and Qvar and Flonase and omeprazole and ranitidine.    Medication List      famotidine 20 MG tablet Commonly known as:  PEPCID Take 1 tablet (20 mg total) by mouth daily.   fluticasone 50 MCG/ACT nasal spray Commonly known as:  FLONASE Place 1 spray into both nostrils daily.   GLUCOSAMINE-CHONDROITIN PO Take by mouth.   IMODIUM ADVANCED 2-125 MG Tabs Generic drug:  Loperamide-Simethicone Take 1 tablet by mouth daily as needed.   multivitamin tablet Take 1 tablet by mouth daily.   NON FORMULARY 180 mg. Allergy Relief take 1 tablet daily   omeprazole 40 MG capsule Commonly known as:  PRILOSEC take 1 capsule by mouth twice a day   PREMARIN vaginal cream Generic drug:  conjugated estrogens Place 1 Applicatorful vaginally 2 (two) times a week.   QVAR 40 MCG/ACT inhaler Generic  drug:  beclomethasone Inhale 2 puffs into the lungs 2 (two) times daily.   SOLUBLE FIBER/PROBIOTICS PO Take by mouth.   traMADol 50 MG tablet Commonly known as:  ULTRAM take 1 tablet by mouth every 12 hours if needed       Past Medical History:  Diagnosis Date  . Allergic rhinitis   . Allergy   . Anemia   . Cataract    bilateral cataracts removed  . Chronic cough   . Esophageal reflux   . IBS (irritable bowel syndrome)   . Osteopenia 03/14/2013   DEXA @[facility]  02/2013: -1.8    Past Surgical History:  Procedure Laterality Date  . bilateral cateracts removed    . BREAST LUMPECTOMY     bilateral  . FOOT SURGERY     right  . LAPAROSCOPIC CHOLECYSTECTOMY  11/2009  . right foot bunionectomy    . TUBAL LIGATION      No Known Allergies  Review of systems negative except as noted in HPI / PMHx or noted below:  Review of Systems  Constitutional: Negative.   HENT: Negative.   Eyes: Negative.   Respiratory: Negative.   Cardiovascular: Negative.   Gastrointestinal: Negative.   Genitourinary: Negative.   Musculoskeletal: Negative.   Skin: Negative.   Neurological: Negative.   Endo/Heme/Allergies: Negative.   Psychiatric/Behavioral: Negative.      Objective:   Vitals:   03/24/16 1042  BP: 104/78  Pulse: 68  Resp: 16          Physical Exam  Constitutional: She is well-developed, well-nourished, and in no distress.  Slight cough  HENT:  Head: Normocephalic.  Right Ear: Tympanic membrane, external ear and ear canal normal.  Left Ear: Tympanic membrane, external ear and ear canal normal.  Nose: Nose normal. No mucosal edema or rhinorrhea.  Mouth/Throat: Uvula is midline, oropharynx is clear and moist and mucous membranes are normal. No oropharyngeal exudate.  Eyes: Conjunctivae are normal.  Neck: Trachea normal. No tracheal tenderness present. No tracheal deviation present. No thyromegaly present.  Cardiovascular: Normal rate, regular rhythm, S1 normal, S2 normal  and normal heart sounds.   No murmur heard. Pulmonary/Chest: Breath sounds normal. No stridor. No respiratory distress. She has no wheezes. She has no rales.  Musculoskeletal: She exhibits no edema.  Lymphadenopathy:       Head (right side): No tonsillar adenopathy present.       Head (left side): No tonsillar adenopathy present.    She has no cervical adenopathy.  Neurological: She is alert. Gait normal.  Skin: No rash noted. She is not diaphoretic. No erythema. Nails show no clubbing.  Psychiatric: Mood and affect normal.    Diagnostics:    Spirometry was performed and demonstrated an FEV1 of 1.81 at 81 % of predicted.  The patient had an Asthma Control Test with the following results: ACT Total Score: 14.    Assessment and Plan:   1. Mild persistent asthma, uncomplicated   2. LPRD (laryngopharyngeal reflux disease)   3. Other allergic rhinitis     1. Continue montelukast 10 mg one tablet one time per day  2. Continue Qvar 40 to 2 inhalations twice a day for the next 4 weeks  3. Continue Flonase to one spray each nostril twice a day for next 4 weeks  4. Continue omeprazole 40 mg twice a day and ranitidine 300 mg or famotidine 20 mg in the evening  5. Continue Zyrtec and ProAir if needed  6. Try a "wine free" 2 week interval.  7. Call clinic with update in 2 weeks  8. Return to clinic in 6 months or earlier if problem  9. Obtain fall flu vaccine  Harriett SineNancy Will try a period of time without consumption of wine to see if this is one of the triggers giving rise to her cough. Given the fact that she does have the sensation of wine coming up in her throat at nighttime I suspect that this is a trigger. She should know within approximately 2 weeks or so whether or not wine is causing a problem and if it is then she needs to make a decision about whether or not she will continue to drink wine or not. I'll continue to have her use anti-inflammatory agents for her respiratory tract as  well and aggressive therapy directed against reflux as noted above. I'll see her back in this clinic in 6 months.  Laurette SchimkeEric Vance Hochmuth, MD [facility]

## 2016-03-24 NOTE — Patient Instructions (Addendum)
  1. Continue montelukast 10 mg one tablet one time per day  2. Continue Qvar 40 to 2 inhalations twice a day for the next 4 weeks  3. Continue Flonase to one spray each nostril twice a day for next 4 weeks  4. Continue omeprazole 40 mg twice a day and ranitidine 300 mg or famotidine 20 mg in the evening  5. Continue Zyrtec and ProAir if needed  6. Try a "wine free" 2 week interval.  7. Call clinic with update in 2 weeks  8. Return to clinic in 6 months or earlier if problem  9. Obtain fall flu vaccine

## 2016-03-29 ENCOUNTER — Other Ambulatory Visit: Payer: Self-pay | Admitting: Allergy and Immunology

## 2016-04-19 ENCOUNTER — Other Ambulatory Visit: Payer: Self-pay | Admitting: Allergy and Immunology

## 2016-06-01 ENCOUNTER — Telehealth: Payer: Self-pay | Admitting: Allergy and Immunology

## 2016-06-01 NOTE — Telephone Encounter (Signed)
Spoke with patient advised to wait until January 2018 she can keep getting Qvar until then. Patient states drugs covered will be symbicort advair breo advised we would change in January patient verbalized understanding

## 2016-06-01 NOTE — Telephone Encounter (Signed)
Patient called and said she got a letter from her insurance company stating that her Qvar will not be covered in 2018. She would like to know what to do; if there is an alternative.

## 2016-06-07 ENCOUNTER — Telehealth: Payer: Self-pay | Admitting: Allergy and Immunology

## 2016-06-07 NOTE — Telephone Encounter (Signed)
Pt called and would like for a nurse to call her about the Qvar. [phone]. [facility] [address].

## 2016-06-07 NOTE — Telephone Encounter (Signed)
Called patient on both numbers and did not get an answer or an answering machine.

## 2016-07-30 ENCOUNTER — Telehealth: Payer: Self-pay

## 2016-07-30 ENCOUNTER — Other Ambulatory Visit: Payer: Self-pay

## 2016-07-30 NOTE — Telephone Encounter (Signed)
Pt clld in re Qvar coverage with her new insurance.   Advsd pt to contact her new insurance to verify whether or not coverage will be approved due to our office not having her new insurance information.  Pt stated she would.

## 2016-07-30 NOTE — Telephone Encounter (Signed)
Erroneous encounter

## 2016-09-16 ENCOUNTER — Other Ambulatory Visit: Payer: Self-pay | Admitting: Allergy and Immunology

## 2016-09-16 ENCOUNTER — Telehealth: Payer: Self-pay | Admitting: Allergy and Immunology

## 2016-09-16 ENCOUNTER — Other Ambulatory Visit: Payer: Self-pay

## 2016-09-16 MED ORDER — FLUTICASONE PROPIONATE HFA 44 MCG/ACT IN AERO: 2.0000 | INHALATION_SPRAY | Freq: Two times a day (BID) | RESPIRATORY_TRACT | 0 refills | Status: DC

## 2016-09-16 NOTE — Telephone Encounter (Signed)
Called patient left message to call back. We need to let patient know that [facility] [address]. Qvar is no longer available.

## 2016-09-16 NOTE — Telephone Encounter (Signed)
Pt called and made appointment for 10/05/2016 but she is out of her Qvar. [facility] [address] number there is [phone] and her number is [phone].

## 2016-09-16 NOTE — Telephone Encounter (Signed)
Patient called back,i informed her that we sent a script for Flovent 44 to [facility] [address]. Also explained that qvar is no longer available.

## 2016-10-04 DIAGNOSIS — R197 Diarrhea, unspecified: Secondary | ICD-10-CM | POA: Diagnosis not present

## 2016-10-04 DIAGNOSIS — E039 Hypothyroidism, unspecified: Secondary | ICD-10-CM | POA: Diagnosis not present

## 2016-10-04 DIAGNOSIS — R61 Generalized hyperhidrosis: Secondary | ICD-10-CM | POA: Diagnosis not present

## 2016-10-04 DIAGNOSIS — M545 Low back pain: Secondary | ICD-10-CM | POA: Diagnosis not present

## 2016-10-05 ENCOUNTER — Ambulatory Visit (INDEPENDENT_AMBULATORY_CARE_PROVIDER_SITE_OTHER): Payer: Medicare Other | Admitting: Allergy and Immunology

## 2016-10-05 ENCOUNTER — Encounter: Payer: Self-pay | Admitting: Allergy and Immunology

## 2016-10-05 VITALS — BP 130/70 | HR 64 | Resp 16

## 2016-10-05 DIAGNOSIS — IMO0001 Reserved for inherently not codable concepts without codable children: Secondary | ICD-10-CM

## 2016-10-05 DIAGNOSIS — K219 Gastro-esophageal reflux disease without esophagitis: Secondary | ICD-10-CM

## 2016-10-05 DIAGNOSIS — J3089 Other allergic rhinitis: Secondary | ICD-10-CM | POA: Diagnosis not present

## 2016-10-05 DIAGNOSIS — J4530 Mild persistent asthma, uncomplicated: Secondary | ICD-10-CM

## 2016-10-05 DIAGNOSIS — H918X2 Other specified hearing loss, left ear: Secondary | ICD-10-CM

## 2016-10-05 MED ORDER — FLUTICASONE PROPIONATE 50 MCG/ACT NA SUSP: 1.0000 | Freq: Every day | NASAL | 3 refills | Status: DC

## 2016-10-05 MED ORDER — MONTELUKAST SODIUM 10 MG PO TABS: 10.0000 mg | ORAL_TABLET | Freq: Every day | ORAL | 1 refills | Status: DC

## 2016-10-05 MED ORDER — OMEPRAZOLE 40 MG PO CPDR: 40.0000 mg | DELAYED_RELEASE_CAPSULE | Freq: Two times a day (BID) | ORAL | 1 refills | Status: DC

## 2016-10-05 NOTE — Patient Instructions (Addendum)
  1. Continue montelukast 10 mg one tablet one time per day  2. Change Qvar to FLOVENT 44 - 2 inhalations twice a day with spacer  3. Continue Flonase to one spray each nostril twice a day    4. Continue omeprazole 40 mg twice a day and ranitidine 300 mg or famotidine 20 mg in the evening  5. Continue Zyrtec and ProAir if needed  6. REPLACE THROAT CLEARING WITH SWALLOWING  7. Return to clinic in 6 months or earlier if problem  8. Visit with ENT to investigate left hearing loss

## 2016-10-05 NOTE — Progress Notes (Signed)
Follow-up Note  Referring Provider: No ref. provider found Primary Provider: Enrique SackGREEN, EDWIN JAY, MD Date of Office Visit: 10/05/2016  Subjective:   Beverly [name] (DOB: 03/31/1946) is a 71 y.o. female who [facility] in re-evaluation of the following:  HPI: Beverly [name] returns to this clinic in reevaluation of her chronic cough with a component of LPR and asthma and nonallergic rhinitis. I last saw her in this clinic in September 2017.  Overall she has done well in that she has not required a systemic steroid and antibiotic to treat any type of respiratory tract issue. However, she thinks that she has a lot of cough since January. She has a lot of throat clearing as well. She has no chest tightness and no shortness of breath. She performs in yoga and horse riding at least 3 times a week without any problem. She's down to drinking one wine per day and has no caffeine and rarely has chocolate. She will awaken at about 3 AM in the morning and take a glass of water and sip from that glass and then her cough resolves and then she goes back to bed. She's had no other associated systemic or constitutional symptoms.  It should be noted that she discontinued her Qvar in December. She continues to use omeprazole and ranitidine or famotidine and she continues to use Flonase. Rarely does she use any pro-air.  She did have a problem with night sweats for which she was started on hormone replacement therapy over the course the past 6 months which has resulted in resolution of this issue.  She also states that she has had some muffled hearing out of her right ear over the course of the past several years. She has no associated ringing or vertigo.  She did obtain a flu vaccine this year.  Allergies as of 10/05/2016   No Known Allergies     Medication List      famotidine 20 MG tablet Commonly known as:  PEPCID Take 1 tablet (20 mg total) by mouth daily.     fluticasone 44 MCG/ACT inhaler Commonly known as:  FLOVENT HFA Inhale 2 puffs into the lungs 2 (two) times daily.   fluticasone 50 MCG/ACT nasal spray Commonly known as:  FLONASE Place 1 spray into both nostrils daily.   GLUCOSAMINE-CHONDROITIN PO Take by mouth.   IMODIUM ADVANCED 2-125 MG Tabs Generic drug:  Loperamide-Simethicone Take 1 tablet by mouth daily as needed.   montelukast 10 MG tablet Commonly known as:  SINGULAIR take 1 tablet by mouth once daily   multivitamin tablet Take 1 tablet by mouth daily.   NON FORMULARY 180 mg. Allergy Relief take 1 tablet daily   omeprazole 40 MG capsule Commonly known as:  PRILOSEC take 1 capsule by mouth twice a day   omeprazole 40 MG capsule Commonly known as:  PRILOSEC take 1 capsule by mouth twice a day   PREMARIN vaginal cream Generic drug:  conjugated estrogens Place 1 Applicatorful vaginally 2 (two) times a week.   QVAR 40 MCG/ACT inhaler Generic drug:  beclomethasone inhale 2 puffs by mouth twice a day USING SPACER AND MASK TO PREVENT cough or wheezing   SOLUBLE FIBER/PROBIOTICS PO Take by mouth.   traMADol 50 MG tablet Commonly known as:  ULTRAM take 1 tablet by mouth every 12 hours if needed       Past Medical History:  Diagnosis Date  . Allergic rhinitis   .  Allergy   . Anemia   . Cataract    bilateral cataracts removed  . Chronic cough   . Esophageal reflux   . IBS (irritable bowel syndrome)   . Osteopenia 03/14/2013   DEXA @[facility]  02/2013: -1.8    Past Surgical History:  Procedure Laterality Date  . bilateral cateracts removed    . BREAST LUMPECTOMY     bilateral  . FOOT SURGERY     right  . LAPAROSCOPIC CHOLECYSTECTOMY  11/2009  . right foot bunionectomy    . TUBAL LIGATION      Review of systems negative except as noted in HPI / PMHx or noted below:  Review of Systems  Constitutional: Negative.   HENT: Negative.   Eyes: Negative.   Respiratory: Negative.   Cardiovascular: Negative.    Gastrointestinal: Negative.   Genitourinary: Negative.   Musculoskeletal: Negative.   Skin: Negative.   Neurological: Negative.   Endo/Heme/Allergies: Negative.   Psychiatric/Behavioral: Negative.      Objective:   Vitals:   10/05/16 1510  BP: 130/70  Pulse: 64  Resp: 16          Physical Exam  Constitutional: She is well-developed, well-nourished, and in no distress.  Incessant throat clearing  HENT:  Head: Normocephalic.  Right Ear: Tympanic membrane, external ear and ear canal normal.  Left Ear: Tympanic membrane, external ear and ear canal normal.  Nose: Nose normal. No mucosal edema or rhinorrhea.  Mouth/Throat: Uvula is midline, oropharynx is clear and moist and mucous membranes are normal. No oropharyngeal exudate.  Eyes: Conjunctivae are normal.  Neck: Trachea normal. No tracheal tenderness present. No tracheal deviation present. No thyromegaly present.  Cardiovascular: Normal rate, regular rhythm, S1 normal, S2 normal and normal heart sounds.   No murmur heard. Pulmonary/Chest: Breath sounds normal. No stridor. No respiratory distress. She has no wheezes. She has no rales.  Musculoskeletal: She exhibits no edema.  Lymphadenopathy:       Head (right side): No tonsillar adenopathy present.       Head (left side): No tonsillar adenopathy present.    She has no cervical adenopathy.  Neurological: She is alert. Gait normal.  Skin: No rash noted. She is not diaphoretic. No erythema. Nails show no clubbing.  Psychiatric: Mood and affect normal.    Diagnostics:    Spirometry was performed and demonstrated an FEV1 of 1.71 at 77 % of predicted.  Assessment and Plan:   1. Mild persistent asthma, uncomplicated   2. LPRD (laryngopharyngeal reflux disease)   3. Other allergic rhinitis   4. Asymmetrical hearing loss, left     1. Continue montelukast 10 mg one tablet one time per day  2. Change Qvar to FLOVENT 44 - 2 inhalations twice a day with spacer  3.  Continue Flonase to one spray each nostril twice a day    4. Continue omeprazole 40 mg twice a day and ranitidine 300 mg or famotidine 20 mg in the evening  5. Continue Zyrtec and ProAir if needed  6. REPLACE THROAT CLEARING WITH SWALLOWING  7. Return to clinic in 6 months or earlier if problem  8. Visit with ENT to investigate left hearing loss  I will assume that Mattea's still continues to have problems with both inflammation of her airway and reflux-induced respiratory disease and have her restart an inhaled steroid at the same time that she replaces her throat clearing with swallowing maneuver and continues on therapy directed against reflux and upper airway inflammation as noted  above. She will keep in contact with me noting her response to this approach and if she does well I will see her back in this clinic in 6 months or earlier if there is a problem. I did mention to her that she should visit with a ENT doctor to have her left hearing loss investigated.  Laurette Schimke, MD Allergy / Immunology  [facility]

## 2016-10-06 ENCOUNTER — Other Ambulatory Visit: Payer: Self-pay | Admitting: Allergy and Immunology

## 2016-10-06 MED ORDER — FLUTICASONE PROPIONATE HFA 44 MCG/ACT IN AERO: 2.0000 | INHALATION_SPRAY | Freq: Two times a day (BID) | RESPIRATORY_TRACT | 5 refills | Status: DC

## 2016-10-06 NOTE — Telephone Encounter (Signed)
Patient claims she was seen yesterday by KOZLOW and a new script was sent in but no refills we attached Please call pt to answer her questions

## 2016-10-06 NOTE — Telephone Encounter (Signed)
Script sent into pharmacy with refills. Patient aware.

## 2016-10-16 ENCOUNTER — Other Ambulatory Visit: Payer: Self-pay | Admitting: Allergy and Immunology

## 2016-10-21 ENCOUNTER — Ambulatory Visit (INDEPENDENT_AMBULATORY_CARE_PROVIDER_SITE_OTHER): Payer: Medicare Other | Admitting: Sports Medicine

## 2016-10-21 DIAGNOSIS — M79671 Pain in right foot: Secondary | ICD-10-CM | POA: Diagnosis not present

## 2016-10-21 NOTE — Progress Notes (Signed)
Beverly [name] - 71 y.o. female MRN [id number]  Date of birth: April 02, 1946  SUBJECTIVE:  Including CC & ROS.  CC: bilateral foot plan, R>L   Beverly [name] is a 71 yo female presenting with bilateral forefoot pain, right worse than left. She reports that she has had pain in her forefoot for the past 3 years, but it has gotten worse over the years to the point where she cannot have her foot touch the sheets or the mattress at night. She had a right bunionectomy 15-20 years ago and since that point her first toe has not touched the ground. 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 She reports that it feels like there is a stone in her shoe. She switches shoes often during the day to try to provide relief and pads her shoes as much as she cans. There are many times when her right foot feels warmer than her left.  She is still very active. She does horseback riding and yoga frequently.   ROS: No unexpected weight loss, fever, chills, swelling, instability, muscle pain, numbness/tingling, redness, otherwise see HPI   PMHx - Updated and reviewed.  Contributory factors include: Negative PSHx - Updated and reviewed.  Contributory factors include:  Negative FHx - Updated and reviewed.  Contributory factors include:  Negative Social Hx - Updated and reviewed. Contributory factors include: Negative Medications - reviewed   DATA REVIEWED: None  PHYSICAL EXAM:  VS: BP:120/74  HR: bpm  TEMP: ( )  RESP:   HT:5\' 5"  (165.1 cm)   WT:123 lb (55.8 kg)  BMI:20.5 PHYSICAL EXAM: Gen: NAD, alert, cooperative with exam, well-appearing HEENT: clear conjunctiva,  CV:  no edema, capillary refill brisk, normal rate Resp: non-labored Skin: no rashes, normal turgor  Neuro: no gross deficits.  Psych:  alert and oriented  R Foot: No visible erythema. 1st metatarsal drop evident on inspection with callous over 1st metatarsal. Varus deviation of 2nd phalange,  splaying between 1st and 2nd phalange. Range of motion is full in all directions. Strength is 5/5 in all directions. Tenderness to palpation over 1st and 2nd metatarsal joints.  No tenderness palpation over interphalangeal joints No pes cavus or pes planus. No pain with compression of metatarsal heads. Neurovascularly intact distally.  Standing exam: 1st metatarsal is off the ground. Longitudinal arch intact.  L foot: No visible erythema or swelling.  Range of motion is full in all directions. Strength is 5/5 in all directions. No tenderness to palpation over metatarsal joints, interphalangeal joints No pes cavus or pes planus. No pain with compression of metatarsal heads. Neurovascularly intact distally.   ASSESSMENT & PLAN:   Bilateral metatarsalgia, right more prominent than left Likely secondary to structural changes secondary to bunionectomy 15-20 years ago that caused 1st phalange to elevate off ground. 1st metatarsal drop evident on inspection with callous over 1st metatarsal, varus deviation of 2nd phalange, splaying between 1st and 2nd phalange. She likely is also getting pain in the medial bursa that is contributing to her nighttime pain.  Although she does not have as prominent deformities on her left side, a metatarsal pad will likely relief the pain in her left foot as well.   Plan: - attempt to relieve pain with green inserts with metatarsal pads, bunion shield, toe loop - recommend using ice when her foot felt warm from inflammation - return in 1 month  Patient seen and evaluated with the resident. I agree with  the above plan of care. Patient is given some green sports insoles and metatarsal pads as well as a bunion shield and a toe loop. She will try these in her various shoes and will follow-up in one month with her feedback.

## 2016-11-03 DIAGNOSIS — H43813 Vitreous degeneration, bilateral: Secondary | ICD-10-CM | POA: Diagnosis not present

## 2016-11-03 DIAGNOSIS — H43393 Other vitreous opacities, bilateral: Secondary | ICD-10-CM | POA: Diagnosis not present

## 2016-11-03 DIAGNOSIS — Z961 Presence of intraocular lens: Secondary | ICD-10-CM | POA: Diagnosis not present

## 2016-11-03 DIAGNOSIS — H31093 Other chorioretinal scars, bilateral: Secondary | ICD-10-CM | POA: Diagnosis not present

## 2016-11-03 DIAGNOSIS — H35373 Puckering of macula, bilateral: Secondary | ICD-10-CM | POA: Diagnosis not present

## 2016-11-03 DIAGNOSIS — H26492 Other secondary cataract, left eye: Secondary | ICD-10-CM | POA: Diagnosis not present

## 2016-11-11 ENCOUNTER — Other Ambulatory Visit: Payer: Self-pay | Admitting: Obstetrics & Gynecology

## 2016-11-11 DIAGNOSIS — Z1231 Encounter for screening mammogram for malignant neoplasm of breast: Secondary | ICD-10-CM

## 2016-11-18 ENCOUNTER — Ambulatory Visit: Payer: Medicare Other | Admitting: Sports Medicine

## 2016-12-08 ENCOUNTER — Ambulatory Visit (INDEPENDENT_AMBULATORY_CARE_PROVIDER_SITE_OTHER): Payer: Medicare Other | Admitting: Orthopedic Surgery

## 2016-12-08 DIAGNOSIS — M542 Cervicalgia: Secondary | ICD-10-CM | POA: Diagnosis not present

## 2016-12-08 DIAGNOSIS — S46012A Strain of muscle(s) and tendon(s) of the rotator cuff of left shoulder, initial encounter: Secondary | ICD-10-CM | POA: Diagnosis not present

## 2016-12-10 DIAGNOSIS — M25512 Pain in left shoulder: Secondary | ICD-10-CM | POA: Diagnosis not present

## 2016-12-13 ENCOUNTER — Ambulatory Visit: Payer: Medicare Other | Admitting: Sports Medicine

## 2016-12-14 DIAGNOSIS — M7582 Other shoulder lesions, left shoulder: Secondary | ICD-10-CM | POA: Diagnosis not present

## 2017-01-18 ENCOUNTER — Ambulatory Visit: Payer: Medicare Other

## 2017-01-21 ENCOUNTER — Other Ambulatory Visit: Payer: Self-pay | Admitting: Allergy and Immunology

## 2017-02-03 DIAGNOSIS — Z1231 Encounter for screening mammogram for malignant neoplasm of breast: Secondary | ICD-10-CM | POA: Diagnosis not present

## 2017-02-04 DIAGNOSIS — M25612 Stiffness of left shoulder, not elsewhere classified: Secondary | ICD-10-CM | POA: Diagnosis not present

## 2017-02-04 DIAGNOSIS — M6281 Muscle weakness (generalized): Secondary | ICD-10-CM | POA: Diagnosis not present

## 2017-02-04 DIAGNOSIS — M25512 Pain in left shoulder: Secondary | ICD-10-CM | POA: Diagnosis not present

## 2017-02-09 DIAGNOSIS — R61 Generalized hyperhidrosis: Secondary | ICD-10-CM | POA: Diagnosis not present

## 2017-02-09 DIAGNOSIS — M25512 Pain in left shoulder: Secondary | ICD-10-CM | POA: Diagnosis not present

## 2017-02-09 DIAGNOSIS — M545 Low back pain: Secondary | ICD-10-CM | POA: Diagnosis not present

## 2017-02-09 DIAGNOSIS — R197 Diarrhea, unspecified: Secondary | ICD-10-CM | POA: Diagnosis not present

## 2017-02-12 DIAGNOSIS — M25512 Pain in left shoulder: Secondary | ICD-10-CM | POA: Diagnosis not present

## 2017-02-14 DIAGNOSIS — M25512 Pain in left shoulder: Secondary | ICD-10-CM | POA: Diagnosis not present

## 2017-02-16 DIAGNOSIS — M25512 Pain in left shoulder: Secondary | ICD-10-CM | POA: Diagnosis not present

## 2017-02-24 DIAGNOSIS — M25512 Pain in left shoulder: Secondary | ICD-10-CM | POA: Diagnosis not present

## 2017-02-26 DIAGNOSIS — M25512 Pain in left shoulder: Secondary | ICD-10-CM | POA: Diagnosis not present

## 2017-03-10 DIAGNOSIS — M25512 Pain in left shoulder: Secondary | ICD-10-CM | POA: Diagnosis not present

## 2017-03-16 DIAGNOSIS — M25512 Pain in left shoulder: Secondary | ICD-10-CM | POA: Diagnosis not present

## 2017-03-18 DIAGNOSIS — M25512 Pain in left shoulder: Secondary | ICD-10-CM | POA: Diagnosis not present

## 2017-03-21 DIAGNOSIS — M25512 Pain in left shoulder: Secondary | ICD-10-CM | POA: Diagnosis not present

## 2017-03-23 ENCOUNTER — Other Ambulatory Visit: Payer: Self-pay | Admitting: Allergy and Immunology

## 2017-03-23 NOTE — Telephone Encounter (Signed)
RF on Prilosec and Singulair 90 day supply with 1 refill

## 2017-03-24 DIAGNOSIS — M25512 Pain in left shoulder: Secondary | ICD-10-CM | POA: Diagnosis not present

## 2017-03-29 DIAGNOSIS — M25512 Pain in left shoulder: Secondary | ICD-10-CM | POA: Diagnosis not present

## 2017-03-31 DIAGNOSIS — M25512 Pain in left shoulder: Secondary | ICD-10-CM | POA: Diagnosis not present

## 2017-04-04 DIAGNOSIS — M25512 Pain in left shoulder: Secondary | ICD-10-CM | POA: Diagnosis not present

## 2017-04-08 DIAGNOSIS — M25512 Pain in left shoulder: Secondary | ICD-10-CM | POA: Diagnosis not present

## 2017-06-16 DIAGNOSIS — Z23 Encounter for immunization: Secondary | ICD-10-CM | POA: Diagnosis not present

## 2017-06-17 ENCOUNTER — Other Ambulatory Visit: Payer: Self-pay | Admitting: *Deleted

## 2017-06-17 MED ORDER — FLUTICASONE PROPIONATE HFA 44 MCG/ACT IN AERO: 2.0000 | INHALATION_SPRAY | Freq: Two times a day (BID) | RESPIRATORY_TRACT | 0 refills | Status: DC

## 2017-06-28 ENCOUNTER — Other Ambulatory Visit: Payer: Self-pay | Admitting: *Deleted

## 2017-06-28 MED ORDER — FLUTICASONE PROPIONATE 50 MCG/ACT NA SUSP: NASAL | 0 refills | Status: DC

## 2017-07-29 DIAGNOSIS — M7732 Calcaneal spur, left foot: Secondary | ICD-10-CM | POA: Diagnosis not present

## 2017-07-29 DIAGNOSIS — M71572 Other bursitis, not elsewhere classified, left ankle and foot: Secondary | ICD-10-CM | POA: Diagnosis not present

## 2017-07-29 DIAGNOSIS — M722 Plantar fascial fibromatosis: Secondary | ICD-10-CM | POA: Diagnosis not present

## 2017-08-05 DIAGNOSIS — M71572 Other bursitis, not elsewhere classified, left ankle and foot: Secondary | ICD-10-CM | POA: Diagnosis not present

## 2017-08-05 DIAGNOSIS — M722 Plantar fascial fibromatosis: Secondary | ICD-10-CM | POA: Diagnosis not present

## 2017-08-15 DIAGNOSIS — M722 Plantar fascial fibromatosis: Secondary | ICD-10-CM | POA: Diagnosis not present

## 2017-08-15 DIAGNOSIS — M71572 Other bursitis, not elsewhere classified, left ankle and foot: Secondary | ICD-10-CM | POA: Diagnosis not present

## 2017-09-06 ENCOUNTER — Encounter: Payer: Self-pay | Admitting: Allergy and Immunology

## 2017-09-06 ENCOUNTER — Ambulatory Visit: Payer: Medicare Other | Admitting: Allergy and Immunology

## 2017-09-06 ENCOUNTER — Ambulatory Visit (INDEPENDENT_AMBULATORY_CARE_PROVIDER_SITE_OTHER): Payer: Medicare Other | Admitting: Allergy and Immunology

## 2017-09-06 VITALS — BP 122/74 | HR 80 | Resp 16

## 2017-09-06 DIAGNOSIS — K219 Gastro-esophageal reflux disease without esophagitis: Secondary | ICD-10-CM

## 2017-09-06 DIAGNOSIS — J3089 Other allergic rhinitis: Secondary | ICD-10-CM

## 2017-09-06 DIAGNOSIS — J4540 Moderate persistent asthma, uncomplicated: Secondary | ICD-10-CM

## 2017-09-06 MED ORDER — BUDESONIDE-FORMOTEROL FUMARATE 160-4.5 MCG/ACT IN AERO: 2.0000 | INHALATION_SPRAY | Freq: Two times a day (BID) | RESPIRATORY_TRACT | 5 refills | Status: DC

## 2017-09-06 NOTE — Progress Notes (Signed)
Follow-up Note  Referring Provider: Nila NephewGreen, Edwin, MD Primary Provider: Nila NephewGreen, Edwin, MD Date of Office Visit: 09/06/2017  Subjective:   Beverly [name] (DOB: 03/30/1946) is a 72 y.o. female [facility] in re-evaluation of the following:  HPI: Beverly [name] returns to this clinic in reevaluation of her asthma and allergic rhinitis and LPR.  Her last visit to this clinic was 05 October 2016  Acting upon advice of a friend she started using a short acting bronchodilator every 5 hours to open up her lungs.  Indeed, she actually did feel as though the pressure on her chest resolved and she could take a deeper breath and overall felt better with her breathing.  Unfortunately, around the same point in time she also stopped her Flovent.  Overall she has had a pretty good year with her asthma and does not really have any significant problems with chest tightness or shortness of breath.  She still continues to have her long-standing throat clearing-like cough which is better using therapy for reflux but has never ever completely cleared with this therapy.  Her nose is doing well and she has not required a systemic steroid or antibiotic to treat any type of respiratory tract issue.  Allergies as of 09/06/2017   No Known Allergies     Medication List      Estradiol-Norethindrone Acet 0.5-0.1 MG tablet Take 1 tablet by mouth daily.   famotidine 20 MG tablet Commonly known as:  PEPCID Take 1 tablet (20 mg total) by mouth daily.   fluticasone 50 MCG/ACT nasal spray Commonly known as:  FLONASE instill 1 spray into each nostril once daily   GLUCOSAMINE-CHONDROITIN PO Take by mouth.   IMODIUM ADVANCED 2-125 MG Tabs Generic drug:  Loperamide-Simethicone Take 1 tablet by mouth daily as needed.   montelukast 10 MG tablet Commonly known as:  SINGULAIR TAKE 1 TABLET BY MOUTH ONCE DAILY   multivitamin tablet Take 1 tablet by mouth daily.   omeprazole  40 MG capsule Commonly known as:  PRILOSEC TAKE ONE CAPSULE BY MOUTH TWICE DAILY   SOLUBLE FIBER/PROBIOTICS PO Take by mouth.   traMADol 50 MG tablet Commonly known as:  ULTRAM take 1 tablet by mouth every 12 hours if needed       Past Medical History:  Diagnosis Date  . Allergic rhinitis   . Allergy   . Anemia   . Cataract    bilateral cataracts removed  . Chronic cough   . Esophageal reflux   . IBS (irritable bowel syndrome)   . Osteopenia 03/14/2013   DEXA @[facility]  02/2013: -1.8    Past Surgical History:  Procedure Laterality Date  . bilateral cateracts removed    . BREAST LUMPECTOMY     bilateral  . FOOT SURGERY     right  . LAPAROSCOPIC CHOLECYSTECTOMY  11/2009  . right foot bunionectomy    . TUBAL LIGATION      Review of systems negative except as noted in HPI / PMHx or noted below:  Review of Systems  Constitutional: Negative.   HENT: Negative.   Eyes: Negative.   Respiratory: Negative.   Cardiovascular: Negative.   Gastrointestinal: Negative.   Genitourinary: Negative.   Musculoskeletal: Negative.   Skin: Negative.   Neurological: Negative.   Endo/Heme/Allergies: Negative.   Psychiatric/Behavioral: Negative.      Objective:   Vitals:   09/06/17 1653  BP: 122/74  Pulse: 80  Resp: 16  Physical Exam  Constitutional: She is well-developed, well-nourished, and in no distress.  Throat clearing  HENT:  Head: Normocephalic.  Right Ear: Tympanic membrane, external ear and ear canal normal.  Left Ear: Tympanic membrane, external ear and ear canal normal.  Nose: Nose normal. No mucosal edema or rhinorrhea.  Mouth/Throat: Uvula is midline, oropharynx is clear and moist and mucous membranes are normal. No oropharyngeal exudate.  Eyes: Conjunctivae are normal.  Neck: Trachea normal. No tracheal tenderness present. No tracheal deviation present. No thyromegaly present.  Cardiovascular: Normal rate, regular rhythm, S1 normal, S2 normal and normal  heart sounds.  No murmur heard. Pulmonary/Chest: Breath sounds normal. No stridor. No respiratory distress. She has no wheezes. She has no rales.  Musculoskeletal: She exhibits no edema.  Lymphadenopathy:       Head (right side): No tonsillar adenopathy present.       Head (left side): No tonsillar adenopathy present.    She has no cervical adenopathy.  Neurological: She is alert. Gait normal.  Skin: No rash noted. She is not diaphoretic. No erythema. Nails show no clubbing.  Psychiatric: Mood and affect normal.    Diagnostics:    Spirometry was performed and demonstrated an FEV1 of 1.78 at 76 % of predicted.  The patient had an Asthma Control Test with the following results: ACT Total Score: 20.    Assessment and Plan:   1. Asthma, moderate persistent, well-controlled   2. Other allergic rhinitis   3. LPRD (laryngopharyngeal reflux disease)     1. Continue montelukast 10 mg one tablet one time per day  2. Start Symbicort 160 - 2 inhalations two times per day- 2 inhalations twice a day with spacer  3. Continue Flonase to one spray each nostril twice a day    4. Continue omeprazole 40 mg twice a day and famotidine 20 mg in the evening  5. Continue Zyrtec and ProAir if needed  6. Return to clinic in 6 months or earlier if problem  I am going to have [name] discontinue her short acting bronchodilator every 5 hours and place her on a long-acting bronchodilator in the form of Symbicort.  I have informed her today that using a short acting bronchodilator every 5 hours may give rise to a more twitchy long and a problem down the road and it would be best to rely on the use of a long-acting bronchodilator twice a day to replace her Sharolyn Douglas use.  She will continue to aggressively treat her reflux as noted above and continue on her other anti-inflammatory agents for and we will see how things go over the course of the next several months.  She will keep in contact with me noting her response  to this approach.  Laurette Schimke, MD Allergy / Immunology [facility]

## 2017-09-06 NOTE — Patient Instructions (Signed)
  1. Continue montelukast 10 mg one tablet one time per day  2. Start Symbicort 160 - 2 inhalations two times per day- 2 inhalations twice a day with spacer  3. Continue Flonase to one spray each nostril twice a day    4. Continue omeprazole 40 mg twice a day and famotidine 20 mg in the evening  5. Continue Zyrtec and ProAir if needed  6. Return to clinic in 6 months or earlier if problem

## 2017-09-07 ENCOUNTER — Encounter: Payer: Self-pay | Admitting: Allergy and Immunology

## 2017-09-22 ENCOUNTER — Telehealth: Payer: Self-pay | Admitting: Allergy and Immunology

## 2017-09-22 MED ORDER — BUDESONIDE-FORMOTEROL FUMARATE 160-4.5 MCG/ACT IN AERO: 2.0000 | INHALATION_SPRAY | Freq: Two times a day (BID) | RESPIRATORY_TRACT | 5 refills | Status: DC

## 2017-09-22 NOTE — Telephone Encounter (Signed)
Please inform patient that she can use Dulera 200 or Advair 115 as a replacement for her Symbicort, which ever item is cheaper.

## 2017-09-22 NOTE — Telephone Encounter (Signed)
Script sent to [facility]

## 2017-09-22 NOTE — Telephone Encounter (Signed)
LM for pt to call back.

## 2017-09-22 NOTE — Telephone Encounter (Signed)
The symbicort is expensive Is there a substitute the patient can have called in - so she doesn't have to pay so much out of pocket??

## 2017-09-22 NOTE — Telephone Encounter (Signed)
Spoke with pt, her husband is an insance agent and will help her get the Symbicort approved for a lower price, right now it's 310.00 out of pocket. She was wondering if she could go back on Qvar in the meantime. I also told her to check with us next week to see if we get any samples in. Please advise

## 2017-09-22 NOTE — Telephone Encounter (Signed)
Patient needs a script for symbicort [facility]

## 2017-09-23 ENCOUNTER — Other Ambulatory Visit: Payer: Self-pay

## 2017-09-23 ENCOUNTER — Telehealth: Payer: Self-pay | Admitting: Allergy and Immunology

## 2017-09-23 MED ORDER — BUDESONIDE-FORMOTEROL FUMARATE 160-4.5 MCG/ACT IN AERO: 2.0000 | INHALATION_SPRAY | Freq: Two times a day (BID) | RESPIRATORY_TRACT | 5 refills | Status: DC

## 2017-09-23 NOTE — Telephone Encounter (Addendum)
Pt called and wants to have  symbicort  [facility] [phone].

## 2017-09-23 NOTE — Telephone Encounter (Signed)
Called and spoke with patient.  She checked with her insurance and she still has to pay another $300 at this point to meet her deductible.  Patient has requested to stay on Symbicort at this time since it works well.  Patient will continue to use [facility] at this point, but is checking around with other pharmacies and prices too.

## 2017-09-23 NOTE — Telephone Encounter (Signed)
Symbicort called into [facility] and patient notified that we received a sample of Symbicort 160 mcg in the office today and she can pick up at her convenience.

## 2017-11-02 ENCOUNTER — Other Ambulatory Visit: Payer: Self-pay | Admitting: *Deleted

## 2017-11-02 MED ORDER — MONTELUKAST SODIUM 10 MG PO TABS: ORAL_TABLET | ORAL | 1 refills | Status: DC

## 2017-11-10 DIAGNOSIS — H26492 Other secondary cataract, left eye: Secondary | ICD-10-CM | POA: Diagnosis not present

## 2017-11-10 DIAGNOSIS — H31093 Other chorioretinal scars, bilateral: Secondary | ICD-10-CM | POA: Diagnosis not present

## 2017-11-10 DIAGNOSIS — H35373 Puckering of macula, bilateral: Secondary | ICD-10-CM | POA: Diagnosis not present

## 2017-11-10 DIAGNOSIS — Z961 Presence of intraocular lens: Secondary | ICD-10-CM | POA: Diagnosis not present

## 2017-12-20 ENCOUNTER — Other Ambulatory Visit: Payer: Self-pay | Admitting: Allergy and Immunology

## 2018-02-07 ENCOUNTER — Other Ambulatory Visit: Payer: Self-pay | Admitting: Allergy and Immunology

## 2018-02-16 ENCOUNTER — Other Ambulatory Visit: Payer: Self-pay | Admitting: Allergy and Immunology

## 2018-02-16 NOTE — Telephone Encounter (Signed)
[facility] refill  

## 2018-03-08 ENCOUNTER — Other Ambulatory Visit: Payer: Self-pay | Admitting: Allergy and Immunology

## 2018-03-09 DIAGNOSIS — Z01419 Encounter for gynecological examination (general) (routine) without abnormal findings: Secondary | ICD-10-CM | POA: Diagnosis not present

## 2018-03-09 DIAGNOSIS — Z1231 Encounter for screening mammogram for malignant neoplasm of breast: Secondary | ICD-10-CM | POA: Diagnosis not present

## 2018-04-04 ENCOUNTER — Ambulatory Visit (INDEPENDENT_AMBULATORY_CARE_PROVIDER_SITE_OTHER): Payer: Medicare Other | Admitting: Allergy and Immunology

## 2018-04-04 ENCOUNTER — Encounter: Payer: Self-pay | Admitting: Allergy and Immunology

## 2018-04-04 VITALS — BP 112/78 | HR 68 | Resp 16

## 2018-04-04 DIAGNOSIS — K219 Gastro-esophageal reflux disease without esophagitis: Secondary | ICD-10-CM

## 2018-04-04 DIAGNOSIS — J3089 Other allergic rhinitis: Secondary | ICD-10-CM

## 2018-04-04 DIAGNOSIS — J4540 Moderate persistent asthma, uncomplicated: Secondary | ICD-10-CM | POA: Diagnosis not present

## 2018-04-04 MED ORDER — MONTELUKAST SODIUM 10 MG PO TABS
ORAL_TABLET | ORAL | 1 refills | Status: DC
Start: 2018-04-04 — End: 2018-06-21

## 2018-04-04 MED ORDER — FLUTICASONE PROPIONATE 50 MCG/ACT NA SUSP: NASAL | 5 refills | Status: DC

## 2018-04-04 MED ORDER — BUDESONIDE-FORMOTEROL FUMARATE 160-4.5 MCG/ACT IN AERO: INHALATION_SPRAY | RESPIRATORY_TRACT | 1 refills | Status: DC

## 2018-04-04 NOTE — Patient Instructions (Signed)
  1. Continue montelukast 10 mg one tablet one time per day  2. Continue Symbicort 160 - 2 inhalations 1-2 times per day depending on asthma activity  3. Continue Flonase to one spray each nostril 1-2 times per day depending on upper airway activity  4. Continue omeprazole 40 mg twice a day and famotidine 20 mg in the evening  5. Continue Zyrtec and ProAir if needed  6. Return to clinic in 6 months or earlier if problem  7.  Obtain full flu vaccine

## 2018-04-04 NOTE — Progress Notes (Signed)
Follow-up Note  Referring Provider: Nila Nephew, MD Primary Provider: Nila Nephew, MD Date of Office Visit: 04/04/2018  Subjective:   Beverly [name] (DOB: 10-08-1945) is a 72 y.o. female who [facility] in re-evaluation of the following:  HPI: Beverly [name] returns to this clinic in reevaluation of her asthma and allergic rhinitis and LPR.  I last saw her in this clinic on 06 September 2017.  While utilizing a combination of anti-inflammatory agents for both her upper and lower airway she has really done very well.  She has not required a systemic steroid or antibiotic to treat any type of respiratory tract issue.  She can exercise without any difficulty and rarely uses a short acting bronchodilator.  She has been consistently using montelukast and a nasal steroid and Symbicort.  Her reflux is under very good control.  She still has a sudden onset spasmodic cough that goes on for a few seconds occasionally.  She has not been having any throat clearing or irritation of her throat or classic reflux symptoms while consistently using therapy.  Allergies as of 04/04/2018   No Known Allergies     Medication List      budesonide-formoterol 160-4.5 MCG/ACT inhaler Commonly known as:  SYMBICORT INHALE 2 PUFFS INTO THE LUNGS TWICE A DAY   Estradiol-Norethindrone Acet 0.5-0.1 MG tablet Take 1 tablet by mouth daily.   famotidine 20 MG tablet Commonly known as:  PEPCID Take 1 tablet (20 mg total) by mouth daily.   fluticasone 50 MCG/ACT nasal spray Commonly known as:  FLONASE USE 1 SPRAY IN EACH NOSTRIL daily   GLUCOSAMINE-CHONDROITIN PO Take by mouth.   IMODIUM ADVANCED 2-125 MG Tabs Generic drug:  Loperamide-Simethicone Take 1 tablet by mouth daily as needed.   montelukast 10 MG tablet Commonly known as:  SINGULAIR TAKE 1 TABLET BY MOUTH EVERY DAY AS DIRECTED   multivitamin tablet Take 1 tablet by mouth daily.   omeprazole 40 MG  capsule Commonly known as:  PRILOSEC TAKE ONE CAPSULE BY MOUTH TWICE DAILY   SOLUBLE FIBER/PROBIOTICS PO Take by mouth.   traMADol 50 MG tablet Commonly known as:  ULTRAM take 1 tablet by mouth every 12 hours if needed       Past Medical History:  Diagnosis Date  . Allergic rhinitis   . Allergy   . Anemia   . Cataract    bilateral cataracts removed  . Chronic cough   . Esophageal reflux   . IBS (irritable bowel syndrome)   . Osteopenia 03/14/2013   DEXA @[facility]  02/2013: -1.8    Past Surgical History:  Procedure Laterality Date  . bilateral cateracts removed    . BREAST LUMPECTOMY     bilateral  . FOOT SURGERY     right  . LAPAROSCOPIC CHOLECYSTECTOMY  11/2009  . right foot bunionectomy    . TUBAL LIGATION      Review of systems negative except as noted in HPI / PMHx or noted below:  Review of Systems  Constitutional: Negative.   HENT: Negative.   Eyes: Negative.   Respiratory: Negative.   Cardiovascular: Negative.   Gastrointestinal: Negative.   Genitourinary: Negative.   Musculoskeletal: Negative.   Skin: Negative.   Neurological: Negative.   Endo/Heme/Allergies: Negative.   Psychiatric/Behavioral: Negative.      Objective:   Vitals:   04/04/18 1701  BP: 112/78  Pulse: 68  Resp: 16  Physical Exam  HENT:  Head: Normocephalic.  Right Ear: Tympanic membrane, external ear and ear canal normal.  Left Ear: Tympanic membrane, external ear and ear canal normal.  Nose: Nose normal. No mucosal edema or rhinorrhea.  Mouth/Throat: Uvula is midline, oropharynx is clear and moist and mucous membranes are normal. No oropharyngeal exudate.  Eyes: Conjunctivae are normal.  Neck: Trachea normal. No tracheal tenderness present. No tracheal deviation present. No thyromegaly present.  Cardiovascular: Normal rate, regular rhythm, S1 normal, S2 normal and normal heart sounds.  No murmur heard. Pulmonary/Chest: Breath sounds normal. No stridor. No respiratory  distress. She has no wheezes. She has no rales.  Musculoskeletal: She exhibits no edema.  Lymphadenopathy:       Head (right side): No tonsillar adenopathy present.       Head (left side): No tonsillar adenopathy present.    She has no cervical adenopathy.  Neurological: She is alert.  Skin: No rash noted. She is not diaphoretic. No erythema. Nails show no clubbing.    Diagnostics:    Spirometry was performed and demonstrated an FEV1 of 1.74 at 76 % of predicted.  The patient had an Asthma Control Test with the following results: ACT Total Score: 21.    Assessment and Plan:   1. Asthma, moderate persistent, well-controlled   2. Other allergic rhinitis   3. LPRD (laryngopharyngeal reflux disease)     1. Continue montelukast 10 mg one tablet one time per day  2. Continue Symbicort 160 - 2 inhalations 1-2 times per day depending on asthma activity  3. Continue Flonase to one spray each nostril 1-2 times per day depending on upper airway activity  4. Continue omeprazole 40 mg twice a day and famotidine 20 mg in the evening  5. Continue Zyrtec and ProAir if needed  6. Return to clinic in 6 months or earlier if problem  7.  Obtain full flu vaccine  Beverly [name] appears to have really done very well on her current anti-inflammatory plan for her airway inflammation and therapy directed against reflux.  She now has the option of using Symbicort 1 or 2 times per day depending on disease activity and Flonase 1 or 2 times per day depending on disease activity.  I will see her back in this clinic in 6 months or earlier if there is a problem.  Laurette SchimkeEric Ewel Lona, MD Allergy / Immunology [facility]

## 2018-04-05 ENCOUNTER — Encounter: Payer: Self-pay | Admitting: Allergy and Immunology

## 2018-04-13 ENCOUNTER — Telehealth: Payer: Self-pay

## 2018-04-13 NOTE — Telephone Encounter (Signed)
Patient called to inform Dr. Lucie Leather about an article in the [place] [employer] Times on 04/10/2018 about asthma patients and bronchodilators. She was reading it and she knows she does the breathing test (spiro) and wanted to inform him of it and she also asked about her inhalers which one was a long acting bronchodilator and which one was a short acting. I informed patient about her inhalers and she stated unentertaining and was thankful I spoke with her about them.

## 2018-05-05 DIAGNOSIS — Z23 Encounter for immunization: Secondary | ICD-10-CM | POA: Diagnosis not present

## 2018-05-18 ENCOUNTER — Other Ambulatory Visit: Payer: Self-pay | Admitting: Allergy and Immunology

## 2018-05-20 ENCOUNTER — Other Ambulatory Visit: Payer: Self-pay | Admitting: Allergy and Immunology

## 2018-06-21 ENCOUNTER — Telehealth: Payer: Self-pay

## 2018-06-21 MED ORDER — BUDESONIDE-FORMOTEROL FUMARATE 160-4.5 MCG/ACT IN AERO: INHALATION_SPRAY | RESPIRATORY_TRACT | 0 refills | Status: DC

## 2018-06-21 MED ORDER — MONTELUKAST SODIUM 10 MG PO TABS: ORAL_TABLET | ORAL | 0 refills | Status: DC

## 2018-06-21 MED ORDER — FLUTICASONE PROPIONATE 50 MCG/ACT NA SUSP: NASAL | 5 refills | Status: DC

## 2018-06-21 NOTE — Telephone Encounter (Signed)
Patient called and stated that [facility] did not have her prescriptions on file from her last OV 03/2018. Prescriptions were send to [facility] and she stated she never has had anything sent there. Prescriptions have been sent to [facility].

## 2018-09-16 ENCOUNTER — Other Ambulatory Visit: Payer: Self-pay | Admitting: Allergy and Immunology

## 2018-09-19 ENCOUNTER — Ambulatory Visit: Payer: Medicare Other | Admitting: Allergy and Immunology

## 2018-09-28 ENCOUNTER — Other Ambulatory Visit: Payer: Self-pay | Admitting: Allergy and Immunology

## 2018-09-30 ENCOUNTER — Other Ambulatory Visit: Payer: Self-pay | Admitting: Allergy and Immunology

## 2018-10-02 NOTE — Telephone Encounter (Signed)
[facility] refill  

## 2018-10-03 ENCOUNTER — Ambulatory Visit: Payer: Medicare Other | Admitting: Allergy and Immunology

## 2018-11-28 ENCOUNTER — Ambulatory Visit (INDEPENDENT_AMBULATORY_CARE_PROVIDER_SITE_OTHER): Payer: Medicare Other | Admitting: Allergy and Immunology

## 2018-11-28 ENCOUNTER — Other Ambulatory Visit: Payer: Self-pay

## 2018-11-28 ENCOUNTER — Encounter: Payer: Self-pay | Admitting: Allergy and Immunology

## 2018-11-28 VITALS — BP 126/86 | HR 66

## 2018-11-28 DIAGNOSIS — K219 Gastro-esophageal reflux disease without esophagitis: Secondary | ICD-10-CM | POA: Diagnosis not present

## 2018-11-28 DIAGNOSIS — J4540 Moderate persistent asthma, uncomplicated: Secondary | ICD-10-CM | POA: Diagnosis not present

## 2018-11-28 DIAGNOSIS — J3089 Other allergic rhinitis: Secondary | ICD-10-CM | POA: Diagnosis not present

## 2018-11-28 MED ORDER — FLUTICASONE PROPIONATE 50 MCG/ACT NA SUSP: NASAL | 5 refills | Status: DC

## 2018-11-28 MED ORDER — FAMOTIDINE 20 MG PO TABS: 20.0000 mg | ORAL_TABLET | Freq: Every day | ORAL | 5 refills | Status: DC

## 2018-11-28 MED ORDER — MONTELUKAST SODIUM 10 MG PO TABS: 10.0000 mg | ORAL_TABLET | Freq: Every day | ORAL | 1 refills | Status: DC

## 2018-11-28 MED ORDER — OMEPRAZOLE 40 MG PO CPDR: 40.0000 mg | DELAYED_RELEASE_CAPSULE | Freq: Two times a day (BID) | ORAL | 1 refills | Status: DC

## 2018-11-28 MED ORDER — BUDESONIDE-FORMOTEROL FUMARATE 160-4.5 MCG/ACT IN AERO: INHALATION_SPRAY | RESPIRATORY_TRACT | 0 refills | Status: DC

## 2018-11-28 NOTE — Patient Instructions (Addendum)
  1. Continue montelukast 10 mg one tablet one time per day  2. Continue Symbicort 160 - 2 inhalations 2 times per day    3. Continue Flonase to one spray each nostril 1-2 times per day depending on upper airway activity  4. Continue omeprazole 40 mg twice a day and famotidine 20 mg in the evening  5. Continue Zyrtec and ProAir if needed    6. Return to clinic in 6 months or earlier if problem

## 2018-11-28 NOTE — Progress Notes (Signed)
[place]   Follow-up Note  Referring Provider: Nila NephewGreen, Edwin, MD Primary Provider: Nila NephewGreen, Edwin, MD Date of Office Visit: 11/28/2018  Subjective:   Ky BarbanNancy N [name] (DOB: 05/26/1946) is a 73 y.o. female who [facility] 11/28/2018 in re-evaluation of the following:  HPI: This is a E - Med visit requested by patient who is located at home.  [name] is followed in this clinic for a combination of asthma and allergic rhinitis and reflux/LPR.  She was last seen in this clinic on 04 April 2018.  Doing OK with asthma. Has not required a systemic steroid. Rare use of SABA. Using Symbicort twice a day  Nose doing OK. Has not required an antibiotic. Using Flonase and montelukast  Reflux under control with PPI and H2RB. Still with caffeine triggers and meat triggers.  Being very careful about triggers.  Has decreased alcohol consumption.  Currently weighs 118 pounds.  However still with intermittent coughing when reclining after meals.   Self isolated secondary to coronavirus pandemic.  Allergies as of 11/28/2018   No Known Allergies     Medication List      budesonide-formoterol 160-4.5 MCG/ACT inhaler Commonly known as:  Symbicort INHALE 2 PUFFS INTO THE LUNGS TWICE A DAY   Estradiol-Norethindrone Acet 0.5-0.1 MG tablet Take 1 tablet by mouth daily.   famotidine 20 MG tablet Commonly known as:  Pepcid Take 1 tablet (20 mg total) by mouth daily.   fluticasone 50 MCG/ACT nasal spray Commonly known as:  FLONASE USE 1 SPRAY IN EACH NOSTRIL daily   GLUCOSAMINE-CHONDROITIN PO Take by mouth.   Imodium Advanced 2-125 MG Tabs Generic drug:  Loperamide-Simethicone Take 1 tablet by mouth daily as needed.   montelukast 10 MG tablet Commonly known as:  SINGULAIR TAKE 1 TABLET BY MOUTH EVERY DAY AS DIRECTED   multivitamin tablet Take 1 tablet by mouth daily.   omeprazole 40 MG capsule Commonly  known as:  PRILOSEC TAKE ONE CAPSULE TWICE A DAY   SOLUBLE FIBER/PROBIOTICS PO Take by mouth.   traMADol 50 MG tablet Commonly known as:  ULTRAM take 1 tablet by mouth every 12 hours if needed       Past Medical History:  Diagnosis Date  . Allergic rhinitis   . Allergy   . Anemia   . Cataract    bilateral cataracts removed  . Chronic cough   . Esophageal reflux   . IBS (irritable bowel syndrome)   . Osteopenia 03/14/2013   DEXA @[facility]  02/2013: -1.8    Past Surgical History:  Procedure Laterality Date  . bilateral cateracts removed    . BREAST LUMPECTOMY     bilateral  . FOOT SURGERY     right  . LAPAROSCOPIC CHOLECYSTECTOMY  11/2009  . right foot bunionectomy    . TUBAL LIGATION      Review of systems negative except as noted in HPI / PMHx or noted below:  Review of Systems  Constitutional: Negative.   HENT: Negative.   Eyes: Negative.   Respiratory: Negative.   Cardiovascular: Negative.   Gastrointestinal: Negative.   Genitourinary: Negative.   Musculoskeletal: Negative.   Skin: Negative.   Neurological: Negative.   Endo/Heme/Allergies: Negative.   Psychiatric/Behavioral: Negative.      Objective:   Vitals:   11/28/18 1428  BP: 126/86  Pulse: 66          Physical Exam-deferred  Diagnostics: none  Assessment  and Plan:   1. Asthma, moderate persistent, well-controlled   2. Other allergic rhinitis   3. LPRD (laryngopharyngeal reflux disease)     1. Continue montelukast 10 mg one tablet one time per day  2. Continue Symbicort 160 - 2 inhalations 2 times per day    3. Continue Flonase to one spray each nostril 1-2 times per day depending on upper airway activity  4. Continue omeprazole 40 mg twice a day and famotidine 20 mg in the evening  5. Continue Zyrtec and ProAir if needed    6. Return to clinic in 6 months or earlier if problem  Overall [name] appears to be doing relatively well with her respiratory tract disease which is a  combination of inflammation and reflux induced respiratory disease.  Her reflux is very significant and it is probably responsible for some of the lingering sporadic asthmatic and intermittent coughing bouts that she develops especially after eating a meal and assuming a reclining position.  She will remain on the plan noted above to address these issues and I will see her back in this clinic in 6 months or earlier if there is a problem.  Total patient interaction time 20 minutes  Laurette Schimke, MD Allergy / Immunology [facility]

## 2018-11-28 NOTE — Progress Notes (Signed)
Start 225 Location at home  End  248:  Patient decided to take her own vitals at home and wanted it documented.

## 2018-11-29 ENCOUNTER — Encounter: Payer: Self-pay | Admitting: Allergy and Immunology

## 2018-12-25 ENCOUNTER — Other Ambulatory Visit: Payer: Self-pay | Admitting: Allergy and Immunology

## 2019-05-24 ENCOUNTER — Other Ambulatory Visit: Payer: Self-pay | Admitting: Allergy and Immunology

## 2019-05-29 ENCOUNTER — Ambulatory Visit (INDEPENDENT_AMBULATORY_CARE_PROVIDER_SITE_OTHER): Payer: Medicare Other | Admitting: Allergy and Immunology

## 2019-05-29 ENCOUNTER — Encounter: Payer: Self-pay | Admitting: Allergy and Immunology

## 2019-05-29 ENCOUNTER — Other Ambulatory Visit: Payer: Self-pay

## 2019-05-29 VITALS — BP 126/74 | HR 65 | Temp 97.6°F | Resp 18

## 2019-05-29 DIAGNOSIS — J4540 Moderate persistent asthma, uncomplicated: Secondary | ICD-10-CM

## 2019-05-29 DIAGNOSIS — K219 Gastro-esophageal reflux disease without esophagitis: Secondary | ICD-10-CM

## 2019-05-29 DIAGNOSIS — J3089 Other allergic rhinitis: Secondary | ICD-10-CM

## 2019-05-29 MED ORDER — OMEPRAZOLE 40 MG PO CPDR: 40.0000 mg | DELAYED_RELEASE_CAPSULE | Freq: Two times a day (BID) | ORAL | 1 refills | Status: DC

## 2019-05-29 MED ORDER — FLUTICASONE PROPIONATE 50 MCG/ACT NA SUSP: NASAL | 1 refills | Status: DC

## 2019-05-29 MED ORDER — FAMOTIDINE 20 MG PO TABS: 20.0000 mg | ORAL_TABLET | Freq: Every evening | ORAL | 1 refills | Status: DC

## 2019-05-29 MED ORDER — MONTELUKAST SODIUM 10 MG PO TABS: 10.0000 mg | ORAL_TABLET | Freq: Every day | ORAL | 1 refills | Status: DC

## 2019-05-29 MED ORDER — MOMETASONE FURO-FORMOTEROL FUM 200-5 MCG/ACT IN AERO: 2.0000 | INHALATION_SPRAY | Freq: Two times a day (BID) | RESPIRATORY_TRACT | 1 refills | Status: DC

## 2019-05-29 NOTE — Patient Instructions (Addendum)
  1. Continue montelukast 10 mg one tablet one time per day  2. Start Dulera 200 HFA to replace Symbicort - 2 inhalations 2 times per day   3. Continue Flonase to one spray each nostril 1-2 times per day depending on upper airway activity  4. Continue omeprazole 40 mg twice a day and famotidine 20 mg in the evening  5. Continue Zyrtec and ProAir if needed    6. Return to clinic in 6 months or earlier if problem  7.  Obtain Covid vaccine when available

## 2019-05-29 NOTE — Progress Notes (Signed)
[place] - [place] - [place] - [place] - [place]   Follow-up Note  Referring Provider: Nila Nephew, MD Primary Provider: Nila Nephew, MD Date of Office Visit: 05/29/2019  Subjective:   Beverly [name] (DOB: 11-30-1945) is a 73 y.o. female who [facility] in re-evaluation of the following:  HPI: Beverly [name] returns to this clinic in reevaluation of asthma and allergic rhinitis and LPR.  Her last contact with this clinic was via a E - med visit on 28 Nov 2018.  Overall Beverly [name] has been doing relatively well.  Relatively well for Beverly [name] means that she has about 90% of her cough under control on her current plan of anti-inflammatory agents for her airway and therapy directed against reflux.  She still wakes up in the morning and has an irritated throat and cough and when she uses her Symbicort she goes into a coughing jag that can last a minute or so for each dose.  Interestingly, this Symbicort induced coughing does not appear to occur at nighttime.  There is also a history of coughing when she gets around irritants such as strong smells or smokes.  She does not use a short acting bronchodilator.  Overall her nose is doing very well.  She does not think that she has been having any issues with reflux.  She has a half caffeinated drink about 3 times a week and she can definitely tell that she gets some effects from using this caffeinated drink.  As well, she drinks 1 glass of wine per night.  She did receive the flu vaccine this year.  Allergies as of 05/29/2019   No Known Allergies     Medication List      budesonide-formoterol 160-4.5 MCG/ACT inhaler Commonly known as: Symbicort INHALE 2 PUFFS INTO THE LUNGS TWICE A DAY   Estradiol-Norethindrone Acet 0.5-0.1 MG tablet Take 1 tablet by mouth daily.   famotidine 20 MG tablet Commonly known as: Pepcid Take 1 tablet (20 mg total) by mouth daily.   fluticasone 50 MCG/ACT nasal  spray Commonly known as: FLONASE SPRAY 1 SPRAY INTO EACH NOSTRIL EVERY DAY   GLUCOSAMINE-CHONDROITIN PO Take by mouth.   Imodium Advanced 2-125 MG Tabs Generic drug: Loperamide-Simethicone Take 1 tablet by mouth daily as needed.   montelukast 10 MG tablet Commonly known as: SINGULAIR Take 1 tablet (10 mg total) by mouth daily.   multivitamin tablet Take 1 tablet by mouth daily.   omeprazole 40 MG capsule Commonly known as: PRILOSEC TAKE 1 CAPSULE BY MOUTH TWICE A DAY   SOLUBLE FIBER/PROBIOTICS PO Take by mouth.   traMADol 50 MG tablet Commonly known as: ULTRAM take 1 tablet by mouth every 12 hours if needed       Past Medical History:  Diagnosis Date  . Allergic rhinitis   . Allergy   . Anemia   . Cataract    bilateral cataracts removed  . Chronic cough   . Esophageal reflux   . IBS (irritable bowel syndrome)   . Osteopenia 03/14/2013   DEXA @[facility]  02/2013: -1.8    Past Surgical History:  Procedure Laterality Date  . bilateral cateracts removed    . BREAST LUMPECTOMY     bilateral  . FOOT SURGERY     right  . LAPAROSCOPIC CHOLECYSTECTOMY  11/2009  . right foot bunionectomy    . TUBAL LIGATION      Review of systems negative except as noted in HPI / PMHx or  noted below:  Review of Systems  Constitutional: Negative.   HENT: Negative.   Eyes: Negative.   Respiratory: Negative.   Cardiovascular: Negative.   Gastrointestinal: Negative.   Genitourinary: Negative.   Musculoskeletal: Negative.   Skin: Negative.   Neurological: Negative.   Endo/Heme/Allergies: Negative.   Psychiatric/Behavioral: Negative.      Objective:   Vitals:   05/29/19 1612  BP: 126/74  Pulse: 65  Resp: 18  Temp: 97.6 F (36.4 C)  SpO2: 98%          Physical Exam Constitutional:      Appearance: She is not diaphoretic.  HENT:     Head: Normocephalic.     Right Ear: Tympanic membrane, ear canal and external ear normal.     Left Ear: Tympanic membrane, ear canal and  external ear normal.     Nose: Nose normal. No mucosal edema or rhinorrhea.     Mouth/Throat:     Pharynx: Uvula midline. No oropharyngeal exudate.  Eyes:     Conjunctiva/sclera: Conjunctivae normal.  Neck:     Thyroid: No thyromegaly.     Trachea: Trachea normal. No tracheal tenderness or tracheal deviation.  Cardiovascular:     Rate and Rhythm: Normal rate and regular rhythm.     Heart sounds: Normal heart sounds, S1 normal and S2 normal. No murmur.  Pulmonary:     Effort: No respiratory distress.     Breath sounds: Normal breath sounds. No stridor. No wheezing or rales.  Lymphadenopathy:     Head:     Right side of head: No tonsillar adenopathy.     Left side of head: No tonsillar adenopathy.     Cervical: No cervical adenopathy.  Skin:    Findings: No erythema or rash.     Nails: There is no clubbing.   Neurological:     Mental Status: She is alert.     Diagnostics:    Spirometry was performed and demonstrated an FEV1 of 1.73 at 76 % of predicted.  The patient had an Asthma Control Test with the following results: ACT Total Score: 25.    Assessment and Plan:   1. Asthma, moderate persistent, well-controlled   2. Other allergic rhinitis   3. LPRD (laryngopharyngeal reflux disease)      1. Continue montelukast 10 mg one tablet one time per day  2. Start Dulera 200 HFA to replace Symbicort - 2 inhalations 2 times per day    3. Continue Flonase to one spray each nostril 1-2 times per day depending on upper airway activity  4. Continue omeprazole 40 mg twice a day and famotidine 20 mg in the evening  5. Continue Zyrtec and ProAir if needed    6. Return to clinic in 6 months or earlier if problem  7.  Obtain Covid vaccine when available  Beverly [name] is doing okay on her current plan of action which includes anti-inflammatory agents for both her upper and lower airway and pretty aggressive therapy directed against reflux.  She does appear to have a little bit of an  irritant effect when using Symbicort and will try her on Dulera.  She has been using a spacer for Symbicort and she will switch that over to [facility].  She will contact me noting her response to this change.  Assuming she does well I will see her back in his clinic in 6 months or earlier if there is a problem.  Allena Katz, MD Allergy / Immunology [facility]

## 2019-05-30 ENCOUNTER — Encounter: Payer: Self-pay | Admitting: Allergy and Immunology

## 2019-06-05 ENCOUNTER — Other Ambulatory Visit: Payer: Self-pay | Admitting: *Deleted

## 2019-06-05 ENCOUNTER — Telehealth: Payer: Self-pay

## 2019-06-05 MED ORDER — AIRDUO DIGIHALER 113-14 MCG/ACT IN AEPB: 2.0000 | INHALATION_SPRAY | Freq: Two times a day (BID) | RESPIRATORY_TRACT | 5 refills | Status: DC

## 2019-06-05 NOTE — Telephone Encounter (Signed)
PA for [facility] was initiated through [email or web address] on 06/04/2019. Pa was denied due to a trial and fail of multiple medication that included Breo, Airduo (fluticasone-salmeterol) and Pulmicort Flexhaler. Please advise on alternative. Patient has tried and failed Symbicort.

## 2019-06-05 NOTE — Telephone Encounter (Signed)
Please inform patient that the insurance company has denied her the use of Dulera.  They want [place] to try air duo.  Please provide her with the medium dose of air duo-2 inhalations twice a day.  If she has difficulty with this inhaler please have her contact us so we can hopefully get approval for [facility].

## 2019-06-05 NOTE — Telephone Encounter (Signed)
Airduo sent to the pharmacy and patient notified of medication sent. Will probably need to initiate a PA.

## 2019-06-05 NOTE — Addendum Note (Signed)
Addended by: Valere Dross on: 06/05/2019 11:36 AM   Modules accepted: Orders

## 2019-06-06 MED ORDER — BREO ELLIPTA 200-25 MCG/INH IN AEPB: 1.0000 | INHALATION_SPRAY | Freq: Every day | RESPIRATORY_TRACT | 5 refills | Status: DC

## 2019-06-06 NOTE — Telephone Encounter (Signed)
Breo 200 

## 2019-06-06 NOTE — Telephone Encounter (Signed)
Rx sent to pharmacy   

## 2019-06-06 NOTE — Telephone Encounter (Signed)
According to [facility] AirDuo is not covered even though insurance stated it was the only preferred medication are Symbicort or Breo. Would you like to send Breo instead?

## 2019-06-06 NOTE — Addendum Note (Signed)
Addended by: Valere Dross on: 06/06/2019 11:25 AM   Modules accepted: Orders

## 2019-07-31 ENCOUNTER — Ambulatory Visit: Payer: Medicare Other | Attending: Internal Medicine

## 2019-07-31 DIAGNOSIS — Z23 Encounter for immunization: Secondary | ICD-10-CM | POA: Insufficient documentation

## 2019-07-31 NOTE — Progress Notes (Signed)
   Covid-19 Vaccination Clinic  Name:  Beverly [name]    MRN: [id number] DOB: 1945/09/08  07/31/2019  Beverly [name] was observed post Covid-19 immunization for 15 minutes without incidence. She was provided with Vaccine Information Sheet and instruction to access the V-Safe system.   Beverly [name] was instructed to call 911 with any severe reactions post vaccine: Marland Kitchen Difficulty breathing  . Swelling of your face and throat  . A fast heartbeat  . A bad rash all over your body  . Dizziness and weakness    Immunizations Administered    Name Date Dose VIS Date Route   Pfizer COVID-19 Vaccine 07/31/2019  5:55 PM 0.3 mL 06/22/2019 Intramuscular   Manufacturer: [employer], [employer]   Lot: [id number]   NDC: [address]-0

## 2019-08-19 ENCOUNTER — Ambulatory Visit: Payer: Medicare Other | Attending: Internal Medicine

## 2019-08-19 DIAGNOSIS — Z23 Encounter for immunization: Secondary | ICD-10-CM

## 2019-08-19 NOTE — Progress Notes (Signed)
   Covid-19 Vaccination Clinic  Name:  Beverly [name]    MRN: [id number] DOB: 12/05/1945  08/19/2019  [name] was observed post Covid-19 immunization for 15 minutes without incidence. She was provided with Vaccine Information Sheet and instruction to access the V-Safe system.   [name] was instructed to call 911 with any severe reactions post vaccine: Marland Kitchen Difficulty breathing  . Swelling of your face and throat  . A fast heartbeat  . A bad rash all over your body  . Dizziness and weakness    Immunizations Administered    Name Date Dose VIS Date Route   Pfizer COVID-19 Vaccine 08/19/2019 10:36 AM 0.3 mL 06/22/2019 Intramuscular   Manufacturer: [employer], [employer]   Lot: [id number]   NDC: [id number]

## 2019-11-27 ENCOUNTER — Ambulatory Visit: Payer: Medicare Other | Admitting: Allergy and Immunology

## 2019-12-11 ENCOUNTER — Encounter: Payer: Self-pay | Admitting: Allergy and Immunology

## 2019-12-11 ENCOUNTER — Other Ambulatory Visit: Payer: Self-pay

## 2019-12-11 ENCOUNTER — Ambulatory Visit (INDEPENDENT_AMBULATORY_CARE_PROVIDER_SITE_OTHER): Payer: Medicare Other | Admitting: Allergy and Immunology

## 2019-12-11 VITALS — BP 110/72 | HR 70 | Temp 98.3°F | Resp 16 | Ht 64.0 in | Wt 118.8 lb

## 2019-12-11 DIAGNOSIS — K219 Gastro-esophageal reflux disease without esophagitis: Secondary | ICD-10-CM

## 2019-12-11 DIAGNOSIS — J4540 Moderate persistent asthma, uncomplicated: Secondary | ICD-10-CM

## 2019-12-11 DIAGNOSIS — J3089 Other allergic rhinitis: Secondary | ICD-10-CM

## 2019-12-11 NOTE — Patient Instructions (Addendum)
  1. Continue montelukast 10 mg one tablet one time per day  2. Continue Breo 200 - 1 inhalation 1 time per day    3. Continue Flonase to one spray each nostril 1-2 times per day    4. Continue omeprazole 40 mg twice a day and famotidine 20 mg in the evening  5. Continue Zyrtec and ProAir if needed    6. Return to clinic in 6 months or earlier if problem

## 2019-12-11 NOTE — Progress Notes (Signed)
[place] - [place] - [place]   Follow-up Note  Referring Provider: Levin Erp, MD Primary Provider: Levin Erp, MD Date of Office Visit: 12/11/2019  Subjective:   Beverly [name] (DOB: 1946/01/14) is a 74 y.o. female who [facility] to [facility] on 12/11/2019 in re-evaluation of the following:  HPI: Beverly [name] returns to this clinic in reevaluation of multifactorial form of cough with contribution from asthma, allergic rhinitis, and LPR.  Her last visit to this clinic was 29 May 2019.  Overall she has done well with pretty good control regarding her coughing issues.  She still occasionally has some throat clearing and some coughing but this does not appear to interfere with her ability to perform activities or sleep through the night.  She still about 90% controlled regarding her cough on her current plan of anti-inflammatory agents for her airway and therapy directed against reflux.  She has not required a systemic steroid or an antibiotic for any type of airway issue and rarely uses a short acting bronchodilator.  She has not been having any issues with reflux.  She has been having some issues with her neck.  She can feel popping in her neck when she turns her neck and she has been having a lot of stiffness and pain and sometimes headache that radiates up from her neck.  She has had a few massage sessions and this does not appear to have helped this issue.  She did receive 2 Pfizer Covid vaccinations.  Allergies as of 12/11/2019   No Known Allergies     Medication List      Breo Ellipta 200-25 MCG/INH Aepb Generic drug: fluticasone furoate-vilanterol Inhale 1 puff into the lungs daily.   Estradiol-Norethindrone Acet 0.5-0.1 MG tablet Take 1 tablet by mouth daily.   famotidine 20 MG tablet Commonly known as: Pepcid Take 1 tablet (20 mg total) by mouth every evening.   fluticasone 50 MCG/ACT nasal spray Commonly known as:  FLONASE 1 spray each nostril 1-2 times per day depending on upper airway activity   GLUCOSAMINE-CHONDROITIN PO Take by mouth.   Imodium Advanced 2-125 MG Tabs Generic drug: Loperamide-Simethicone Take 1 tablet by mouth daily as needed.   montelukast 10 MG tablet Commonly known as: SINGULAIR Take 1 tablet (10 mg total) by mouth daily.   multivitamin tablet Take 1 tablet by mouth daily.   omeprazole 40 MG capsule Commonly known as: PRILOSEC Take 1 capsule (40 mg total) by mouth 2 (two) times daily.   SOLUBLE FIBER/PROBIOTICS PO Take by mouth.   traMADol 50 MG tablet Commonly known as: ULTRAM take 1 tablet by mouth every 12 hours if needed       Past Medical History:  Diagnosis Date  . Allergic rhinitis   . Allergy   . Anemia   . Asthma   . Cataract    bilateral cataracts removed  . Chronic cough   . Esophageal reflux   . IBS (irritable bowel syndrome)   . Osteopenia 03/14/2013   DEXA @[facility]  02/2013: -1.8    Past Surgical History:  Procedure Laterality Date  . bilateral cateracts removed    . BREAST LUMPECTOMY     bilateral  . FOOT SURGERY     right  . LAPAROSCOPIC CHOLECYSTECTOMY  11/2009  . right foot bunionectomy    . TUBAL LIGATION      Review of systems negative except as noted in HPI / PMHx or noted below:  Review  of Systems  Constitutional: Negative.   HENT: Negative.   Eyes: Negative.   Respiratory: Negative.   Cardiovascular: Negative.   Gastrointestinal: Negative.   Genitourinary: Negative.   Musculoskeletal: Negative.   Skin: Negative.   Neurological: Negative.   Endo/Heme/Allergies: Negative.   Psychiatric/Behavioral: Negative.      Objective:   Vitals:   12/11/19 1610  BP: 110/72  Pulse: 70  Resp: 16  Temp: 98.3 F (36.8 C)  SpO2: 98%   Height: 5\' 4"  (162.6 cm)  Weight: 118 lb 12.8 oz (53.9 kg)   Physical Exam Constitutional:      Appearance: She is not diaphoretic.  HENT:     Head: Normocephalic.     Right Ear:  Tympanic membrane, ear canal and external ear normal.     Left Ear: Tympanic membrane, ear canal and external ear normal.     Nose: Nose normal. No mucosal edema or rhinorrhea.     Mouth/Throat:     Pharynx: Uvula midline. No oropharyngeal exudate.  Eyes:     Conjunctiva/sclera: Conjunctivae normal.  Neck:     Thyroid: No thyromegaly.     Trachea: Trachea normal. No tracheal tenderness or tracheal deviation.  Cardiovascular:     Rate and Rhythm: Normal rate and regular rhythm.     Heart sounds: Normal heart sounds, S1 normal and S2 normal. No murmur.  Pulmonary:     Effort: No respiratory distress.     Breath sounds: Normal breath sounds. No stridor. No wheezing or rales.  Lymphadenopathy:     Head:     Right side of head: No tonsillar adenopathy.     Left side of head: No tonsillar adenopathy.     Cervical: No cervical adenopathy.  Skin:    Findings: No erythema or rash.     Nails: There is no clubbing.  Neurological:     Mental Status: She is alert.     Diagnostics:    Spirometry was performed and demonstrated an FEV1 of 1.69 at 77 % of predicted.  The patient had an Asthma Control Test with the following results: ACT Total Score: 25.    Assessment and Plan:   1. Asthma, moderate persistent, well-controlled   2. Other allergic rhinitis   3. LPRD (laryngopharyngeal reflux disease)     1. Continue montelukast 10 mg one tablet one time per day  2. Continue Breo 200 - 1 inhalation 1 time per day    3. Continue Flonase to one spray each nostril 1-2 times per day    4. Continue omeprazole 40 mg twice a day and famotidine 20 mg in the evening  5. Continue Zyrtec and ProAir if needed    6. Return to clinic in 6 months or earlier if problem  Beverly [name] appears to be doing relatively well from a airway issue on her current therapy and she will continue on a collection of anti-inflammatory agents for her airway and therapy directed against reflux.  Assuming she does well with  this plan I will see her back in this clinic in 6 months or earlier if there is a problem.  She appears to be having a lot of neck issues whether this be secondary to arthritis or something more significant and I made the suggestion that she consider visiting with a chiropractor and if that is nonproductive then visiting with either an orthopedic surgeon or someone in sports medicine or possibly a rheumatologist to address the issue with her neck.  [name], MD Allergy /  Immunology [facility]

## 2019-12-12 ENCOUNTER — Encounter: Payer: Self-pay | Admitting: Allergy and Immunology

## 2019-12-19 ENCOUNTER — Other Ambulatory Visit: Payer: Self-pay | Admitting: Allergy and Immunology

## 2019-12-19 ENCOUNTER — Other Ambulatory Visit: Payer: Self-pay

## 2019-12-19 ENCOUNTER — Ambulatory Visit (INDEPENDENT_AMBULATORY_CARE_PROVIDER_SITE_OTHER): Payer: Medicare Other

## 2019-12-19 ENCOUNTER — Ambulatory Visit (INDEPENDENT_AMBULATORY_CARE_PROVIDER_SITE_OTHER): Payer: Medicare Other | Admitting: Family Medicine

## 2019-12-19 ENCOUNTER — Encounter: Payer: Self-pay | Admitting: Family Medicine

## 2019-12-19 VITALS — BP 110/78 | HR 63 | Ht 64.0 in | Wt 118.0 lb

## 2019-12-19 DIAGNOSIS — M542 Cervicalgia: Secondary | ICD-10-CM

## 2019-12-19 DIAGNOSIS — M5030 Other cervical disc degeneration, unspecified cervical region: Secondary | ICD-10-CM | POA: Diagnosis not present

## 2019-12-19 MED ORDER — TIZANIDINE HCL 2 MG PO CAPS: 2.0000 mg | ORAL_CAPSULE | Freq: Every day | ORAL | 0 refills | Status: DC

## 2019-12-19 NOTE — Patient Instructions (Addendum)
Zanaflex 2mg  at night PT [facility] Xray  today Iron 65 w 500 Vit C Vit D 2000IU See me in 4 weeks if not better will consider OMT and labs

## 2019-12-19 NOTE — Assessment & Plan Note (Signed)
Assuming significant arthritic changes.  Patient is mildly underweight as well.  We discussed the importance of strengthening of the upper back.  Patient given a low-dose of muscle relaxer.  X-rays pending at this time.  Discussed icing regimen and home exercises.  Discussed which activities to doing which wants to avoid.  Patient will increase activity slowly over the course the next several weeks.  Follow-up again in 4 to 8 weeks otherwise

## 2019-12-19 NOTE — Progress Notes (Signed)
[place] Phone: [phone] Subjective:   Beverly Beverly [name], am serving as a scribe for Dr. Hulan Saas. This visit occurred during the SARS-CoV-2 public health emergency.  Safety protocols were in place, including screening questions prior to the visit, additional usage of staff PPE, and extensive cleaning of exam room while observing appropriate contact time as indicated for disinfecting solutions.   I'm seeing this patient by the request  of:  Beverly Erp, MD  CC: Neck pain   [id number]  Beverly Beverly [name] is a 74 y.o. female coming in with complaint of back and neck pain. OMT 2016. Patient states her pain began 3 months ago, right sided. Has had 2-90 minute massages which did not help. Denies any radiating pain.  Has had a few headaches from the pain. Has limited ROM in extension.  This is starting to affect daily activities.  Difficult to even increase activity and such things as driving has become very difficult and feels unsafe.    Reviewed prior external information including notes and imaging from previsou exam, outside providers and external EMR if available.   As well as notes that were available from care everywhere and other healthcare systems.  Past medical history, social, surgical and family history all reviewed in electronic medical record.  Beverly [name] pertanent information unless stated regarding to the chief complaint.   Past Medical History:  Diagnosis Date  . Allergic rhinitis   . Allergy   . Anemia   . Asthma   . Cataract    bilateral cataracts removed  . Chronic cough   . Esophageal reflux   . IBS (irritable bowel syndrome)   . Osteopenia 03/14/2013   DEXA @[facility]  02/2013: -1.8    Beverly [name] Known Allergies   Review of Systems:  Beverly [name] headache, visual changes, nausea, vomiting, diarrhea, constipation, dizziness, abdominal pain, skin rash, fevers, chills, night sweats, weight loss, swollen lymph nodes,  body aches, joint swelling, chest pain, shortness of breath, mood changes. POSITIVE muscle aches  Objective  Blood pressure 110/78, pulse 63, height 5\' 4"  (1.626 m), weight 118 lb (53.5 kg), SpO2 97 %.   General: Beverly [name] apparent distress alert and oriented x3 mood and affect normal, dressed appropriately.  HEENT: Pupils equal, extraocular movements intact  Respiratory: Patient's speak in full sentences and does not appear short of breath  Cardiovascular: Beverly [name] lower extremity edema, non tender, Beverly [name] erythema  Neuro: Cranial nerves II through XII are intact, neurovascularly intact in all extremities with 2+ DTRs and 2+ pulses.  Gait normal with good balance and coordination.  MSK:  Non tender with full range of motion and good stability and symmetric strength and tone of shoulders, elbows, wrist, hip, knee and ankles bilaterally.  Neck exam shows the patient does have some mild loss lumbar lordosis.  Patient does have some atrophy of the shoulder girdle bilaterally.  Patient does have low muscle mass overall.  Patient does have tender to palpation diffusely on the neck.  Negative Spurling's but does have decreasing range of motion.  [id number]; 15 additional minutes spent for Therapeutic exercises as stated in above notes.  This included exercises focusing on stretching, strengthening, with significant focus on eccentric aspects.   Long term goals include an improvement in range of motion, strength, endurance as well as avoiding reinjury. Patient's frequency would include in 1-2 times a day, 3-5 times a week for a duration of 6-12 weeks.  Exercises that included:  Basic scapular  stabilization to include adduction and depression of scapula Scaption, focusing on proper movement and good control Internal and External rotation utilizing a theraband, with elbow tucked at side entire time Rows with theraband  Proper technique shown and discussed handout in great detail with ATC.  All questions were discussed and  answered.     The above documentation has been reviewed and is accurate and complete [name], DO       Note: This dictation was prepared with Dragon dictation along with smaller phrase technology. Any transcriptional errors that result from this process are unintentional.

## 2019-12-21 ENCOUNTER — Ambulatory Visit: Payer: BLUE CROSS/BLUE SHIELD | Admitting: Family Medicine

## 2019-12-23 ENCOUNTER — Other Ambulatory Visit: Payer: Self-pay | Admitting: Allergy and Immunology

## 2020-01-10 ENCOUNTER — Other Ambulatory Visit: Payer: Self-pay | Admitting: Allergy and Immunology

## 2020-01-11 ENCOUNTER — Other Ambulatory Visit: Payer: Self-pay | Admitting: Family Medicine

## 2020-01-29 ENCOUNTER — Ambulatory Visit: Payer: BLUE CROSS/BLUE SHIELD | Admitting: Family Medicine

## 2020-02-05 ENCOUNTER — Other Ambulatory Visit: Payer: Self-pay | Admitting: Family Medicine

## 2020-02-05 DIAGNOSIS — Z0000 Encounter for general adult medical examination without abnormal findings: Secondary | ICD-10-CM | POA: Insufficient documentation

## 2020-02-07 ENCOUNTER — Ambulatory Visit (INDEPENDENT_AMBULATORY_CARE_PROVIDER_SITE_OTHER): Payer: Medicare Other | Admitting: Family Medicine

## 2020-02-07 ENCOUNTER — Encounter: Payer: Self-pay | Admitting: Family Medicine

## 2020-02-07 ENCOUNTER — Other Ambulatory Visit: Payer: Self-pay

## 2020-02-07 DIAGNOSIS — M5030 Other cervical disc degeneration, unspecified cervical region: Secondary | ICD-10-CM

## 2020-02-07 NOTE — Patient Instructions (Signed)
Good to see you [email or web address] for food sensitivity test  Stay active See me again in 8-10 weeks we will consider manipulation

## 2020-02-07 NOTE — Assessment & Plan Note (Signed)
Degenerative disc disease.  Seems to be stable at this moment and is making improvement.  I would like patient to continue with the home exercises icing regimen, Zanaflex for breakthrough.  Follow-up again in 2 to 3 months and at that time if doing better we will consider osteopathic manipulation

## 2020-02-07 NOTE — Progress Notes (Signed)
[name] [address] [place] [address] Phone: [phone] Subjective:   I Beverly [name] am serving as a Neurosurgeon for Dr. Antoine Primas.  This visit occurred during the SARS-CoV-2 public health emergency.  Safety protocols were in place, including screening questions prior to the visit, additional usage of staff PPE, and extensive cleaning of exam room while observing appropriate contact time as indicated for disinfecting solutions.   I'm seeing this patient by the request  of:  Nila Nephew, MD  CC: Neck pain follow-up  YOK:[id number]   12/19/2019 Assuming significant arthritic changes.  Patient is mildly underweight as well.  We discussed the importance of strengthening of the upper back.  Patient given a low-dose of muscle relaxer.  X-rays pending at this time.  Discussed icing regimen and home exercises.  Discussed which activities to doing which wants to avoid.  Patient will increase activity slowly over the course the next several weeks.  Follow-up again in 4 to 8 weeks otherwise  Update 02/07/2020 Beverly [name] is a 74 y.o. female coming in with complaint of neck pain. Patient states she is doing a little better but the pain is still better. Making some progress. Headache pain has gone away. Stiffness in the neck is still present. States the muscles have loosened.  Patient continues to have degenerative disc disease.  States that it is doing much better overall.  Would state that she is about 75 to 75% better    Past Medical History:  Diagnosis Date  . Allergic rhinitis   . Allergy   . Anemia   . Asthma   . Cataract    bilateral cataracts removed  . Chronic cough   . Esophageal reflux   . IBS (irritable bowel syndrome)   . Osteopenia 03/14/2013   DEXA @[facility]  02/2013: -1.8   Past Surgical History:  Procedure Laterality Date  . bilateral cateracts removed    . BREAST LUMPECTOMY     bilateral  . FOOT SURGERY     right  . LAPAROSCOPIC  CHOLECYSTECTOMY  11/2009  . right foot bunionectomy    . TUBAL LIGATION     Social History   Socioeconomic History  . Marital status: Married    Spouse name: Not on file  . Number of children: Not on file  . Years of education: Not on file  . Highest education level: Not on file  Occupational History  . Not on file  Tobacco Use  . Smoking status: Never Smoker  . Smokeless tobacco: Never Used  Vaping Use  . Vaping Use: Never used  Substance and Sexual Activity  . Alcohol use: Yes    Alcohol/week: 7.0 standard drinks    Types: 7 Glasses of wine per week  . Drug use: No  . Sexual activity: Not on file  Other Topics Concern  . Not on file  Social History Narrative  . Not on file   Social Determinants of Health   Financial Resource Strain:   . Difficulty of Paying Living Expenses:   Food Insecurity:   . Worried About 12/2009 in the Last Year:   . Programme researcher, broadcasting/film/video in the Last Year:   Transportation Needs:   . Barista (Medical):   Freight forwarder Lack of Transportation (Non-Medical):   Physical Activity:   . Days of Exercise per Week:   . Minutes of Exercise per Session:   Stress:   . Feeling of Stress :  Social Connections:   . Frequency of Communication with Friends and Family:   . Frequency of Social Gatherings with Friends and Family:   . Attends Religious Services:   . Active Member of Clubs or Organizations:   . Attends Banker Meetings:   Marland Kitchen Marital Status:    No Known Allergies Family History  Problem Relation Age of Onset  . Pancreatic cancer Mother   . Heart disease Father   . Hypertension Brother   . Hyperlipidemia Brother   . Colon cancer Maternal Grandfather 53  . Esophageal cancer Neg Hx   . Rectal cancer Neg Hx   . Stomach cancer Neg Hx   . Allergic rhinitis Neg Hx   . Angioedema Neg Hx   . Asthma Neg Hx   . Atopy Neg Hx   . Eczema Neg Hx   . Urticaria Neg Hx   . Immunodeficiency Neg Hx     Current Outpatient  Medications (Endocrine & Metabolic):  .  Estradiol-Norethindrone Acet 0.5-0.1 MG tablet, Take 1 tablet by mouth daily.   Current Outpatient Medications (Respiratory):  Marland Kitchen  BREO ELLIPTA 200-25 MCG/INH AEPB, TAKE 1 PUFF BY MOUTH EVERY DAY .  fluticasone (FLONASE) 50 MCG/ACT nasal spray, USE 1 SPRAY IN EACH NOSTRIL 1 TO 2 TIMES A DAY DEPENDING ON UPPER AIRWAY .  montelukast (SINGULAIR) 10 MG tablet, TAKE 1 TABLET BY MOUTH EVERY DAY  Current Outpatient Medications (Analgesics):  .  traMADol (ULTRAM) 50 MG tablet, take 1 tablet by mouth every 12 hours if needed   Current Outpatient Medications (Other):  .  famotidine (PEPCID) 20 MG tablet, Take 1 tablet (20 mg total) by mouth every evening. Marland Kitchen  GLUCOSAMINE-CHONDROITIN PO, Take by mouth. .  Loperamide-Simethicone (IMODIUM ADVANCED) 2-125 MG TABS, Take 1 tablet by mouth daily as needed.   .  Multiple Vitamin (MULTIVITAMIN) tablet, Take 1 tablet by mouth daily.   Marland Kitchen  omeprazole (PRILOSEC) 40 MG capsule, Take 1 capsule (40 mg total) by mouth 2 (two) times daily. .  Probiotic Product (SOLUBLE FIBER/PROBIOTICS PO), Take by mouth. .  tizanidine (ZANAFLEX) 2 MG capsule, TAKE 1 CAPSULE BY MOUTH EVERY DAY AT BEDTIME   Reviewed prior external information including notes and imaging from  primary care provider As well as notes that were available from care everywhere and other healthcare systems.  Past medical history, social, surgical and family history all reviewed in electronic medical record.  No pertanent information unless stated regarding to the chief complaint.   Review of Systems:  No headache, visual changes, nausea, vomiting, diarrhea, constipation, dizziness, abdominal pain, skin rash, fevers, chills, night sweats, weight loss, swollen lymph nodes, body aches, joint swelling, chest pain, shortness of breath, mood changes. POSITIVE muscle aches  Objective  Blood pressure 100/78, pulse 68, height 5\' 4"  (1.626 m), weight 123 lb (55.8 kg), SpO2  97 %.   General: No apparent distress alert and oriented x3 mood and affect normal, dressed appropriately.  HEENT: Pupils equal, extraocular movements intact  Respiratory: Patient's speak in full sentences and does not appear short of breath  Cardiovascular: No lower extremity edema, non tender, no erythema  Neuro: Cranial nerves II through XII are intact, neurovascularly intact in all extremities with 2+ DTRs and 2+ pulses.  Gait normal with good balance and coordination.  MSK: Mild arthritic changes of multiple joints Neck exam does have some mild loss of lordosis.  Some tenderness to palpation.  Patient does have some mild crepitus.  Mild tightness in the  right parascapular region    Impression and Recommendations:     The above documentation has been reviewed and is accurate and complete [name], DO       Note: This dictation was prepared with Dragon dictation along with smaller phrase technology. Any transcriptional errors that result from this process are unintentional.

## 2020-02-11 ENCOUNTER — Telehealth: Payer: Self-pay | Admitting: Family Medicine

## 2020-02-11 DIAGNOSIS — D649 Anemia, unspecified: Secondary | ICD-10-CM

## 2020-02-11 DIAGNOSIS — J45909 Unspecified asthma, uncomplicated: Secondary | ICD-10-CM | POA: Insufficient documentation

## 2020-02-11 HISTORY — DX: Anemia, unspecified: D64.9

## 2020-02-11 NOTE — Telephone Encounter (Signed)
Left message for patient voicemail and sent patient MyChart message.

## 2020-02-11 NOTE — Telephone Encounter (Signed)
Patient is having a hard time doing the exeresis that were given with the red band. She asked if there is another band she can use with less resistance.

## 2020-02-11 NOTE — Telephone Encounter (Signed)
Patient called back and is wanting to get the yellow band. Have cut one off and placed up front for patient to pick up

## 2020-02-12 ENCOUNTER — Other Ambulatory Visit: Payer: Self-pay | Admitting: Family Medicine

## 2020-02-12 DIAGNOSIS — Z13820 Encounter for screening for osteoporosis: Secondary | ICD-10-CM

## 2020-04-02 ENCOUNTER — Other Ambulatory Visit: Payer: Self-pay | Admitting: Obstetrics & Gynecology

## 2020-04-02 ENCOUNTER — Ambulatory Visit: Payer: BLUE CROSS/BLUE SHIELD | Admitting: Family Medicine

## 2020-04-02 DIAGNOSIS — R928 Other abnormal and inconclusive findings on diagnostic imaging of breast: Secondary | ICD-10-CM

## 2020-04-17 DIAGNOSIS — R928 Other abnormal and inconclusive findings on diagnostic imaging of breast: Secondary | ICD-10-CM | POA: Insufficient documentation

## 2020-04-21 ENCOUNTER — Other Ambulatory Visit: Payer: Self-pay

## 2020-04-21 ENCOUNTER — Ambulatory Visit
Admission: RE | Admit: 2020-04-21 | Discharge: 2020-04-21 | Disposition: A | Discharge status: Home / Self Care | Payer: Medicare Other | Source: Ambulatory Visit | Attending: Obstetrics & Gynecology | Admitting: Obstetrics & Gynecology

## 2020-04-21 ENCOUNTER — Ambulatory Visit: Payer: BLUE CROSS/BLUE SHIELD

## 2020-04-21 DIAGNOSIS — R928 Other abnormal and inconclusive findings on diagnostic imaging of breast: Secondary | ICD-10-CM

## 2020-05-21 ENCOUNTER — Other Ambulatory Visit: Payer: Self-pay

## 2020-05-21 ENCOUNTER — Ambulatory Visit
Admission: RE | Admit: 2020-05-21 | Discharge: 2020-05-21 | Disposition: A | Discharge status: Home / Self Care | Payer: Medicare Other | Source: Ambulatory Visit | Attending: Family Medicine | Admitting: Family Medicine

## 2020-05-21 DIAGNOSIS — Z13820 Encounter for screening for osteoporosis: Secondary | ICD-10-CM

## 2020-07-23 ENCOUNTER — Other Ambulatory Visit: Payer: Self-pay | Admitting: Allergy and Immunology

## 2020-07-23 NOTE — Telephone Encounter (Signed)
Called and spoke to patient and husband and informed them that we would send in a courtesy refill and that we needed to set up a follow up visit due to it being over 6 months since her last visit. Patient and husband agreed. Husband expressed that since patient has started at our practice her cough has gotten worse. Husband explained that he is frustrated that she has not had any relief with the current treatment plan and would rather the provider explain that he or she can't help rather than take money and prescribe expensive medication and not have any cure. Husband explained that the Virgel Bouquet helps very minor but his wife coughs until she is about to vomit and he is concerned about her health and coughing spells at this point and wants to see that she is healed and not taken advantage of. Patient made an appointment for Feb. 1 and husband would like to accompany her at this appointment to discuss a treatment plan.

## 2020-08-12 ENCOUNTER — Ambulatory Visit (INDEPENDENT_AMBULATORY_CARE_PROVIDER_SITE_OTHER): Payer: Medicare Other | Admitting: Allergy and Immunology

## 2020-08-12 ENCOUNTER — Other Ambulatory Visit: Payer: Self-pay

## 2020-08-12 VITALS — BP 110/70 | HR 67 | Temp 97.6°F | Resp 16 | Ht 63.5 in | Wt 129.6 lb

## 2020-08-12 DIAGNOSIS — J4540 Moderate persistent asthma, uncomplicated: Secondary | ICD-10-CM | POA: Diagnosis not present

## 2020-08-12 DIAGNOSIS — J3089 Other allergic rhinitis: Secondary | ICD-10-CM | POA: Diagnosis not present

## 2020-08-12 DIAGNOSIS — K219 Gastro-esophageal reflux disease without esophagitis: Secondary | ICD-10-CM

## 2020-08-12 MED ORDER — FAMOTIDINE 20 MG PO TABS
20.0000 mg | ORAL_TABLET | Freq: Every evening | ORAL | 1 refills | Status: DC
Start: 2020-08-12 — End: 2020-08-13

## 2020-08-12 MED ORDER — PANTOPRAZOLE SODIUM 40 MG PO TBEC: 40.0000 mg | DELAYED_RELEASE_TABLET | Freq: Every day | ORAL | 5 refills | Status: DC

## 2020-08-12 MED ORDER — FLUTICASONE PROPIONATE 50 MCG/ACT NA SUSP: NASAL | 1 refills | Status: DC

## 2020-08-12 MED ORDER — ALBUTEROL SULFATE HFA 108 (90 BASE) MCG/ACT IN AERS: 2.0000 | INHALATION_SPRAY | RESPIRATORY_TRACT | 3 refills | Status: DC | PRN

## 2020-08-12 MED ORDER — CETIRIZINE HCL 10 MG PO TABS: 10.0000 mg | ORAL_TABLET | Freq: Every day | ORAL | 5 refills | Status: DC

## 2020-08-12 MED ORDER — BREO ELLIPTA 200-25 MCG/INH IN AEPB: INHALATION_SPRAY | RESPIRATORY_TRACT | 5 refills | Status: DC

## 2020-08-12 NOTE — Patient Instructions (Addendum)
  1. Continue Breo 200 - 1 inhalation 1 time per day    2. Continue Flonase - 1 spray each nostril 1-2 times per day    3. Continue Pantoprazole 40 mg 1 time a day + famotidine 20 mg in the evening if needed  4. Continue Zyrtec and ProAir if needed    5. Return to clinic in 6 months or earlier if problem

## 2020-08-12 NOTE — Progress Notes (Signed)
[place] - [place] - [place] - [place] - [place]   Follow-up Note  Referring Provider: Nila Nephew, MD Primary Provider: Nila Nephew, MD Date of Office Visit: 08/12/2020  Subjective:   Beverly [name] (DOB: Sep 22, 1945) is a 75 y.o. female who [facility] in re-evaluation of the following:  HPI: Beverly [name] returns to this clinic in evaluation of asthma and allergic rhinitis and LPR.  Her last visit to this clinic was 11 December 2019.  Overall she feels as though she has her cough under pretty good control.  She still occasionally has some throat clearing and she is making a concerted effort replace that throat clearing maneuver with swallowing.  Rarely does she use a short acting bronchodilator.  She has no limitation on ability to exercise.  Her coughing never keeps her up at nighttime.  When she goes from a warm room to a cold environment she sometimes will cough but she has found that when she uses a mask it helps this issue.  Her reflux has been under very good control.  She is using a proton pump inhibitor and rarely a H2 receptor blocker.  When I last saw her in this clinic she was having some issues with her neck and she has undergone dry needling with a physical therapist which has resulted in significant improvement regarding her neck.  She has received 3 Pfizer COVID vaccines and the flu vaccine.  Allergies as of 08/12/2020   No Known Allergies     Medication List      Breo Ellipta 200-25 MCG/INH Aepb Generic drug: fluticasone furoate-vilanterol INHALE 1 PUFF BY MOUTH EVERY DAY   Estradiol-Norethindrone Acet 0.5-0.1 MG tablet Take 1 tablet by mouth daily.   famotidine 20 MG tablet Commonly known as: Pepcid Take 1 tablet (20 mg total) by mouth every evening.   fluticasone 50 MCG/ACT nasal spray Commonly known as: FLONASE USE 1 SPRAY IN EACH NOSTRIL 1 TO 2 TIMES A DAY DEPENDING ON UPPER AIRWAY   GLUCOSAMINE-CHONDROITIN  PO Take by mouth.   Loperamide-Simethicone 2-125 MG Tabs Take 1 tablet by mouth daily as needed.   montelukast 10 MG tablet Commonly known as: SINGULAIR TAKE 1 TABLET BY MOUTH EVERY DAY   multivitamin tablet Take 1 tablet by mouth daily.   omeprazole 40 MG capsule Commonly known as: PRILOSEC Take 1 capsule (40 mg total) by mouth 2 (two) times daily.   SOLUBLE FIBER/PROBIOTICS PO Take by mouth.   tizanidine 2 MG capsule Commonly known as: ZANAFLEX TAKE 1 CAPSULE BY MOUTH EVERY DAY AT BEDTIME   traMADol 50 MG tablet Commonly known as: ULTRAM take 1 tablet by mouth every 12 hours if needed       Past Medical History:  Diagnosis Date   Allergic rhinitis    Allergy    Anemia    Asthma    Cataract    bilateral cataracts removed   Chronic cough    Esophageal reflux    IBS (irritable bowel syndrome)    Osteopenia 03/14/2013   DEXA @[facility]  02/2013: -1.8    Past Surgical History:  Procedure Laterality Date   bilateral cateracts removed     BREAST LUMPECTOMY Left    FOOT SURGERY     right   LAPAROSCOPIC CHOLECYSTECTOMY  11/2009   right foot bunionectomy     TUBAL LIGATION      Review of systems negative except as noted in HPI / PMHx or noted below:  Review of Systems  Constitutional: Negative.   HENT: Negative.   Eyes: Negative.   Respiratory: Negative.   Cardiovascular: Negative.   Gastrointestinal: Negative.   Genitourinary: Negative.   Musculoskeletal: Negative.   Skin: Negative.   Neurological: Negative.   Endo/Heme/Allergies: Negative.   Psychiatric/Behavioral: Negative.      Objective:   Vitals:   08/12/20 1637  BP: 110/70  Pulse: 67  Resp: 16  Temp: 97.6 F (36.4 C)  SpO2: 95%   Height: 5' 3.5" (161.3 cm)  Weight: 129 lb 9.6 oz (58.8 kg)   Physical Exam Constitutional:      Appearance: She is not diaphoretic.  HENT:     Head: Normocephalic.     Right Ear: Tympanic membrane, ear canal and external ear normal.     Left  Ear: Tympanic membrane, ear canal and external ear normal.     Nose: Nose normal. No mucosal edema or rhinorrhea.     Mouth/Throat:     Mouth: Oropharynx is clear and moist and mucous membranes are normal.     Pharynx: Uvula midline. No oropharyngeal exudate.  Eyes:     Conjunctiva/sclera: Conjunctivae normal.  Neck:     Thyroid: No thyromegaly.     Trachea: Trachea normal. No tracheal tenderness or tracheal deviation.  Cardiovascular:     Rate and Rhythm: Normal rate and regular rhythm.     Heart sounds: Normal heart sounds, S1 normal and S2 normal. No murmur heard.   Pulmonary:     Effort: No respiratory distress.     Breath sounds: Normal breath sounds. No stridor. No wheezing or rales.  Musculoskeletal:        General: No edema.  Lymphadenopathy:     Head:     Right side of head: No tonsillar adenopathy.     Left side of head: No tonsillar adenopathy.     Cervical: No cervical adenopathy.  Skin:    Findings: No erythema or rash.     Nails: There is no clubbing.  Neurological:     Mental Status: She is alert.     Diagnostics:    Spirometry was performed and demonstrated an FEV1 of 1.18 at 58 % of predicted.  Assessment and Plan:   1. Asthma, moderate persistent, well-controlled   2. Other allergic rhinitis   3. LPRD (laryngopharyngeal reflux disease)     1. Continue Breo 200 - 1 inhalation 1 time per day    2. Continue Flonase - 1 spray each nostril 1-2 times per day    3. Continue Pantoprazole 40 mg 1 time a day + famotidine 20 mg in the evening if needed  4. Continue Zyrtec and ProAir if needed    5. Return to clinic in 6 months or earlier if problem   [name] appears to be doing okay on her current plan of anti-inflammatory agents for her airway and therapy directed against reflux regarding all of her respiratory tract symptoms.  We will keep her on this plan and see her back in his clinic in 6 months or earlier if there is a problem.  Laurette Schimke,  MD Allergy / Immunology [facility]

## 2020-08-13 ENCOUNTER — Telehealth: Payer: Self-pay | Admitting: Allergy and Immunology

## 2020-08-13 ENCOUNTER — Encounter: Payer: Self-pay | Admitting: Allergy and Immunology

## 2020-08-13 MED ORDER — PANTOPRAZOLE SODIUM 40 MG PO TBEC: 40.0000 mg | DELAYED_RELEASE_TABLET | Freq: Every day | ORAL | 5 refills | Status: DC

## 2020-08-13 MED ORDER — BREO ELLIPTA 200-25 MCG/INH IN AEPB: INHALATION_SPRAY | RESPIRATORY_TRACT | 5 refills | Status: DC

## 2020-08-13 MED ORDER — FAMOTIDINE 20 MG PO TABS
20.0000 mg | ORAL_TABLET | Freq: Every evening | ORAL | 1 refills | Status: DC
Start: 2020-08-13 — End: 2021-02-10

## 2020-08-13 MED ORDER — CETIRIZINE HCL 10 MG PO TABS: 10.0000 mg | ORAL_TABLET | Freq: Every day | ORAL | 5 refills | Status: DC

## 2020-08-13 MED ORDER — FLUTICASONE PROPIONATE 50 MCG/ACT NA SUSP: NASAL | 1 refills | Status: DC

## 2020-08-13 MED ORDER — ALBUTEROL SULFATE HFA 108 (90 BASE) MCG/ACT IN AERS: 2.0000 | INHALATION_SPRAY | RESPIRATORY_TRACT | 3 refills | Status: DC | PRN

## 2020-08-13 NOTE — Telephone Encounter (Signed)
Patient was seen yesterday and her prescriptions were sent to the wrong pharmacy. She said [facility] [address] and [address] She needs them all send to this pharmacy.

## 2020-08-13 NOTE — Telephone Encounter (Signed)
Patient notified that Rx have been sent to correct pharmacy.

## 2020-08-13 NOTE — Addendum Note (Signed)
Addended by: Osa Craver on: 08/13/2020 04:55 PM   Modules accepted: Orders

## 2020-10-28 ENCOUNTER — Other Ambulatory Visit (HOSPITAL_BASED_OUTPATIENT_CLINIC_OR_DEPARTMENT_OTHER): Payer: Self-pay

## 2020-10-29 ENCOUNTER — Other Ambulatory Visit (HOSPITAL_BASED_OUTPATIENT_CLINIC_OR_DEPARTMENT_OTHER): Payer: Self-pay

## 2020-10-29 ENCOUNTER — Other Ambulatory Visit (HOSPITAL_BASED_OUTPATIENT_CLINIC_OR_DEPARTMENT_OTHER)
Admission: RE | Admit: 2020-10-29 | Discharge: 2020-10-29 | Disposition: A | Discharge status: Home / Self Care | Payer: Medicare Other | Source: Ambulatory Visit | Attending: Family Medicine | Admitting: Family Medicine

## 2020-10-29 ENCOUNTER — Other Ambulatory Visit (HOSPITAL_BASED_OUTPATIENT_CLINIC_OR_DEPARTMENT_OTHER): Payer: Self-pay | Admitting: Family Medicine

## 2020-10-29 ENCOUNTER — Ambulatory Visit (HOSPITAL_BASED_OUTPATIENT_CLINIC_OR_DEPARTMENT_OTHER)
Admission: RE | Admit: 2020-10-29 | Discharge: 2020-10-29 | Disposition: A | Discharge status: Home / Self Care | Payer: Medicare Other | Source: Ambulatory Visit | Attending: Family Medicine | Admitting: Family Medicine

## 2020-10-29 ENCOUNTER — Other Ambulatory Visit: Payer: Self-pay

## 2020-10-29 ENCOUNTER — Ambulatory Visit: Payer: Medicare Other | Attending: Internal Medicine

## 2020-10-29 DIAGNOSIS — R1013 Epigastric pain: Secondary | ICD-10-CM

## 2020-10-29 DIAGNOSIS — Z800 Family history of malignant neoplasm of digestive organs: Secondary | ICD-10-CM | POA: Insufficient documentation

## 2020-10-29 DIAGNOSIS — Z23 Encounter for immunization: Secondary | ICD-10-CM

## 2020-10-29 DIAGNOSIS — R109 Unspecified abdominal pain: Secondary | ICD-10-CM | POA: Insufficient documentation

## 2020-10-29 HISTORY — DX: Unspecified abdominal pain: R10.9

## 2020-10-29 LAB — CBC
HCT: 40.3 % (ref 36.0–46.0)
Hemoglobin: 13.1 g/dL (ref 12.0–15.0)
MCH: 31.2 pg (ref 26.0–34.0)
MCHC: 32.5 g/dL (ref 30.0–36.0)
MCV: 96 fL (ref 80.0–100.0)
Platelets: 280 10*3/uL (ref 150–400)
RBC: 4.2 MIL/uL (ref 3.87–5.11)
RDW: 12.8 % (ref 11.5–15.5)
WBC: 5.8 10*3/uL (ref 4.0–10.5)
nRBC: 0 % (ref 0.0–0.2)

## 2020-10-29 LAB — COMPREHENSIVE METABOLIC PANEL
ALT: 17 U/L (ref 0–44)
AST: 19 U/L (ref 15–41)
Albumin: 4.4 g/dL (ref 3.5–5.0)
Alkaline Phosphatase: 57 U/L (ref 38–126)
Anion gap: 8 (ref 5–15)
BUN: 22 mg/dL (ref 8–23)
CO2: 28 mmol/L (ref 22–32)
Calcium: 9.5 mg/dL (ref 8.9–10.3)
Chloride: 106 mmol/L (ref 98–111)
Creatinine, Ser: 0.99 mg/dL (ref 0.44–1.00)
GFR, Estimated: 60 mL/min — ABNORMAL LOW (ref >60)
Glucose, Bld: 117 mg/dL — ABNORMAL HIGH (ref 70–99)
Potassium: 4.1 mmol/L (ref 3.5–5.1)
Sodium: 142 mmol/L (ref 135–145)
Total Bilirubin: 0.4 mg/dL (ref 0.3–1.2)
Total Protein: 6.5 g/dL (ref 6.5–8.1)

## 2020-10-29 LAB — SEDIMENTATION RATE: Sed Rate: 18 mm/hr (ref 0–22)

## 2020-10-29 LAB — PROTIME-INR
INR: 1 (ref 0.8–1.2)
Prothrombin Time: 13.3 seconds (ref 11.4–15.2)

## 2020-10-29 LAB — AMYLASE: Amylase: 31 U/L (ref 28–100)

## 2020-10-29 MED ORDER — PFIZER-BIONT COVID-19 VAC-TRIS 30 MCG/0.3ML IM SUSP
INTRAMUSCULAR | 0 refills | Status: AC
  Filled 2020-10-29: qty 0.3, 1d supply, fill #0

## 2020-10-29 NOTE — Progress Notes (Signed)
   Covid-19 Vaccination Clinic  Name:  Beverly [name]    MRN: [id number] DOB: 07-09-1946  10/29/2020  [name] was observed post Covid-19 immunization for 15 minutes without incident. She was provided with Vaccine Information Sheet and instruction to access the V-Safe system.   [name] was instructed to call 911 with any severe reactions post vaccine: Marland Kitchen Difficulty breathing  . Swelling of face and throat  . A fast heartbeat  . A bad rash all over body  . Dizziness and weakness   Immunizations Administered    Name Date Dose VIS Date Route   PFIZER Comrnaty(Gray TOP) Covid-19 Vaccine 10/29/2020 10:21 AM 0.3 mL 06/19/2020 Intramuscular   Manufacturer: [employer], [employer]   Lot: [id number]   NDC: [phone]

## 2020-10-30 LAB — LIPASE, BLOOD: Lipase: 15 U/L (ref 11–51)

## 2020-11-07 ENCOUNTER — Other Ambulatory Visit (HOSPITAL_COMMUNITY): Payer: Self-pay

## 2020-11-10 ENCOUNTER — Other Ambulatory Visit (HOSPITAL_COMMUNITY): Payer: Self-pay

## 2021-01-15 DIAGNOSIS — T63441A Toxic effect of venom of bees, accidental (unintentional), initial encounter: Secondary | ICD-10-CM | POA: Insufficient documentation

## 2021-01-15 HISTORY — DX: Toxic effect of venom of bees, accidental (unintentional), initial encounter: T63.441A

## 2021-02-10 ENCOUNTER — Other Ambulatory Visit: Payer: Self-pay | Admitting: Allergy and Immunology

## 2021-02-10 ENCOUNTER — Ambulatory Visit (INDEPENDENT_AMBULATORY_CARE_PROVIDER_SITE_OTHER): Payer: Medicare Other | Admitting: Allergy and Immunology

## 2021-02-10 ENCOUNTER — Other Ambulatory Visit: Payer: Self-pay

## 2021-02-10 VITALS — BP 126/86 | HR 70 | Temp 98.4°F | Resp 18 | Ht 64.5 in | Wt 125.0 lb

## 2021-02-10 DIAGNOSIS — J4540 Moderate persistent asthma, uncomplicated: Secondary | ICD-10-CM | POA: Diagnosis not present

## 2021-02-10 DIAGNOSIS — K219 Gastro-esophageal reflux disease without esophagitis: Secondary | ICD-10-CM | POA: Diagnosis not present

## 2021-02-10 DIAGNOSIS — J3089 Other allergic rhinitis: Secondary | ICD-10-CM

## 2021-02-10 MED ORDER — TRELEGY ELLIPTA 100-62.5-25 MCG/INH IN AEPB: 1.0000 | INHALATION_SPRAY | Freq: Every day | RESPIRATORY_TRACT | 5 refills | Status: DC

## 2021-02-10 MED ORDER — ALBUTEROL SULFATE HFA 108 (90 BASE) MCG/ACT IN AERS: 2.0000 | INHALATION_SPRAY | RESPIRATORY_TRACT | 3 refills | Status: DC | PRN

## 2021-02-10 MED ORDER — EPINEPHRINE 0.3 MG/0.3ML IJ SOAJ: 0.3000 mg | Freq: Once | INTRAMUSCULAR | 1 refills | Status: AC

## 2021-02-10 MED ORDER — FAMOTIDINE 20 MG PO TABS: 20.0000 mg | ORAL_TABLET | Freq: Every evening | ORAL | 1 refills | Status: DC | PRN

## 2021-02-10 MED ORDER — PANTOPRAZOLE SODIUM 40 MG PO TBEC
40.0000 mg | DELAYED_RELEASE_TABLET | Freq: Every morning | ORAL | 5 refills | Status: DC
Start: 2021-02-10 — End: 2021-06-29

## 2021-02-10 MED ORDER — CETIRIZINE HCL 10 MG PO TABS: 10.0000 mg | ORAL_TABLET | Freq: Every day | ORAL | 5 refills | Status: DC

## 2021-02-10 MED ORDER — FLUTICASONE PROPIONATE 50 MCG/ACT NA SUSP: NASAL | 1 refills | Status: DC

## 2021-02-10 MED ORDER — MONTELUKAST SODIUM 10 MG PO TABS
10.0000 mg | ORAL_TABLET | Freq: Every day | ORAL | 1 refills | Status: DC
Start: 2021-02-10 — End: 2022-09-07

## 2021-02-10 NOTE — Progress Notes (Signed)
[place] - [place] - [place] - [place]   Follow-up Note  Referring Provider: Nila Nephew, MD Primary Provider: Margot Ables, MD Date of Office Visit: 02/10/2021  Subjective:   Beverly [name] (DOB: [birth year]) is a 75 y.o. female who [facility]21 in re-evaluation of the following:  HPI: Arrow returns to this clinic in reevaluation of asthma and allergic rhinitis and LPR.  Her last visit to this clinic was 12 August 2020.  Overall she believes that her asthma is under pretty good control.  She performs yoga with no difficulty.  She does not use a short acting bronchodilator.  She has not required a systemic steroid to treat an exacerbation of asthma.  She continues to use Breo on a consistent basis.  And her upper airway has not really been causing her any problem and she has not required an antibiotic to treat an episode of sinusitis while using a nasal steroid.  But she still has some intermittent cough.  And she also has some intermittent throat clearing and some intermittent drainage in her postnasal area and some intermittent raspy voice.  This appears to be more prevalent during seasons of the year but is an issue that is intermittent throughout the entire year.  She believes that her reflux is under good control while using a proton pump inhibitor once a day and occasionally she will add in a H2 receptor blocker in the evening.  She was stung by approximately 1 dozen yellow jackets while gardening and developed very large local reactions without any associated systemic or constitutional symptoms.  She inquires about whether or not she would need a EpiPen for every time she is stung each successive exposure appears to result in much larger reactions.  Allergies as of 02/10/2021   No Known Allergies      Medication List    albuterol 108 (90 Base) MCG/ACT inhaler Commonly known as: ProAir HFA Inhale 2 puffs  into the lungs every 4 (four) hours as needed for wheezing or shortness of breath.   Breo Ellipta 200-25 MCG/INH Aepb Generic drug: fluticasone furoate-vilanterol INHALE 1 PUFF BY MOUTH EVERY DAY   cetirizine 10 MG tablet Commonly known as: ZYRTEC Take 1 tablet (10 mg total) by mouth daily.   Estradiol-Norethindrone Acet 0.5-0.1 MG tablet Take 1 tablet by mouth daily.   famotidine 20 MG tablet Commonly known as: Pepcid Take 1 tablet (20 mg total) by mouth every evening.   fluticasone 50 MCG/ACT nasal spray Commonly known as: FLONASE USE 1 SPRAY IN EACH NOSTRIL 1 TO 2 TIMES A DAY DEPENDING ON UPPER AIRWAY   GLUCOSAMINE-CHONDROITIN PO Take by mouth.   multivitamin tablet Take 1 tablet by mouth daily.   pantoprazole 40 MG tablet Commonly known as: Protonix Take 1 tablet (40 mg total) by mouth daily.   SOLUBLE FIBER/PROBIOTICS PO Take by mouth.        Past Medical History:  Diagnosis Date   Allergic rhinitis    Allergy    Anemia    Asthma    Cataract    bilateral cataracts removed   Chronic cough    Esophageal reflux    IBS (irritable bowel syndrome)    Osteopenia 03/14/2013   DEXA @[facility]  02/2013: -1.8    Past Surgical History:  Procedure Laterality Date   bilateral cateracts removed     BREAST LUMPECTOMY Left    FOOT SURGERY     right   LAPAROSCOPIC  CHOLECYSTECTOMY  11/2009   right foot bunionectomy     TUBAL LIGATION      Review of systems negative except as noted in HPI / PMHx or noted below:  Review of Systems  Constitutional: Negative.   HENT: Negative.    Eyes: Negative.   Respiratory: Negative.    Cardiovascular: Negative.   Gastrointestinal: Negative.   Genitourinary: Negative.   Musculoskeletal: Negative.   Skin: Negative.   Neurological: Negative.   Endo/Heme/Allergies: Negative.   Psychiatric/Behavioral: Negative.      Objective:   Vitals:   02/10/21 1403  BP: 126/86  Pulse: 70  Resp: 18  Temp: 98.4 F (36.9 C)  SpO2: 98%    Height: 5' 4.5" (163.8 cm)  Weight: 125 lb (56.7 kg)   Physical Exam Constitutional:      Appearance: She is not diaphoretic.  HENT:     Head: Normocephalic.     Right Ear: Tympanic membrane, ear canal and external ear normal.     Left Ear: Tympanic membrane, ear canal and external ear normal.     Nose: Nose normal. No mucosal edema or rhinorrhea.     Mouth/Throat:     Pharynx: Uvula midline. No oropharyngeal exudate.  Eyes:     Conjunctiva/sclera: Conjunctivae normal.  Neck:     Thyroid: No thyromegaly.     Trachea: Trachea normal. No tracheal tenderness or tracheal deviation.  Cardiovascular:     Rate and Rhythm: Normal rate and regular rhythm.     Heart sounds: Normal heart sounds, S1 normal and S2 normal. No murmur heard. Pulmonary:     Effort: No respiratory distress.     Breath sounds: Normal breath sounds. No stridor. No wheezing or rales.  Lymphadenopathy:     Head:     Right side of head: No tonsillar adenopathy.     Left side of head: No tonsillar adenopathy.     Cervical: No cervical adenopathy.  Skin:    Findings: No erythema or rash.     Nails: There is no clubbing.  Neurological:     Mental Status: She is alert.    Diagnostics:    Spirometry was performed and demonstrated an FEV1 of 1.56 at 73 % of predicted.  Assessment and Plan:   1. Not well controlled moderate persistent asthma   2. Other allergic rhinitis   3. LPRD (laryngopharyngeal reflux disease)     1. Try sample of Trelegy 100 - 1 inhalation 1 time per day.    2. Continue Flonase - 1 spray each nostril 1-2 times per day    3. Continue to treat reflux:  A. Pantoprazole 40 mg 1 time a day in AM B. Famotidine 40 mg - 1 tablet in evening if needed C. Minimize caffeine, chocolate, alcohol consumption  4. If needed:  A. Zyrtec  B. Albuterol HFA C. Epi-Pen, Benadryl, MD/ER evaluation  5. Does Trelegy make a difference??? Further treatment???    6. Return to clinic in 6 months or  earlier if problem   7. Obtain fall flu vaccine  Siearra still has some evidence of inflammation affecting her airway and I am going to give her a triple inhaler to replace her double inhaler and we will see if she gets a little bit less respiratory tract symptoms in the face of the treatment.  As well, I made some suggestions about getting the reflux component of her respiratory tract issue under better control as noted above.  She will contact me noting whether  or not Trelegy gives her any benefit.  We can also consider providing her a biologic agent should she still continues to have problems in the face of the therapy noted above.  Overall she is pretty satisfied with the response that she is receiving although there may be an opportunity to get her even more benefit in the future.  Concerning her large local reactions to hymenoptera, we will not evaluate her for hymenoptera allergy at this point unless she develops a reaction that is distant from her sting site.  To err on the safe side we did provide her an EpiPen.  Laurette Schimke, MD Allergy / Immunology [facility]

## 2021-02-10 NOTE — Patient Instructions (Addendum)
  1. Try sample of Trelegy 100 - 1 inhalation 1 time per day.    2. Continue Flonase - 1 spray each nostril 1-2 times per day    3. Continue to treat reflux:  A. Pantoprazole 40 mg 1 time a day in AM B. Famotidine 40 mg - 1 tablet in evening if needed C. Minimize caffeine, chocolate, alcohol consumption  4. If needed:  A. Zyrtec  B. Albuterol HFA C. Epi-Pen, Benadryl, MD/ER evaluation  5. Does Trelegy make a difference??? Further treatment???    6. Return to clinic in 6 months or earlier if problem   7. Obtain fall flu vaccine

## 2021-02-11 ENCOUNTER — Encounter: Payer: Self-pay | Admitting: Allergy and Immunology

## 2021-02-18 ENCOUNTER — Telehealth: Payer: Self-pay | Admitting: Allergy and Immunology

## 2021-02-18 MED ORDER — EPINEPHRINE 0.3 MG/0.3ML IJ SOAJ: 0.3000 mg | Freq: Once | INTRAMUSCULAR | 1 refills | Status: AC

## 2021-02-18 NOTE — Telephone Encounter (Signed)
[facility]. Informed patient that the epi pen was called into [facility] and the script is "ended". [facility] [address] and call patient when script is sent in.

## 2021-02-18 NOTE — Telephone Encounter (Signed)
Called and informed patient that the epipen was sent over to her pharmacy. [facility]

## 2021-04-23 ENCOUNTER — Ambulatory Visit: Payer: Medicare Other | Attending: Internal Medicine

## 2021-04-23 ENCOUNTER — Other Ambulatory Visit (HOSPITAL_BASED_OUTPATIENT_CLINIC_OR_DEPARTMENT_OTHER): Payer: Self-pay

## 2021-04-23 DIAGNOSIS — Z23 Encounter for immunization: Secondary | ICD-10-CM

## 2021-04-23 MED ORDER — PFIZER COVID-19 VAC BIVALENT 30 MCG/0.3ML IM SUSP
INTRAMUSCULAR | 0 refills | Status: AC
  Filled 2021-04-23: qty 0.3, 1d supply, fill #0

## 2021-04-23 NOTE — Progress Notes (Signed)
   Covid-19 Vaccination Clinic  Name:  Beverly [name]    MRN: [id number] DOB: 04-Dec-1945  04/23/2021  [name] was observed post Covid-19 immunization for 15 minutes without incident. She was provided with Vaccine Information Sheet and instruction to access the V-Safe system.   [name] was instructed to call 911 with any severe reactions post vaccine: Difficulty breathing  Swelling of face and throat  A fast heartbeat  A bad rash all over body  Dizziness and weakness

## 2021-04-28 ENCOUNTER — Other Ambulatory Visit (HOSPITAL_COMMUNITY): Payer: Self-pay

## 2021-05-16 ENCOUNTER — Other Ambulatory Visit: Payer: Self-pay | Admitting: Allergy and Immunology

## 2021-06-29 ENCOUNTER — Other Ambulatory Visit: Payer: Self-pay | Admitting: Allergy and Immunology

## 2021-08-03 ENCOUNTER — Telehealth: Payer: Self-pay

## 2021-08-03 NOTE — Telephone Encounter (Signed)
Patient called to request a different inhaler than her Memory Dance because it is no longer working for her. She stated she tried the Trelogy that was provided on last visit (sample) for 5 days and did not notice a difference. Patient also says about time to get another inhaler so she would like to try something else before she picks up her Breo. She is willing to try the Trelogy for a longer period of time if it will work. Please advise what you would like to do.

## 2021-08-18 ENCOUNTER — Other Ambulatory Visit: Payer: Self-pay

## 2021-08-18 ENCOUNTER — Ambulatory Visit (INDEPENDENT_AMBULATORY_CARE_PROVIDER_SITE_OTHER): Payer: Medicare Other | Admitting: Allergy and Immunology

## 2021-08-18 ENCOUNTER — Encounter: Payer: Self-pay | Admitting: Allergy and Immunology

## 2021-08-18 VITALS — BP 130/80 | HR 72 | Temp 97.2°F | Resp 16 | Ht 64.0 in | Wt 127.6 lb

## 2021-08-18 DIAGNOSIS — J4540 Moderate persistent asthma, uncomplicated: Secondary | ICD-10-CM | POA: Diagnosis not present

## 2021-08-18 DIAGNOSIS — K219 Gastro-esophageal reflux disease without esophagitis: Secondary | ICD-10-CM | POA: Diagnosis not present

## 2021-08-18 DIAGNOSIS — J3089 Other allergic rhinitis: Secondary | ICD-10-CM

## 2021-08-18 MED ORDER — ALBUTEROL SULFATE HFA 108 (90 BASE) MCG/ACT IN AERS: 2.0000 | INHALATION_SPRAY | RESPIRATORY_TRACT | 1 refills | Status: DC | PRN

## 2021-08-18 MED ORDER — FAMOTIDINE 40 MG PO TABS: 40.0000 mg | ORAL_TABLET | Freq: Every evening | ORAL | 1 refills | Status: DC

## 2021-08-18 MED ORDER — AEROCHAMBER PLUS MISC: 1.0000 | Freq: Once | 1 refills | Status: AC

## 2021-08-18 MED ORDER — EPINEPHRINE 0.3 MG/0.3ML IJ SOAJ: 0.3000 mg | INTRAMUSCULAR | 1 refills | Status: DC

## 2021-08-18 MED ORDER — FLUTICASONE PROPIONATE 50 MCG/ACT NA SUSP: 1.0000 | Freq: Two times a day (BID) | NASAL | 1 refills | Status: DC | PRN

## 2021-08-18 MED ORDER — PANTOPRAZOLE SODIUM 40 MG PO TBEC: 40.0000 mg | DELAYED_RELEASE_TABLET | Freq: Every morning | ORAL | 1 refills | Status: DC

## 2021-08-18 MED ORDER — BREZTRI AEROSPHERE 160-9-4.8 MCG/ACT IN AERO: 2.0000 | INHALATION_SPRAY | Freq: Two times a day (BID) | RESPIRATORY_TRACT | 5 refills | Status: DC

## 2021-08-18 MED ORDER — CETIRIZINE HCL 10 MG PO TABS: 10.0000 mg | ORAL_TABLET | Freq: Two times a day (BID) | ORAL | 1 refills | Status: DC | PRN

## 2021-08-18 NOTE — Patient Instructions (Addendum)
°  1. Continue Breztri - 2 inhalations 2 times per day w/ spacer (empty lungs)    2. Continue Flonase - 1 spray each nostril 1-2 times per day    3. Continue to treat reflux:  A. Pantoprazole 40 mg 1 time a day in AM B. Famotidine 40 mg - 1 tablet in evening if needed C. Minimize caffeine, chocolate, alcohol consumption  4. If needed:  A. Zyrtec  B. Albuterol HFA C. Epi-Pen, Benadryl, MD/ER evaluation  5. Further evaluation for raspy voice???    6. Return to clinic in 6 months or earlier if problem   7. Obtain fall flu vaccine

## 2021-08-18 NOTE — Progress Notes (Signed)
[place] - [place] - [place] - [place] - [place]   Follow-up Note  Referring Provider: Margot Ables* Primary Provider: Margot Ables, MD Date of Office Visit: 08/18/2021  Subjective:   Beverly [name] (DOB: [birth year]) is a 76 y.o. female who [facility] in re-evaluation of the following:  HPI: Beverly [name] returns to this clinic in reevaluation of asthma and allergic rhinitis and LPR.  Her last visit to this clinic was 10 February 2021.  With treatment directed against inflammation of her airway and treatment directed against reflux induced respiratory disease she is better but certainly continues to have some cough.  Sometimes she is woken up at nighttime with a little bit of cough.  Sometimes at yoga when she lays down she feels the need to cough.  But overall she has this issue under okay control on her current plan.  She was intolerant of using trelegy and she is now using a sample of Breztri.  She has noticed a little bit of raspy voice since using the [place].  She is not using a spacer with this device.  She had very little issues of their upper airway.  She is aggressively treating her reflux with pantoprazole and famotidine.  She is basically caffeine free. She has 1 wine around 5 PM in the afternoon.  If she eats any chocolate she pays for it significantly with lots of throat problems and coughing.  She has received 5 COVID vaccines and this year's flu vaccine.  Allergies as of 08/18/2021   No Known Allergies      Medication List    albuterol 108 (90 Base) MCG/ACT inhaler Commonly known as: ProAir HFA Inhale 2 puffs into the lungs every 4 (four) hours as needed for wheezing or shortness of breath.   calcium carbonate 100 mg/ml Susp Take 1 tablet by mouth daily at 12 noon.   cetirizine 10 MG tablet Commonly known as: ZYRTEC TAKE 1 TABLET BY MOUTH EVERY DAY   EPINEPHrine 0.3 mg/0.3 mL Soaj  injection Commonly known as: EPI-PEN Inject 0.3 mLs into the muscle as directed.   Estradiol-Norethindrone Acet 0.5-0.1 MG tablet Take 1 tablet by mouth daily.   famotidine 20 MG tablet Commonly known as: Pepcid Take 1 tablet (20 mg total) by mouth at bedtime as needed for heartburn or indigestion.   fluticasone 50 MCG/ACT nasal spray Commonly known as: FLONASE USE 1 SPRAY IN EACH NOSTRIL 1 TO 2 TIMES A DAY DEPENDING ON UPPER AIRWAY   GLUCOSAMINE-CHONDROITIN PO Take by mouth.   montelukast 10 MG tablet Commonly known as: SINGULAIR Take 1 tablet (10 mg total) by mouth daily.   multivitamin tablet Take 1 tablet by mouth daily.   pantoprazole 40 MG tablet Commonly known as: PROTONIX TAKE 1 TABLET BY MOUTH EVERY DAY   Pfizer COVID-19 Vac Bivalent injection Generic drug: COVID-19 mRNA bivalent vaccine (Pfizer) Inject into the muscle.   Pfizer-BioNT COVID-19 Vac-TriS Susp injection Generic drug: COVID-19 mRNA Vac-TriS (Pfizer) Inject into the muscle.   SOLUBLE FIBER/PROBIOTICS PO Take by mouth.   VITAMIN D BOOSTER PO Take 1 tablet by mouth daily at 12 noon.    Past Medical History:  Diagnosis Date   Allergic rhinitis    Allergy    Anemia    Asthma    Cataract    bilateral cataracts removed   Chronic cough    Esophageal reflux    IBS (irritable bowel syndrome)    Osteopenia 03/14/2013  DEXA @[facility]  02/2013: -1.8    Past Surgical History:  Procedure Laterality Date   bilateral cateracts removed     BREAST LUMPECTOMY Left    FOOT SURGERY     right   LAPAROSCOPIC CHOLECYSTECTOMY  11/2009   right foot bunionectomy     TUBAL LIGATION      Review of systems negative except as noted in HPI / PMHx or noted below:  Review of Systems  Constitutional: Negative.   HENT: Negative.    Eyes: Negative.   Respiratory: Negative.    Cardiovascular: Negative.   Gastrointestinal: Negative.   Genitourinary: Negative.   Musculoskeletal: Negative.   Skin: Negative.    Neurological: Negative.   Endo/Heme/Allergies: Negative.   Psychiatric/Behavioral: Negative.      Objective:   Vitals:   08/18/21 1520  BP: 130/80  Pulse: 72  Resp: 16  Temp: (!) 97.2 F (36.2 C)  SpO2: 97%   Height: 5\' 4"  (162.6 cm)  Weight: 127 lb 9.6 oz (57.9 kg)   Physical Exam Constitutional:      Appearance: She is not diaphoretic.  HENT:     Head: Normocephalic.     Right Ear: Tympanic membrane, ear canal and external ear normal.     Left Ear: Tympanic membrane, ear canal and external ear normal.     Nose: Nose normal. No mucosal edema or rhinorrhea.     Mouth/Throat:     Pharynx: Uvula midline. No oropharyngeal exudate.  Eyes:     Conjunctiva/sclera: Conjunctivae normal.  Neck:     Thyroid: No thyromegaly.     Trachea: Trachea normal. No tracheal tenderness or tracheal deviation.  Cardiovascular:     Rate and Rhythm: Normal rate and regular rhythm.     Heart sounds: Normal heart sounds, S1 normal and S2 normal. No murmur heard. Pulmonary:     Effort: No respiratory distress.     Breath sounds: Normal breath sounds. No stridor. No wheezing or rales.  Lymphadenopathy:     Head:     Right side of head: No tonsillar adenopathy.     Left side of head: No tonsillar adenopathy.     Cervical: No cervical adenopathy.  Skin:    Findings: No erythema or rash.     Nails: There is no clubbing.  Neurological:     Mental Status: She is alert.    Diagnostics:    Spirometry was performed and demonstrated an FEV1 of 1.56 at 76 % of predicted.  Assessment and Plan:   1. Not well controlled moderate persistent asthma   2. Other allergic rhinitis   3. LPRD (laryngopharyngeal reflux disease)     1. Continue Breztri - 2 inhalations 2 times per day w/ spacer (empty lungs)    2. Continue Flonase - 1 spray each nostril 1-2 times per day    3. Continue to treat reflux:  A. Pantoprazole 40 mg 1 time a day in AM B. Famotidine 40 mg - 1 tablet in evening if  needed C. Minimize caffeine, chocolate, alcohol consumption  4. If needed:  A. Zyrtec  B. Albuterol HFA C. Epi-Pen, Benadryl, MD/ER evaluation  5. Further evaluation for raspy voice???    6. Return to clinic in 6 months or earlier if problem   7. Obtain fall flu vaccine  We will have Lakeitha continue to use a triple inhaler although we will have her use a spacing device as this inhaler may be causing some problems with raspy voice.  She will continue  with some nasal steroid and she will continue to aggressively treat her reflux as noted above.  If she continues to have raspy voice I think it be worthwhile to refer her back to ENT as it has been several decades since her last evaluation.  This may be all secondary to the use of her triple inhaler but may also be a sign that her reflux is not under good control or she may have developed some bowing of her vocal cords or some other abnormality.  I will see her back in this clinic in 6 months or earlier if there is a problem.  Laurette Schimke, MD Allergy / Immunology [facility]

## 2021-08-19 ENCOUNTER — Encounter: Payer: Self-pay | Admitting: Allergy and Immunology

## 2021-08-29 ENCOUNTER — Other Ambulatory Visit: Payer: Self-pay | Admitting: Allergy and Immunology

## 2021-08-31 ENCOUNTER — Telehealth: Payer: Self-pay | Admitting: Allergy and Immunology

## 2021-08-31 MED ORDER — PANTOPRAZOLE SODIUM 40 MG PO TBEC: 40.0000 mg | DELAYED_RELEASE_TABLET | Freq: Every morning | ORAL | 1 refills | Status: DC

## 2021-08-31 MED ORDER — BREZTRI AEROSPHERE 160-9-4.8 MCG/ACT IN AERO: 2.0000 | INHALATION_SPRAY | Freq: Two times a day (BID) | RESPIRATORY_TRACT | 1 refills | Status: DC

## 2021-08-31 MED ORDER — EPINEPHRINE 0.3 MG/0.3ML IJ SOAJ: 0.3000 mg | INTRAMUSCULAR | 1 refills | Status: DC

## 2021-08-31 MED ORDER — CETIRIZINE HCL 10 MG PO TABS: 10.0000 mg | ORAL_TABLET | Freq: Two times a day (BID) | ORAL | 1 refills | Status: DC | PRN

## 2021-08-31 MED ORDER — FLUTICASONE PROPIONATE 50 MCG/ACT NA SUSP: 1.0000 | Freq: Two times a day (BID) | NASAL | 1 refills | Status: DC | PRN

## 2021-08-31 MED ORDER — FAMOTIDINE 40 MG PO TABS: 40.0000 mg | ORAL_TABLET | Freq: Every evening | ORAL | 1 refills | Status: DC

## 2021-08-31 MED ORDER — ALBUTEROL SULFATE HFA 108 (90 BASE) MCG/ACT IN AERS: 2.0000 | INHALATION_SPRAY | RESPIRATORY_TRACT | 1 refills | Status: DC | PRN

## 2021-08-31 NOTE — Telephone Encounter (Signed)
Spoke with patient, informed her that [facility] [address] Patient verbalized understanding.

## 2021-08-31 NOTE — Telephone Encounter (Signed)
Patient called to check on status of Breztri prescription. [facility] [address] [facility] [address]  [address] [place] [address]) and prescription was not sent in.   I advised patient that [facility] [address]. Patient states that is the incorrect pharmacy. Patient is [facility] [address] [facility] [address]  [address] [place] [address]  Patient is requesting a call once refills are sent in and would like to speak to someone about this refill. [phone]

## 2021-10-19 ENCOUNTER — Other Ambulatory Visit: Payer: Self-pay | Admitting: Allergy and Immunology

## 2021-11-20 ENCOUNTER — Other Ambulatory Visit (HOSPITAL_BASED_OUTPATIENT_CLINIC_OR_DEPARTMENT_OTHER): Payer: Self-pay

## 2021-11-20 ENCOUNTER — Telehealth: Payer: Self-pay

## 2021-11-20 NOTE — Telephone Encounter (Signed)
I spoke to patient's spouse and he states her primary care doctor sent in Paxlovid for her.  ?

## 2021-11-20 NOTE — Telephone Encounter (Addendum)
Patient's spouse, Reuel Boom, called in - DOB verified - advised patient tested Positive for Covid19 today (Home Test)  - requesting antiviral medication be called in since patient is high risk. ? ?Spouse can be reached @ [phone]. ? ? ? ? ?What are her symptoms?  ?Headache, stiff neck, shoulders, nausea, on the verge of diarrhea, heaving , coughing, running nose, low grade fever - 99.5(@ 11:44 am). Patient states she has not taken anything for fever or aches yet.  ? ?Does she have history of kidney disease?  No ?

## 2021-12-01 DIAGNOSIS — U071 COVID-19: Secondary | ICD-10-CM | POA: Insufficient documentation

## 2021-12-01 HISTORY — DX: COVID-19: U07.1

## 2022-02-15 ENCOUNTER — Other Ambulatory Visit: Payer: Self-pay | Admitting: Allergy and Immunology

## 2022-03-02 ENCOUNTER — Ambulatory Visit (INDEPENDENT_AMBULATORY_CARE_PROVIDER_SITE_OTHER): Payer: Medicare Other | Admitting: Allergy and Immunology

## 2022-03-02 VITALS — BP 120/82 | HR 83 | Temp 98.3°F | Resp 18 | Ht 62.6 in | Wt 176.8 lb

## 2022-03-02 DIAGNOSIS — K219 Gastro-esophageal reflux disease without esophagitis: Secondary | ICD-10-CM

## 2022-03-02 DIAGNOSIS — J4540 Moderate persistent asthma, uncomplicated: Secondary | ICD-10-CM | POA: Diagnosis not present

## 2022-03-02 DIAGNOSIS — M8589 Other specified disorders of bone density and structure, multiple sites: Secondary | ICD-10-CM | POA: Diagnosis not present

## 2022-03-02 DIAGNOSIS — J3089 Other allergic rhinitis: Secondary | ICD-10-CM | POA: Diagnosis not present

## 2022-03-02 DIAGNOSIS — T63481A Toxic effect of venom of other arthropod, accidental (unintentional), initial encounter: Secondary | ICD-10-CM

## 2022-03-02 MED ORDER — ALBUTEROL SULFATE HFA 108 (90 BASE) MCG/ACT IN AERS: 2.0000 | INHALATION_SPRAY | RESPIRATORY_TRACT | 1 refills | Status: DC | PRN

## 2022-03-02 MED ORDER — BREZTRI AEROSPHERE 160-9-4.8 MCG/ACT IN AERO: 2.0000 | INHALATION_SPRAY | Freq: Two times a day (BID) | RESPIRATORY_TRACT | 1 refills | Status: DC

## 2022-03-02 MED ORDER — FLUTICASONE PROPIONATE 50 MCG/ACT NA SUSP
2.0000 | Freq: Every day | NASAL | 1 refills | Status: DC
Start: 2022-03-02 — End: 2022-03-03

## 2022-03-02 MED ORDER — CETIRIZINE HCL 10 MG PO TABS: 10.0000 mg | ORAL_TABLET | Freq: Two times a day (BID) | ORAL | 1 refills | Status: DC | PRN

## 2022-03-02 MED ORDER — FAMOTIDINE 40 MG PO TABS: 40.0000 mg | ORAL_TABLET | Freq: Every evening | ORAL | 1 refills | Status: DC

## 2022-03-02 MED ORDER — DEXLANSOPRAZOLE 60 MG PO CPDR: 60.0000 mg | DELAYED_RELEASE_CAPSULE | Freq: Every morning | ORAL | 5 refills | Status: DC

## 2022-03-02 NOTE — Progress Notes (Unsigned)
[place] - [place] - [place] - [place] - [place]   Follow-up Note  Referring Provider: Margot Ables* Primary Provider: Margot Ables, MD (Inactive) Date of Office Visit: 03/02/2022  Subjective:   Beverly [name] (DOB: 02-Mar-1946) is a 76 y.o. female who [facility]22 in re-evaluation of the following:  HPI: Sabria returns to this clinic in evaluation of asthma, allergic rhinitis, LPR.  Her last visit to this clinic was 18 August 2021.  She contracted COVID May 2021 at which point time she contacted this clinic within 48 hours of onset of symptoms and we gave her Paxlovid.  She still had very significant upper and lower airway symptoms and gastrointestinal symptoms and was sick for many weeks.  Fortunately, she resolved that issue.  She is done relatively well with her asthma.  She can exercise without too much problem.  She does not use a short acting bronchodilator.  For the most part she has been using her combination inhaler just 1 time per day.   She does not believe that her reflux is under very good control on her current plan of using pantoprazole and famotidine.  She still has some regurgitation events.  She would like to go back on Dexilant.  She still has a chronic cough associated with some throat clearing that is never completely cleared no matter what type of therapy she has been administered in the past.  Her last chest x-ray was 10 years ago.  She informs me that her last bone densitometry scan was before the COVID pandemic and apparently demonstrated osteopenia.  She has not had a follow-up bone densitometry scan since that diagnosis has been established.  Allergies as of 03/02/2022   No Known Allergies      Medication List    albuterol 108 (90 Base) MCG/ACT inhaler Commonly known as: Ventolin HFA Inhale 2 puffs into the lungs every 4 (four) hours as needed for wheezing or shortness of  breath.   Breo Ellipta 200-25 MCG/ACT Aepb Generic drug: fluticasone furoate-vilanterol INHALE 1 PUFF BY MOUTH EVERY DAY   Breztri Aerosphere 160-9-4.8 MCG/ACT Aero Generic drug: Budeson-Glycopyrrol-Formoterol Inhale 2 puffs into the lungs in the morning and at bedtime.   calcium carbonate 100 mg/ml Susp Take 1 tablet by mouth daily at 12 noon.   cetirizine 10 MG tablet Commonly known as: ZYRTEC Take 1 tablet (10 mg total) by mouth 2 (two) times daily as needed for allergies (Can take an extra dose during flare ups.).   EPINEPHrine 0.3 mg/0.3 mL Soaj injection Commonly known as: EPI-PEN Inject 0.3 mg into the muscle as directed.   Estradiol-Norethindrone Acet 0.5-0.1 MG tablet Take 1 tablet by mouth daily.   famotidine 40 MG tablet Commonly known as: PEPCID Take 1 tablet (40 mg total) by mouth at bedtime.   fluticasone 50 MCG/ACT nasal spray Commonly known as: FLONASE USE 1 SPRAY IN BOTH NOSTRILS TWICE A DAY AS NEEDED FOR ALLERGIES OR RHINITITS   GLUCOSAMINE-CHONDROITIN PO Take by mouth.   montelukast 10 MG tablet Commonly known as: SINGULAIR Take 1 tablet (10 mg total) by mouth daily.   multivitamin tablet Take 1 tablet by mouth daily.   pantoprazole 40 MG tablet Commonly known as: PROTONIX Take 1 tablet (40 mg total) by mouth in the morning.   Pfizer COVID-19 Vac Bivalent injection Generic drug: COVID-19 mRNA bivalent vaccine [employer]) Inject into the muscle.   Pfizer-BioNT COVID-19 Vac-TriS Susp injection Generic drug: COVID-19 mRNA Vac-TriS [employer])  Inject into the muscle.   SOLUBLE FIBER/PROBIOTICS PO Take by mouth.   VITAMIN D BOOSTER PO Take 1 tablet by mouth daily at 12 noon.    Past Medical History:  Diagnosis Date   Allergic rhinitis    Allergy    Anemia    Asthma    Cataract    bilateral cataracts removed   Chronic cough    Esophageal reflux    IBS (irritable bowel syndrome)    Osteopenia 03/14/2013   DEXA @[facility]  02/2013: -1.8    Past  Surgical History:  Procedure Laterality Date   bilateral cateracts removed     BREAST LUMPECTOMY Left    FOOT SURGERY     right   LAPAROSCOPIC CHOLECYSTECTOMY  11/2009   right foot bunionectomy     TUBAL LIGATION      Review of systems negative except as noted in HPI / PMHx or noted below:  Review of Systems  Constitutional: Negative.   HENT: Negative.    Eyes: Negative.   Respiratory: Negative.    Cardiovascular: Negative.   Gastrointestinal: Negative.   Genitourinary: Negative.   Musculoskeletal: Negative.   Skin: Negative.   Neurological: Negative.   Endo/Heme/Allergies: Negative.   Psychiatric/Behavioral: Negative.       Objective:   Vitals:   03/02/22 1606  BP: 120/82  Pulse: 83  Resp: 18  Temp: 98.3 F (36.8 C)  SpO2: 98%   Height: 5' 2.6" (159 cm)  Weight: 176 lb 12.8 oz (80.2 kg)   Physical Exam Constitutional:      Appearance: She is not diaphoretic.     Comments: Throat clearing, slight cough  HENT:     Head: Normocephalic.     Right Ear: Tympanic membrane, ear canal and external ear normal.     Left Ear: Tympanic membrane, ear canal and external ear normal.     Nose: Nose normal. No mucosal edema or rhinorrhea.     Mouth/Throat:     Pharynx: Uvula midline. No oropharyngeal exudate.  Eyes:     Conjunctiva/sclera: Conjunctivae normal.  Neck:     Thyroid: No thyromegaly.     Trachea: Trachea normal. No tracheal tenderness or tracheal deviation.  Cardiovascular:     Rate and Rhythm: Normal rate and regular rhythm.     Heart sounds: Normal heart sounds, S1 normal and S2 normal. No murmur heard. Pulmonary:     Effort: No respiratory distress.     Breath sounds: Normal breath sounds. No stridor. No wheezing or rales.  Lymphadenopathy:     Head:     Right side of head: No tonsillar adenopathy.     Left side of head: No tonsillar adenopathy.     Cervical: No cervical adenopathy.  Skin:    Findings: No erythema or rash.     Nails: There is no  clubbing.  Neurological:     Mental Status: She is alert.     Diagnostics:    Spirometry was performed and demonstrated an FEV1 of 1.51 at 79 % of predicted.  Assessment and Plan:   1. Not well controlled moderate persistent asthma   2. Other allergic rhinitis   3. LPRD (laryngopharyngeal reflux disease)   4. Osteopenia of multiple sites      1. Continue Breztri - 2 inhalations 2 times per day w/ spacer (empty lungs)    2. Continue Flonase - 1 spray each nostril 1-2 times per day    3. Continue to treat reflux:  A. Pantoprazole 40  mg 1 time a day in AM B. Famotidine 40 mg - 1 tablet in evening if needed C. Minimize caffeine, chocolate, alcohol consumption  4. If needed:  A. Zyrtec  B. Albuterol HFA C. Epi-Pen, Benadryl, MD/ER evaluation  5. Further evaluation for raspy voice???    6. Return to clinic in 6 months or earlier if problem   7. Obtain fall flu vaccine              Laurette Schimke, MD Allergy / Immunology [facility]

## 2022-03-02 NOTE — Patient Instructions (Addendum)
  1. Continue Breztri - 2 inhalations 1-2 times per day w/ spacer (empty lungs)    2. Continue Flonase - 1 spray each nostril 1-2 times per day    3. Continue to treat reflux:  A. Dexilant 60 mg 1 time a day in AM (replace pantoprazole) B. Famotidine 40 mg - 1 tablet in evening if needed C. Minimize caffeine, chocolate, alcohol consumption  4. If needed:  A. Zyrtec  B. Albuterol HFA C. Epi-Pen, Benadryl, MD/ER evaluation  5. Obtain Chest X-ray  6. Obtain bone densitometry scan for osteopenia    7. Return to clinic in 6 months or earlier if problem   8. Obtain fall flu vaccine, covid vaccine, RSV vaccine

## 2022-03-03 ENCOUNTER — Encounter: Payer: Self-pay | Admitting: Allergy and Immunology

## 2022-03-03 ENCOUNTER — Telehealth: Payer: Self-pay

## 2022-03-03 MED ORDER — BREZTRI AEROSPHERE 160-9-4.8 MCG/ACT IN AERO
2.0000 | INHALATION_SPRAY | Freq: Two times a day (BID) | RESPIRATORY_TRACT | 1 refills | Status: DC
Start: 2022-03-03 — End: 2022-09-07

## 2022-03-03 MED ORDER — FLUTICASONE PROPIONATE 50 MCG/ACT NA SUSP
2.0000 | Freq: Every day | NASAL | 1 refills | Status: DC
Start: 2022-03-03 — End: 2022-09-07

## 2022-03-03 MED ORDER — CETIRIZINE HCL 10 MG PO TABS: 10.0000 mg | ORAL_TABLET | Freq: Two times a day (BID) | ORAL | 1 refills | Status: DC | PRN

## 2022-03-03 MED ORDER — DEXLANSOPRAZOLE 60 MG PO CPDR: 60.0000 mg | DELAYED_RELEASE_CAPSULE | Freq: Every morning | ORAL | 5 refills | Status: DC

## 2022-03-03 MED ORDER — FAMOTIDINE 40 MG PO TABS: 40.0000 mg | ORAL_TABLET | Freq: Every evening | ORAL | 1 refills | Status: DC

## 2022-03-03 MED ORDER — ALBUTEROL SULFATE HFA 108 (90 BASE) MCG/ACT IN AERS: 2.0000 | INHALATION_SPRAY | RESPIRATORY_TRACT | 1 refills | Status: DC | PRN

## 2022-03-03 NOTE — Addendum Note (Signed)
Addended by: Orson Aloe on: 03/03/2022 10:58 AM   Modules accepted: Orders

## 2022-03-03 NOTE — Telephone Encounter (Signed)
[facility] [address] [facility]'s request. The [facility] pharmacy has been deleted from the patient's chart.

## 2022-03-08 ENCOUNTER — Ambulatory Visit (HOSPITAL_BASED_OUTPATIENT_CLINIC_OR_DEPARTMENT_OTHER)
Admission: RE | Admit: 2022-03-08 | Discharge: 2022-03-08 | Disposition: A | Discharge status: Home / Self Care | Payer: Medicare Other | Source: Ambulatory Visit | Attending: Allergy and Immunology | Admitting: Allergy and Immunology

## 2022-03-08 ENCOUNTER — Ambulatory Visit (HOSPITAL_BASED_OUTPATIENT_CLINIC_OR_DEPARTMENT_OTHER): Admission: RE | Admit: 2022-03-08 | Payer: Medicare Other | Source: Ambulatory Visit

## 2022-03-08 ENCOUNTER — Ambulatory Visit (HOSPITAL_COMMUNITY): Admission: RE | Admit: 2022-03-08 | Payer: BLUE CROSS/BLUE SHIELD | Source: Ambulatory Visit

## 2022-03-08 DIAGNOSIS — K219 Gastro-esophageal reflux disease without esophagitis: Secondary | ICD-10-CM | POA: Insufficient documentation

## 2022-03-11 ENCOUNTER — Telehealth: Payer: Self-pay | Admitting: Allergy and Immunology

## 2022-03-11 NOTE — Telephone Encounter (Signed)
Patient called back. I informed her of Dr. Kathyrn Lass note in regards to her chest x ray. Patient verbalized understanding.

## 2022-04-13 ENCOUNTER — Other Ambulatory Visit: Payer: Self-pay | Admitting: Allergy and Immunology

## 2022-04-13 NOTE — Telephone Encounter (Signed)
Patient husband came by to and drop off paper work about needs a prior approval for Dexilant - 60 mg. Her plan is [place] p[employer]  [id number]. Please submit to them by phone [phone] or by fax .# [phone]. Thank you

## 2022-04-19 ENCOUNTER — Other Ambulatory Visit (HOSPITAL_BASED_OUTPATIENT_CLINIC_OR_DEPARTMENT_OTHER): Payer: Self-pay

## 2022-04-19 ENCOUNTER — Other Ambulatory Visit (HOSPITAL_BASED_OUTPATIENT_CLINIC_OR_DEPARTMENT_OTHER): Payer: Self-pay | Admitting: Nurse Practitioner

## 2022-04-19 DIAGNOSIS — Z20818 Contact with and (suspected) exposure to other bacterial communicable diseases: Secondary | ICD-10-CM

## 2022-04-19 MED ORDER — AMOXICILLIN-POT CLAVULANATE 500-125 MG PO TABS
1.0000 | ORAL_TABLET | Freq: Two times a day (BID) | ORAL | 0 refills | Status: DC
  Filled 2022-04-19: qty 14, 7d supply, fill #0

## 2022-04-28 ENCOUNTER — Telehealth: Payer: Self-pay

## 2022-04-28 NOTE — Telephone Encounter (Signed)
I called the patient to get their numbers off their [employer] card so I can do a PA for the Dexilant 60mg .

## 2022-05-07 DIAGNOSIS — J019 Acute sinusitis, unspecified: Secondary | ICD-10-CM

## 2022-05-07 HISTORY — DX: Acute sinusitis, unspecified: J01.90

## 2022-05-10 ENCOUNTER — Ambulatory Visit (HOSPITAL_BASED_OUTPATIENT_CLINIC_OR_DEPARTMENT_OTHER): Payer: BLUE CROSS/BLUE SHIELD | Admitting: Nurse Practitioner

## 2022-05-24 ENCOUNTER — Encounter (HOSPITAL_BASED_OUTPATIENT_CLINIC_OR_DEPARTMENT_OTHER): Payer: Self-pay

## 2022-05-24 ENCOUNTER — Ambulatory Visit (HOSPITAL_BASED_OUTPATIENT_CLINIC_OR_DEPARTMENT_OTHER): Admission: RE | Admit: 2022-05-24 | Payer: Medicare Other | Source: Ambulatory Visit

## 2022-05-26 ENCOUNTER — Other Ambulatory Visit (HOSPITAL_COMMUNITY): Payer: Self-pay | Admitting: Obstetrics & Gynecology

## 2022-05-26 DIAGNOSIS — R1012 Left upper quadrant pain: Secondary | ICD-10-CM

## 2022-05-31 ENCOUNTER — Other Ambulatory Visit (HOSPITAL_BASED_OUTPATIENT_CLINIC_OR_DEPARTMENT_OTHER): Payer: Self-pay

## 2022-05-31 MED ORDER — INFLUENZA VAC A&B SA ADJ QUAD 0.5 ML IM PRSY
PREFILLED_SYRINGE | INTRAMUSCULAR | 0 refills | Status: AC
  Filled 2022-05-31: qty 0.5, 1d supply, fill #0

## 2022-06-09 ENCOUNTER — Other Ambulatory Visit (HOSPITAL_BASED_OUTPATIENT_CLINIC_OR_DEPARTMENT_OTHER): Payer: Self-pay

## 2022-06-09 ENCOUNTER — Ambulatory Visit (HOSPITAL_BASED_OUTPATIENT_CLINIC_OR_DEPARTMENT_OTHER)
Admission: RE | Admit: 2022-06-09 | Discharge: 2022-06-09 | Disposition: A | Discharge status: Home / Self Care | Payer: Medicare Other | Source: Ambulatory Visit | Attending: Obstetrics & Gynecology | Admitting: Obstetrics & Gynecology

## 2022-06-09 ENCOUNTER — Ambulatory Visit (HOSPITAL_BASED_OUTPATIENT_CLINIC_OR_DEPARTMENT_OTHER)
Admission: RE | Admit: 2022-06-09 | Discharge: 2022-06-09 | Disposition: A | Discharge status: Home / Self Care | Payer: Medicare Other | Source: Ambulatory Visit | Attending: Allergy and Immunology | Admitting: Allergy and Immunology

## 2022-06-09 DIAGNOSIS — M8589 Other specified disorders of bone density and structure, multiple sites: Secondary | ICD-10-CM | POA: Diagnosis present

## 2022-06-09 DIAGNOSIS — R1012 Left upper quadrant pain: Secondary | ICD-10-CM | POA: Insufficient documentation

## 2022-06-09 MED ORDER — COMIRNATY 30 MCG/0.3ML IM SUSY
PREFILLED_SYRINGE | INTRAMUSCULAR | 0 refills | Status: AC
  Filled 2022-06-09: qty 0.3, 1d supply, fill #0

## 2022-06-15 ENCOUNTER — Telehealth: Payer: Self-pay | Admitting: Allergy and Immunology

## 2022-06-15 NOTE — Telephone Encounter (Signed)
Please refer to Bone Density results.

## 2022-06-15 NOTE — Telephone Encounter (Signed)
Patient called to get the result of her labs.. [phone]

## 2022-06-20 IMAGING — MG MM DIGITAL DIAGNOSTIC UNILAT*R* W/ TOMO W/ CAD
4 series · 4 of 12 positions shown · non-contrast
Comparison: Previous exam(s).

CLINICAL DATA: Possible mass in outer right breast in the
craniocaudal position and posterior central right breast in the
oblique projection on a recent screening mammogram.

EXAM:
DIGITAL DIAGNOSTIC UNILATERAL RIGHT MAMMOGRAM WITH TOMO AND CAD

[R CC synth-2D]
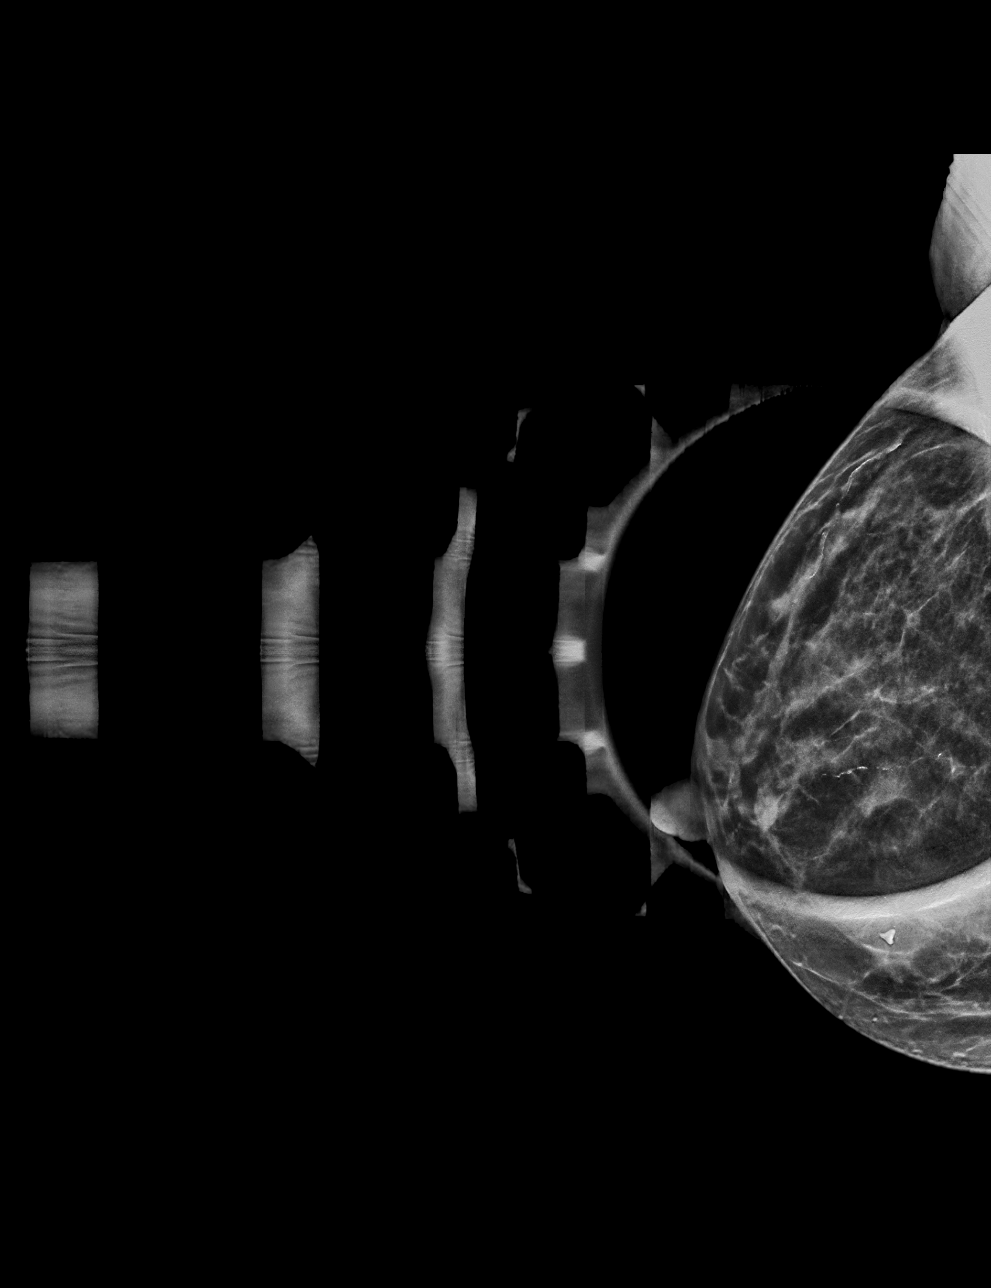

[R MLO synth-2D]
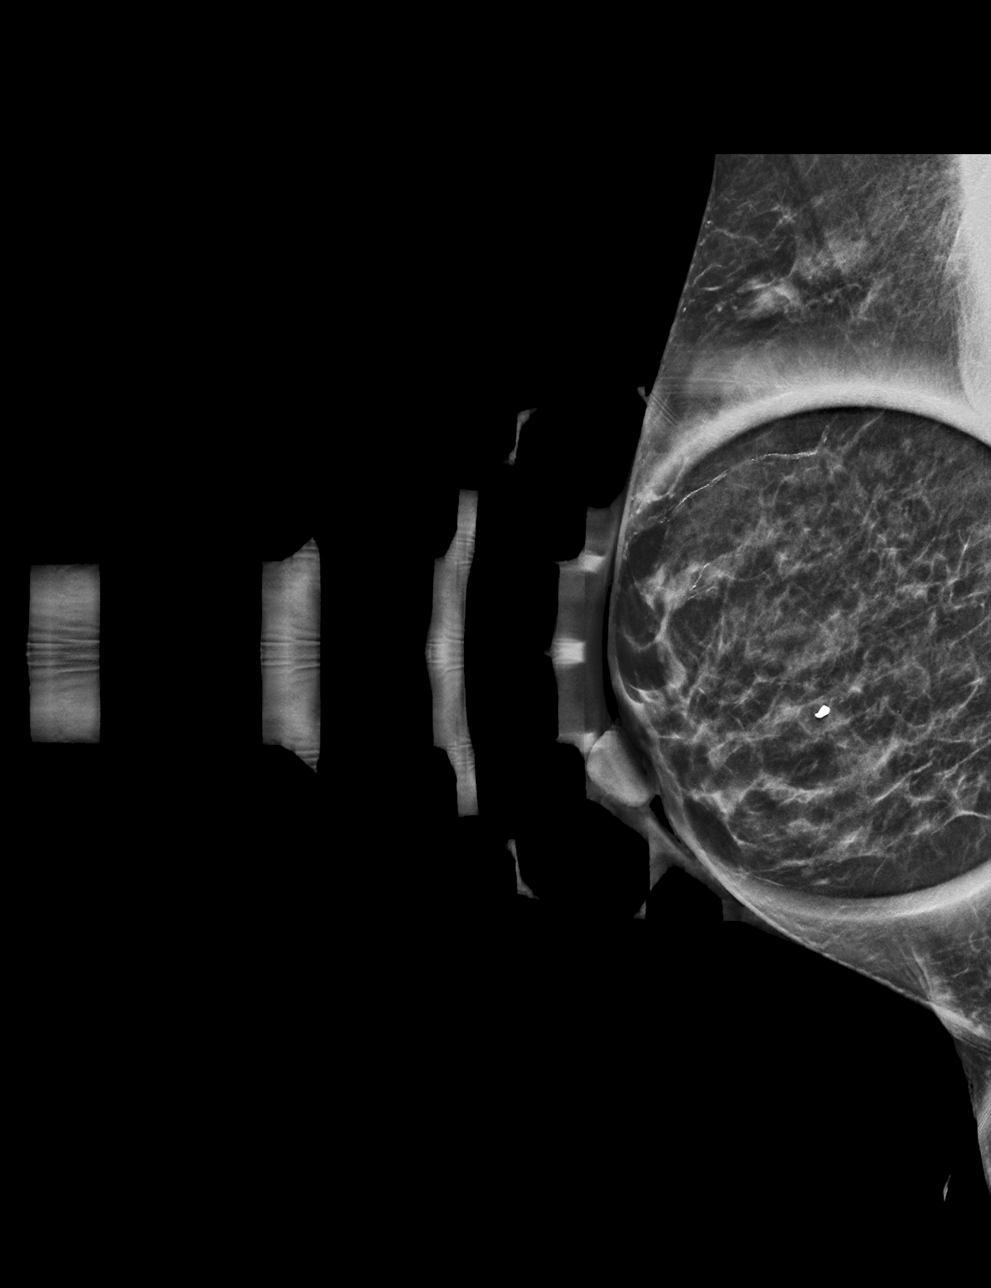

[R MLO tomo · tomo slice 22/43.0]
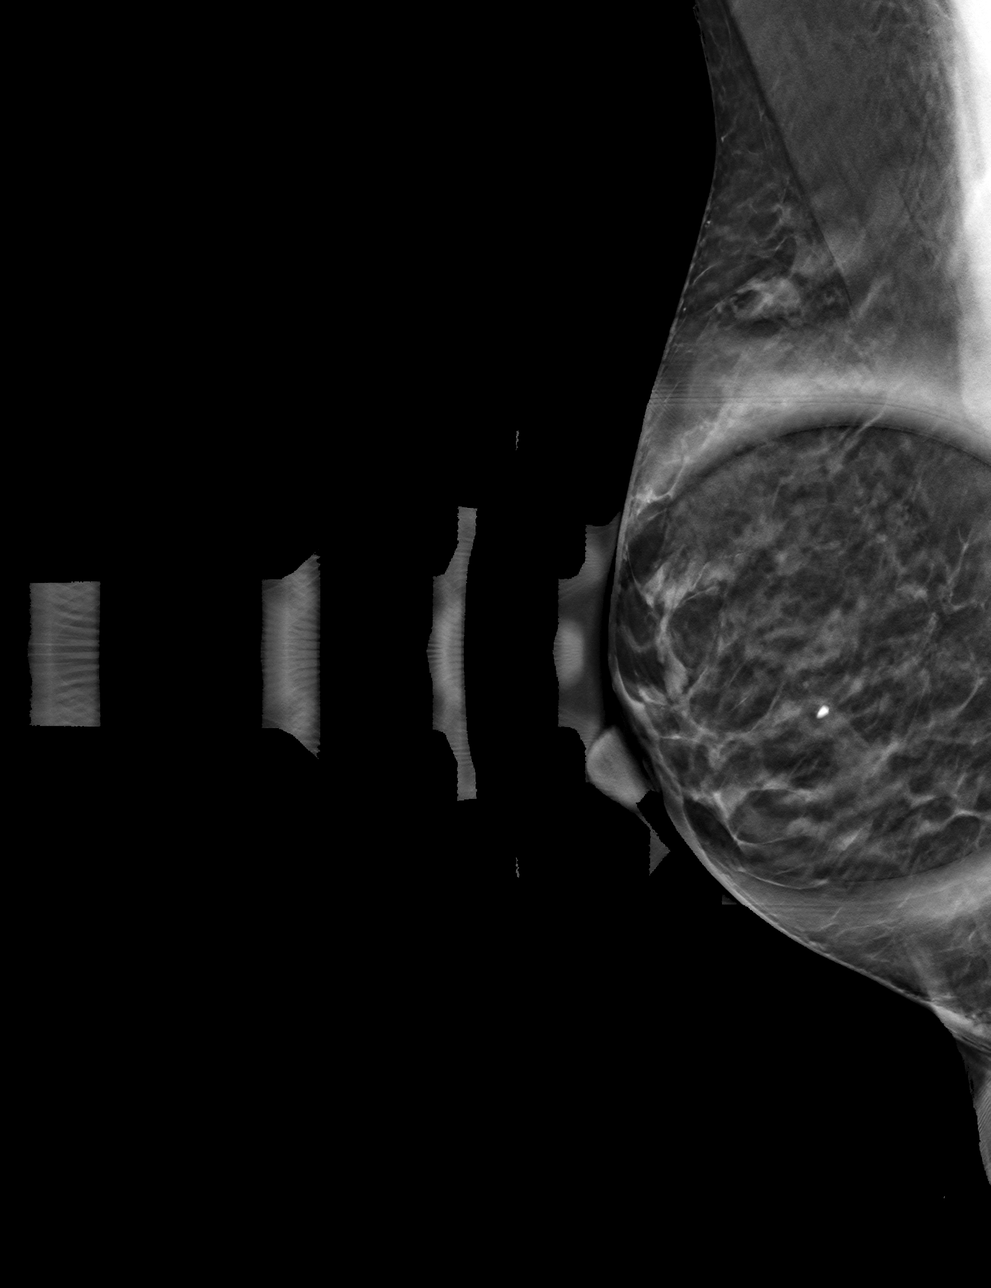

[R CC tomo · tomo slice 22/43.0]
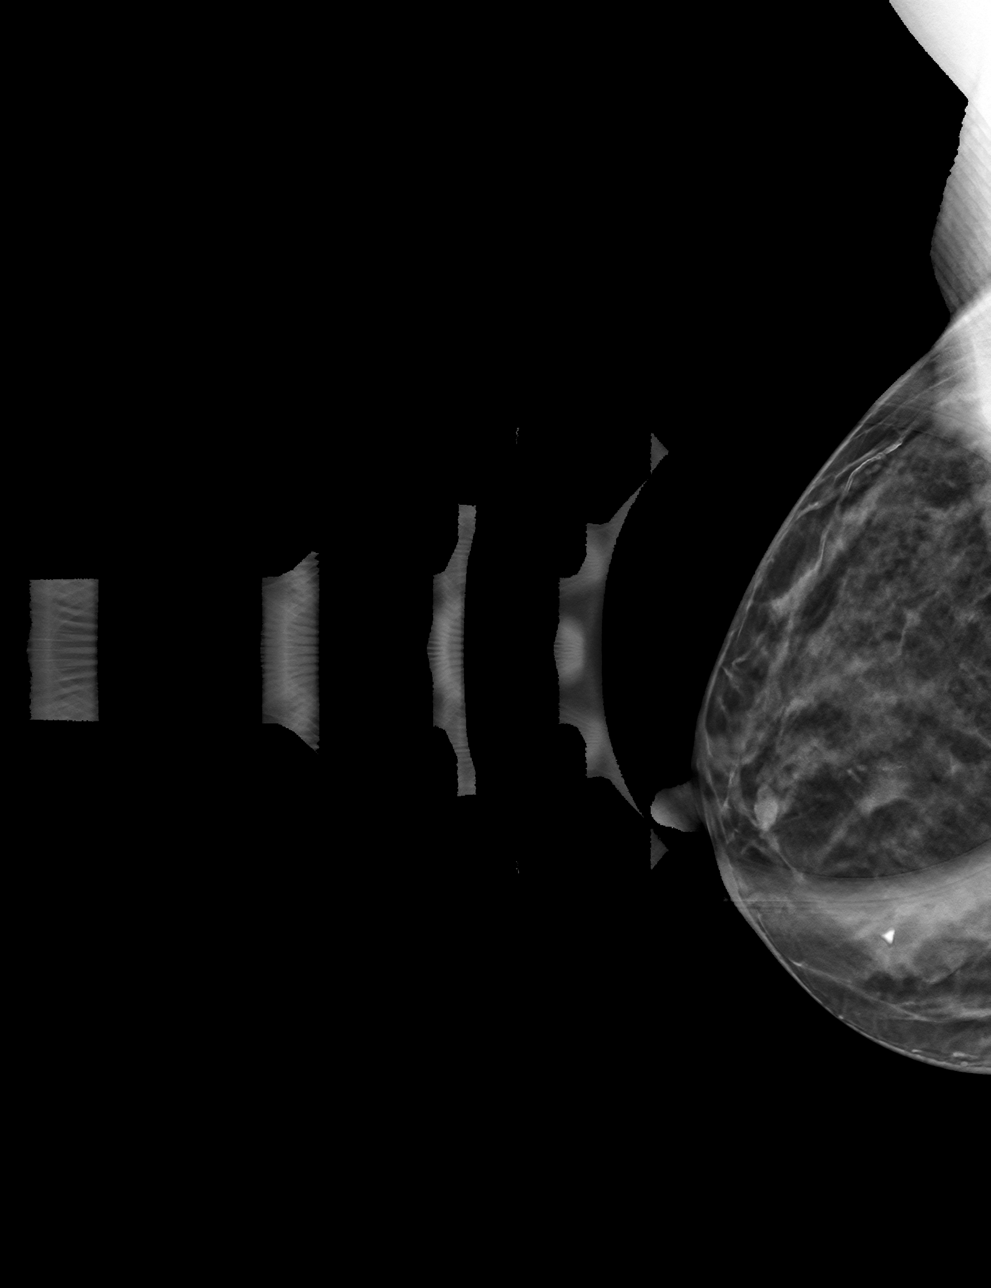

[4 of 12 positions shown; findings below may reference images not displayed]

ACR Breast Density Category c: The breast tissue is heterogeneously
dense, which may obscure small masses.
FINDINGS: 3D tomographic and 2D generated spot compression images of the right
breast demonstrate normal appearing fibroglandular tissue at the
locations of recently suspected asymmetry, unchanged compared
previous examinations.

Mammographic images were processed with CAD.
IMPRESSION: No evidence of malignancy. The recently suspected right breast
asymmetry was close apposition of normal breast tissue.

RECOMMENDATION:
Bilateral screening mammogram in 1 year.

I have discussed the findings and recommendations with the patient.
If applicable, a reminder letter will be sent to the patient
regarding the next appointment.

BI-RADS CATEGORY  1: Negative.

## 2022-06-30 ENCOUNTER — Other Ambulatory Visit (HOSPITAL_COMMUNITY): Payer: Self-pay

## 2022-06-30 DIAGNOSIS — M79661 Pain in right lower leg: Secondary | ICD-10-CM

## 2022-07-01 ENCOUNTER — Ambulatory Visit (HOSPITAL_COMMUNITY)
Admission: RE | Admit: 2022-07-01 | Discharge: 2022-07-01 | Disposition: A | Discharge status: Home / Self Care | Payer: Medicare Other | Source: Ambulatory Visit | Attending: Vascular Surgery | Admitting: Vascular Surgery

## 2022-07-01 DIAGNOSIS — M79661 Pain in right lower leg: Secondary | ICD-10-CM | POA: Diagnosis not present

## 2022-07-02 DIAGNOSIS — R499 Unspecified voice and resonance disorder: Secondary | ICD-10-CM | POA: Insufficient documentation

## 2022-07-02 DIAGNOSIS — M79661 Pain in right lower leg: Secondary | ICD-10-CM

## 2022-07-02 HISTORY — DX: Pain in right lower leg: M79.661

## 2022-08-04 ENCOUNTER — Other Ambulatory Visit (HOSPITAL_BASED_OUTPATIENT_CLINIC_OR_DEPARTMENT_OTHER): Payer: Self-pay

## 2022-08-04 MED ORDER — AREXVY 120 MCG/0.5ML IM SUSR
INTRAMUSCULAR | 0 refills | Status: AC
  Filled 2022-08-04: qty 0.5, 1d supply, fill #0

## 2022-08-05 ENCOUNTER — Other Ambulatory Visit (HOSPITAL_BASED_OUTPATIENT_CLINIC_OR_DEPARTMENT_OTHER): Payer: Self-pay

## 2022-08-26 ENCOUNTER — Other Ambulatory Visit (HOSPITAL_COMMUNITY): Payer: Self-pay

## 2022-09-07 ENCOUNTER — Other Ambulatory Visit: Payer: Self-pay

## 2022-09-07 ENCOUNTER — Ambulatory Visit (INDEPENDENT_AMBULATORY_CARE_PROVIDER_SITE_OTHER): Payer: Medicare Other | Admitting: Allergy and Immunology

## 2022-09-07 ENCOUNTER — Encounter: Payer: Self-pay | Admitting: Allergy and Immunology

## 2022-09-07 VITALS — BP 124/80 | HR 67 | Temp 97.2°F | Resp 14 | Ht 63.25 in | Wt 125.4 lb

## 2022-09-07 DIAGNOSIS — J3089 Other allergic rhinitis: Secondary | ICD-10-CM | POA: Diagnosis not present

## 2022-09-07 DIAGNOSIS — J4540 Moderate persistent asthma, uncomplicated: Secondary | ICD-10-CM

## 2022-09-07 DIAGNOSIS — T63481A Toxic effect of venom of other arthropod, accidental (unintentional), initial encounter: Secondary | ICD-10-CM

## 2022-09-07 DIAGNOSIS — M8589 Other specified disorders of bone density and structure, multiple sites: Secondary | ICD-10-CM | POA: Diagnosis not present

## 2022-09-07 DIAGNOSIS — K219 Gastro-esophageal reflux disease without esophagitis: Secondary | ICD-10-CM | POA: Diagnosis not present

## 2022-09-07 MED ORDER — FLUTICASONE PROPIONATE 50 MCG/ACT NA SUSP: 2.0000 | Freq: Every day | NASAL | 1 refills | Status: DC

## 2022-09-07 MED ORDER — BREZTRI AEROSPHERE 160-9-4.8 MCG/ACT IN AERO: 2.0000 | INHALATION_SPRAY | Freq: Two times a day (BID) | RESPIRATORY_TRACT | 1 refills | Status: DC

## 2022-09-07 MED ORDER — EPINEPHRINE 0.3 MG/0.3ML IJ SOAJ: 0.3000 mg | INTRAMUSCULAR | 1 refills | Status: DC | PRN

## 2022-09-07 MED ORDER — FAMOTIDINE 40 MG PO TABS: 40.0000 mg | ORAL_TABLET | Freq: Every evening | ORAL | 1 refills | Status: DC

## 2022-09-07 MED ORDER — CETIRIZINE HCL 10 MG PO TABS
10.0000 mg | ORAL_TABLET | Freq: Every day | ORAL | 1 refills | Status: DC | PRN
  Filled 2023-06-02: qty 90, 90d supply, fill #0
  Filled 2023-09-02: qty 90, 90d supply, fill #1

## 2022-09-07 MED ORDER — ALBUTEROL SULFATE HFA 108 (90 BASE) MCG/ACT IN AERS: 2.0000 | INHALATION_SPRAY | RESPIRATORY_TRACT | 1 refills | Status: DC | PRN

## 2022-09-07 MED ORDER — ALENDRONATE SODIUM 70 MG PO TABS
70.0000 mg | ORAL_TABLET | ORAL | 1 refills | Status: DC
  Filled 2023-06-10: qty 12, 84d supply, fill #0
  Filled 2023-09-02: qty 12, 84d supply, fill #1

## 2022-09-07 MED ORDER — SPACER/AERO-HOLDING CHAMBERS DEVI: 1.0000 | 1 refills | Status: AC

## 2022-09-07 MED ORDER — DEXLANSOPRAZOLE 60 MG PO CPDR: 60.0000 mg | DELAYED_RELEASE_CAPSULE | Freq: Every morning | ORAL | 5 refills | Status: DC

## 2022-09-07 NOTE — Progress Notes (Unsigned)
[facility] [place] [facility] [place]   Follow-up Note  Referring Provider: No ref. provider found Primary Provider: Berneta Levins, DO Date of Office Visit: 09/07/2022  Subjective:   Beverly [name] (DOB: 03/09/1946) is a 77 y.o. female who returns to the [facility] on 09/07/2022 in re-evaluation of the following:  HPI: Beverly [name] returns to this clinic in reevaluation of asthma, allergic rhinitis, LPR, and history of large local reaction to hymenoptera venom exposure.  I last saw her in this clinic 02 March 2022.  Since I have seen her in this clinic she contracted influenza in October 2023 and was sick for 2 weeks but fortunately resolved that issue.  Otherwise, she has her asthma and her chronic cough under pretty good control and she rarely if ever uses a short acting bronchodilator and she can exert herself without any problem.  As well, her chronic cough associated with throat clearing is much better while aggressively treating reflux.  She has not had any issues with her nose.  She has obtained flu vaccine, RSV vaccine, COVID-vaccine.  Allergies as of 09/07/2022   No Known Allergies      Medication List    albuterol 108 (90 Base) MCG/ACT inhaler Commonly known as: Ventolin HFA Inhale 2 puffs into the lungs every 4 (four) hours as needed for wheezing or shortness of breath.   Breztri Aerosphere 160-9-4.8 MCG/ACT Aero Generic drug: Budeson-Glycopyrrol-Formoterol Inhale 2 puffs into the lungs in the morning and at bedtime.   calcium carbonate 100 mg/ml Susp Take 1 tablet by mouth daily at 12 noon.   cetirizine 10 MG tablet Commonly known as: ZYRTEC Take 1 tablet (10 mg total) by mouth 2 (two) times daily as needed for allergies (Can take an extra dose during flare ups.).   dexlansoprazole 60 MG capsule Commonly known as: DEXILANT Take 1 capsule (60 mg total) by mouth in the morning.   EPINEPHrine 0.3 mg/0.3 mL Soaj  injection Commonly known as: EPI-PEN Inject 0.3 mg into the muscle as directed.   Estradiol-Norethindrone Acet 0.5-0.1 MG tablet Take 1 tablet by mouth daily.   famotidine 40 MG tablet Commonly known as: PEPCID Take 1 tablet (40 mg total) by mouth at bedtime.   Fluad 0.5 ML Susy Generic drug: Influenza Vac A&B Surf Ant Adj Inject 0.5 mLs into the muscle once. 2018   Fluad Quadrivalent 0.5 ML injection Generic drug: influenza vaccine adjuvanted Inject into the muscle.   fluticasone 50 MCG/ACT nasal spray Commonly known as: FLONASE Place 2 sprays into both nostrils daily.   multivitamin tablet Take 1 tablet by mouth daily.   Pfizer COVID-19 Vac Bivalent injection Generic drug: COVID-19 mRNA bivalent vaccine Therapist, music) Inject into the muscle.   Pfizer-BioNT COVID-19 Vac-TriS Susp injection Generic drug: COVID-19 mRNA Vac-TriS (Pfizer) Inject into the muscle.   Comirnaty syringe Generic drug: COVID-19 mRNA vaccine 2023-2024 Inject into the muscle.   SOLUBLE FIBER/PROBIOTICS PO Take by mouth.   VITAMIN D BOOSTER PO Take 1 tablet by mouth daily at 12 noon.     Past Medical History:  Diagnosis Date   Allergic rhinitis    Allergy    Anemia    Asthma    Cataract    bilateral cataracts removed   Chronic cough    Esophageal reflux    IBS (irritable bowel syndrome)    Osteopenia 03/14/2013   DEXA '@LB'$  02/2013: -1.8    Past Surgical History:  Procedure Laterality Date  bilateral cateracts removed     BREAST LUMPECTOMY Left    FOOT SURGERY     right   LAPAROSCOPIC CHOLECYSTECTOMY  11/2009   right foot bunionectomy     TUBAL LIGATION      Review of systems negative except as noted in HPI / PMHx or noted below:  Review of Systems  Constitutional: Negative.   HENT: Negative.    Eyes: Negative.   Respiratory: Negative.    Cardiovascular: Negative.   Gastrointestinal: Negative.   Genitourinary: Negative.   Musculoskeletal: Negative.   Skin: Negative.    Neurological: Negative.   Endo/Heme/Allergies: Negative.   Psychiatric/Behavioral: Negative.       Objective:   Vitals:   09/07/22 1631  BP: 124/80  Pulse: 67  Resp: 14  Temp: (!) 97.2 F (36.2 C)  SpO2: 98%   Height: 5' 3.25" (160.7 cm)  Weight: 125 lb 6.4 oz (56.9 kg)   Physical Exam Constitutional:      Appearance: She is not diaphoretic.  HENT:     Head: Normocephalic.     Right Ear: Tympanic membrane, ear canal and external ear normal.     Left Ear: Tympanic membrane, ear canal and external ear normal.     Nose: Nose normal. No mucosal edema or rhinorrhea.     Mouth/Throat:     Pharynx: Uvula midline. No oropharyngeal exudate.  Eyes:     Conjunctiva/sclera: Conjunctivae normal.  Neck:     Thyroid: No thyromegaly.     Trachea: Trachea normal. No tracheal tenderness or tracheal deviation.  Cardiovascular:     Rate and Rhythm: Normal rate and regular rhythm.     Heart sounds: Normal heart sounds, S1 normal and S2 normal. No murmur heard. Pulmonary:     Effort: No respiratory distress.     Breath sounds: Normal breath sounds. No stridor. No wheezing or rales.  Lymphadenopathy:     Head:     Right side of head: No tonsillar adenopathy.     Left side of head: No tonsillar adenopathy.     Cervical: No cervical adenopathy.  Skin:    Findings: No erythema or rash.     Nails: There is no clubbing.  Neurological:     Mental Status: She is alert.     Diagnostics:    Spirometry was performed and demonstrated an FEV1 of 1.61 at 81 % of predicted.  Result of a bone densitometry scan obtained 09 June 2022 identified the following:  The BMD measured at DualFemur Neck Right is 0.720 g/cm2 with a T-score of -2.3. This patient is considered to have osteopenia/low bone mass according to [facility]) criteria.   The scan quality is good. Lumbar Spine excluded due to degenerative changes. No FRAX due to estrogen use.   Site Region Measured  Date Measured Age YA BMD Significant CHANGE T-score DualFemur Neck Right 6/09/2022 76.4 -2.3 0.720 g/cm2   DualFemur Total Mean 06/09/2022 76.4 -1.4 0.829 g/cm2   Left Forearm Radius 33% 06/09/2022 76.4 -2.2 0.687 g/cm2  Results of the chest x-ray obtained 08 March 2022 identified the following:  The heart size and mediastinal contours are within normal limits. Both lungs are clear. There is scoliosis of the thoracolumbar spine.    Assessment and Plan:   1. Asthma, moderate persistent, well-controlled   2. Other allergic rhinitis   3. LPRD (laryngopharyngeal reflux disease)   4. Osteopenia of multiple sites   5. 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  Continue Breztri - 2 inhalations 1-2 times per day w/ spacer (empty lungs)    2. Continue Flonase - 1 spray each nostril 1-2 times per day    3. Continue to treat reflux:  A. Dexilant 60 mg 1 time a day in AM   B. Famotidine 40 mg - 1 tablet in evening if needed C. Minimize caffeine, chocolate, alcohol consumption  4. If needed:  A. Zyrtec  B. Albuterol HFA C. Epi-Pen, Benadryl, MD/ER evaluation  5. Start alendronate 70 mg 1 time per week    7. Return to clinic in 6 months or earlier if problem   Aleisa will continue to use anti-inflammatory agents for her airway and and continue to treat her reflux/LPR as noted above.  I am going to start her on alendronate as she is osteopenic/osteoporosis and she is using a inhaled steroid on a daily basis and using a proton pump inhibitor on a daily basis and certainly is at risk for developing clinically significant osteoporosis.  I will see her back in this clinic in 6 months or earlier if there is a problem.   Allena Katz, MD Allergy / Immunology [place]

## 2022-09-07 NOTE — Patient Instructions (Addendum)
  1. Continue Breztri - 2 inhalations 1-2 times per day w/ spacer (empty lungs)    2. Continue Flonase - 1 spray each nostril 1-2 times per day    3. Continue to treat reflux:  A. Dexilant 60 mg 1 time a day in AM   B. Famotidine 40 mg - 1 tablet in evening if needed C. Minimize caffeine, chocolate, alcohol consumption  4. If needed:  A. Zyrtec  B. Albuterol HFA C. Epi-Pen, Benadryl, MD/ER evaluation  5. Start alendronate 70 mg 1 time per week    7. Return to clinic in 6 months or earlier if problem

## 2022-09-08 ENCOUNTER — Encounter: Payer: Self-pay | Admitting: Allergy and Immunology

## 2022-10-25 ENCOUNTER — Other Ambulatory Visit (HOSPITAL_BASED_OUTPATIENT_CLINIC_OR_DEPARTMENT_OTHER): Payer: Self-pay

## 2022-10-25 MED ORDER — COMIRNATY 30 MCG/0.3ML IM SUSY
0.3000 mL | PREFILLED_SYRINGE | INTRAMUSCULAR | 0 refills | Status: AC
  Filled 2022-10-25 – 2022-10-27 (×2): qty 0.3, 1d supply, fill #0

## 2022-10-27 ENCOUNTER — Other Ambulatory Visit (HOSPITAL_BASED_OUTPATIENT_CLINIC_OR_DEPARTMENT_OTHER): Payer: Self-pay

## 2022-12-28 IMAGING — DX DG ABDOMEN 1V
2 series · 2 of 2 positions shown · non-contrast
Comparison: None.

CLINICAL DATA: Epigastric abdominal pain.

EXAM:
ABDOMEN - 1 VIEW

[abdomen kub (1 of 2)]
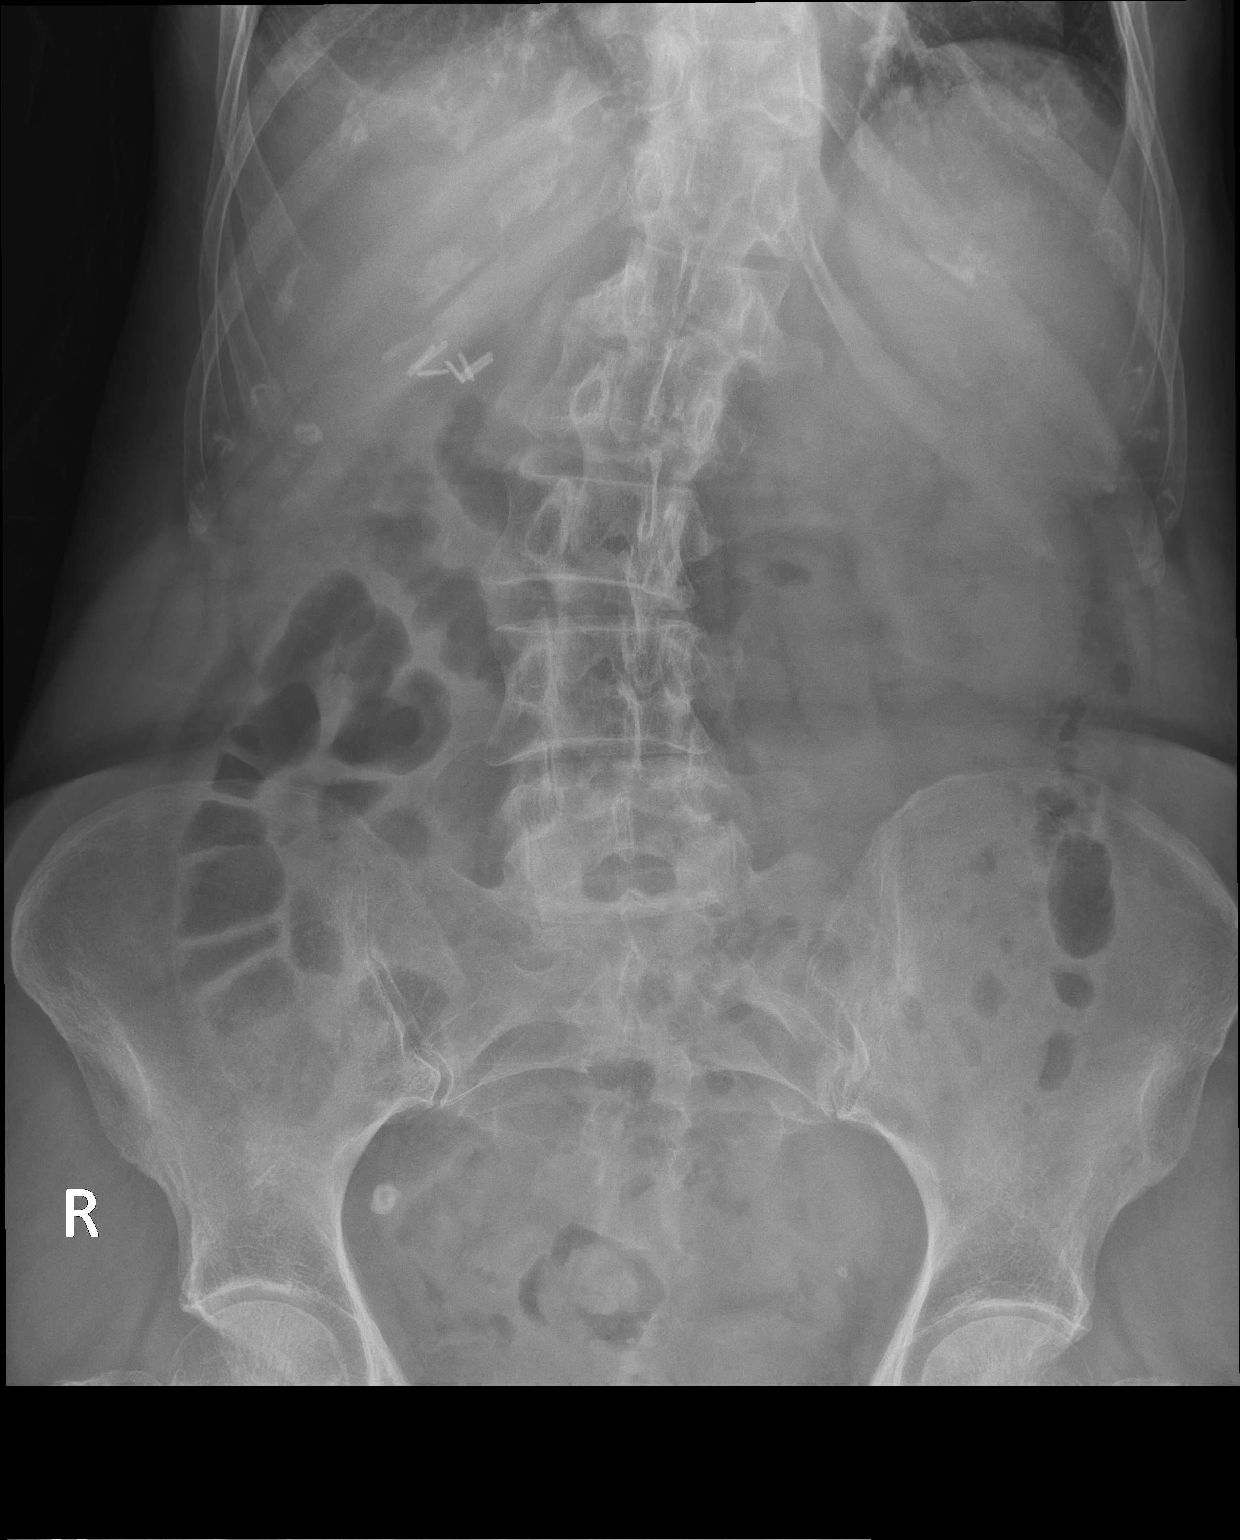

[abdomen kub (2 of 2)]
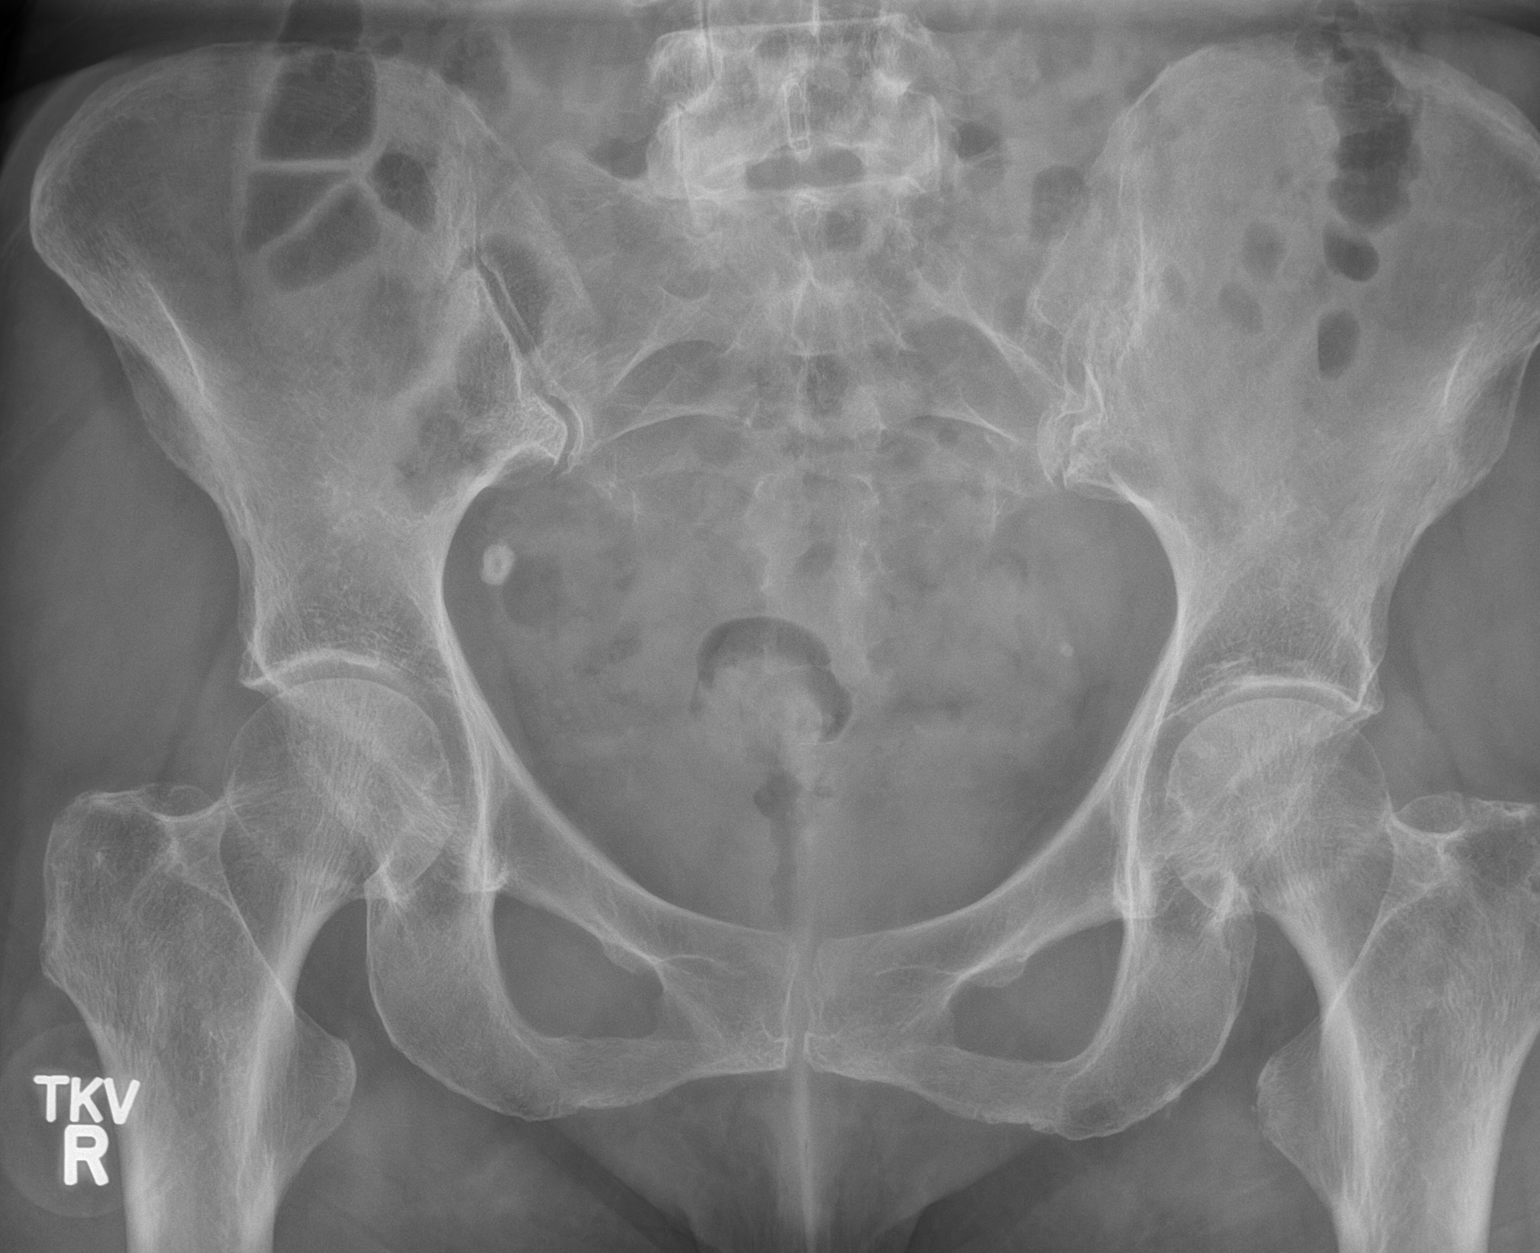

[2 of 2 positions shown; findings below may reference images not displayed]

FINDINGS: The bowel gas pattern is normal. No radio-opaque calculi or other
significant radiographic abnormality are seen.
IMPRESSION: Negative.

## 2023-01-06 DIAGNOSIS — L989 Disorder of the skin and subcutaneous tissue, unspecified: Secondary | ICD-10-CM | POA: Insufficient documentation

## 2023-01-10 NOTE — Progress Notes (Unsigned)
[name] [address] [place] [address] Phone: [phone] Subjective:   [name], am serving as a scribe for Dr. Antoine Primas.  I'm seeing this patient by the request  of:  Combs, Prince Solian, DO  CC: Neck pain  [id number]  Beverly [name] is a 77 y.o. female coming in with complaint of thoracic spine pain.  Last seen in July 2021. Getting therapeutic massage every 2 weeks which has been helpful. Pain worse with horse back riding but has pain throughout the day to some degree. Notes some neck stiffness from time to time.    Known degenerative disc disease.  Patient's last x-rays in 2021 showed severe arthritic changes at several levels most severe though at the C5-C6 level.    Past Medical History:  Diagnosis Date   Allergic rhinitis    Allergy    Anemia    Asthma    Cataract    bilateral cataracts removed   Chronic cough    Esophageal reflux    IBS (irritable bowel syndrome)    Osteopenia 03/14/2013   DEXA @LB  02/2013: -1.8   Past Surgical History:  Procedure Laterality Date   bilateral cateracts removed     BREAST LUMPECTOMY Left    FOOT SURGERY     right   LAPAROSCOPIC CHOLECYSTECTOMY  11/2009   right foot bunionectomy     TUBAL LIGATION     Social History   Socioeconomic History   Marital status: Married    Spouse name: Not on file   Number of children: Not on file   Years of education: Not on file   Highest education level: Not on file  Occupational History   Not on file  Tobacco Use   Smoking status: Never   Smokeless tobacco: Never  Vaping Use   Vaping Use: Never used  Substance and Sexual Activity   Alcohol use: Yes    Alcohol/week: 7.0 standard drinks of alcohol    Types: 7 Glasses of wine per week   Drug use: No   Sexual activity: Yes  Other Topics Concern   Not on file  Social History Narrative   Not on file   Social Determinants of Health   Financial Resource Strain: Not on file   Food Insecurity: Not on file  Transportation Needs: Not on file  Physical Activity: Not on file  Stress: Not on file  Social Connections: Not on file   No Known Allergies Family History  Problem Relation Age of Onset   Pancreatic cancer Mother    Heart disease Father    Hypertension Brother    Hyperlipidemia Brother    Colon cancer Maternal Grandfather 77   Esophageal cancer Neg Hx    Rectal cancer Neg Hx    Stomach cancer Neg Hx    Allergic rhinitis Neg Hx    Angioedema Neg Hx    Asthma Neg Hx    Atopy Neg Hx    Eczema Neg Hx    Urticaria Neg Hx    Immunodeficiency Neg Hx     Current Outpatient Medications (Endocrine & Metabolic):    alendronate (FOSAMAX) 70 MG tablet, Take 1 tablet (70 mg total) by mouth once a week. Take with a full glass of water on an empty stomach.   Estradiol-Norethindrone Acet 0.5-0.1 MG tablet, Take 1 tablet by mouth daily.  Current Outpatient Medications (Cardiovascular):    EPINEPHrine 0.3 mg/0.3 mL IJ SOAJ injection, Inject 0.3 mg into the  muscle as needed for anaphylaxis.  Current Outpatient Medications (Respiratory):    albuterol (VENTOLIN HFA) 108 (90 Base) MCG/ACT inhaler, Inhale 2 puffs into the lungs every 4 (four) hours as needed for wheezing or shortness of breath.   Budeson-Glycopyrrol-Formoterol (BREZTRI AEROSPHERE) 160-9-4.8 MCG/ACT AERO, Inhale 2 puffs into the lungs in the morning and at bedtime.   cetirizine (ZYRTEC) 10 MG tablet, Take 1 tablet (10 mg total) by mouth daily as needed for allergies (Can take an extra dose during flare ups.).   fluticasone (FLONASE) 50 MCG/ACT nasal spray, Place 2 sprays into both nostrils daily.    Current Outpatient Medications (Other):    calcium carbonate 100 mg/ml SUSP*, Take 1 tablet by mouth daily at 12 noon.   COVID-19 mRNA bivalent vaccine, Pfizer, (PFIZER COVID-19 VAC BIVALENT) injection, Inject into the muscle.   COVID-19 mRNA Vac-TriS, Pfizer, (PFIZER-BIONT COVID-19 VAC-TRIS) SUSP  injection, Inject into the muscle.   COVID-19 mRNA vaccine 2023-2024 (COMIRNATY) syringe, Inject into the muscle.   COVID-19 mRNA vaccine 2023-2024 (COMIRNATY) syringe, Inject 0.3 mLs into the muscle.   dexlansoprazole (DEXILANT) 60 MG capsule, Take 1 capsule (60 mg total) by mouth in the morning.   famotidine (PEPCID) 40 MG tablet, Take 1 tablet (40 mg total) by mouth at bedtime.   Influenza Vac A&B Surf Ant Adj (FLUAD) 0.5 ML SUSY, Inject 0.5 mLs into the muscle once. 2018   influenza vaccine adjuvanted (FLUAD) 0.5 ML injection, Inject into the muscle.   Multiple Vitamin (MULTIVITAMIN) tablet, Take 1 tablet by mouth daily.   Nutritional Supplements (VITAMIN D BOOSTER PO), Take 1 tablet by mouth daily at 12 noon.   Probiotic Product (SOLUBLE FIBER/PROBIOTICS PO), Take by mouth.   RSV vaccine recomb adjuvanted (AREXVY) 120 MCG/0.5ML injection, Inject into the muscle.   Spacer/Aero-Holding Chambers DEVI, 1 Device by Does not apply route as directed. * These medications belong to multiple therapeutic classes and are listed under each applicable group.   Reviewed prior external information including notes and imaging from  primary care provider As well as notes that were available from care everywhere and other healthcare systems.  Past medical history, social, surgical and family history all reviewed in electronic medical record.  No pertanent information unless stated regarding to the chief complaint.   Review of Systems:  No headache, visual changes, nausea, vomiting, diarrhea, constipation, dizziness, abdominal pain, skin rash, fevers, chills, night sweats, weight loss, swollen lymph nodes, body aches, joint swelling, chest pain, shortness of breath, mood changes. POSITIVE muscle aches  Objective  Blood pressure 102/78, pulse 74, height 5\' 3"  (1.6 m), weight 122 lb (55.3 kg), SpO2 92 %.   General: No apparent distress alert and oriented x3 mood and affect normal, dressed appropriately.   HEENT: Pupils equal, extraocular movements intact  Respiratory: Patient's speak in full sentences and does not appear short of breath  Cardiovascular: No lo loss of lordosis noted.  Significant tightness noted in the thoracolumbar junction noted as well on the right greater than the patient does still have tightness noted in the left parascapular area as well as the left low back.  Very mild increase in tightness noted with FABER test bilaterally.  Negative internal range of motion in the groin pain.   [id number]; 15 additional minutes spent for Therapeutic exercises as stated in above notes.  This included exercises focusing on stretching, strengthening, with significant focus on eccentric aspects.   Long term goals include an improvement in range of motion, strength, endurance as well as  avoiding reinjury. Patient's frequency would include in 1-2 times a day, 3-5 times a week for a duration of 6-12 weeks. Low back exercises that included:  Pelvic tilt/bracing instruction to focus on control of the pelvic girdle and lower abdominal muscles  Glute strengthening exercises, focusing on proper firing of the glutes without engaging the low back muscles Proper stretching techniques for maximum relief for the hamstrings, hip flexors, low back and some rotation where tolerated   Proper technique shown and discussed handout in great detail with ATC.  All questions were discussed and answered.     Impression and Recommendations:    The above documentation has been reviewed and is accurate and complete [name], DO

## 2023-01-11 ENCOUNTER — Encounter: Payer: Self-pay | Admitting: Family Medicine

## 2023-01-11 ENCOUNTER — Ambulatory Visit (INDEPENDENT_AMBULATORY_CARE_PROVIDER_SITE_OTHER): Payer: Medicare Other | Admitting: Family Medicine

## 2023-01-11 ENCOUNTER — Ambulatory Visit (INDEPENDENT_AMBULATORY_CARE_PROVIDER_SITE_OTHER): Payer: Medicare Other

## 2023-01-11 VITALS — BP 102/78 | HR 74 | Ht 63.0 in | Wt 122.0 lb

## 2023-01-11 DIAGNOSIS — M533 Sacrococcygeal disorders, not elsewhere classified: Secondary | ICD-10-CM | POA: Diagnosis not present

## 2023-01-11 DIAGNOSIS — M545 Low back pain, unspecified: Secondary | ICD-10-CM | POA: Diagnosis not present

## 2023-01-11 DIAGNOSIS — M546 Pain in thoracic spine: Secondary | ICD-10-CM

## 2023-01-11 DIAGNOSIS — M542 Cervicalgia: Secondary | ICD-10-CM

## 2023-01-11 NOTE — Patient Instructions (Signed)
Xray today Lumbar spine exercises PT [facility] Calcium 2 pills daily See me in 7-8 weeks

## 2023-01-11 NOTE — Assessment & Plan Note (Addendum)
Patient does have some discomfort noted as well.  Discussed about hip abductor strengthening.  Has been over 3 years seen.  More osteopenia recently.  Increase activity slowly.  Start formal physical therapy.  Will get x-rays to further evaluate.  Follow-up with me again 6 to 8 weeks

## 2023-02-03 ENCOUNTER — Other Ambulatory Visit: Payer: Self-pay | Admitting: Allergy and Immunology

## 2023-02-09 ENCOUNTER — Ambulatory Visit: Payer: BLUE CROSS/BLUE SHIELD | Admitting: Family Medicine

## 2023-02-23 ENCOUNTER — Ambulatory Visit (HOSPITAL_BASED_OUTPATIENT_CLINIC_OR_DEPARTMENT_OTHER): Payer: Medicare Other | Admitting: Physical Therapy

## 2023-03-02 ENCOUNTER — Ambulatory Visit: Payer: Medicare Other | Admitting: Family Medicine

## 2023-03-03 ENCOUNTER — Encounter (HOSPITAL_BASED_OUTPATIENT_CLINIC_OR_DEPARTMENT_OTHER): Payer: Self-pay | Admitting: Physical Therapy

## 2023-03-03 ENCOUNTER — Ambulatory Visit (HOSPITAL_BASED_OUTPATIENT_CLINIC_OR_DEPARTMENT_OTHER): Payer: Medicare Other | Attending: Family Medicine | Admitting: Physical Therapy

## 2023-03-03 ENCOUNTER — Other Ambulatory Visit: Payer: Self-pay

## 2023-03-03 DIAGNOSIS — M546 Pain in thoracic spine: Secondary | ICD-10-CM | POA: Diagnosis present

## 2023-03-03 DIAGNOSIS — R293 Abnormal posture: Secondary | ICD-10-CM | POA: Diagnosis present

## 2023-03-03 DIAGNOSIS — M5459 Other low back pain: Secondary | ICD-10-CM | POA: Diagnosis present

## 2023-03-03 DIAGNOSIS — M545 Low back pain, unspecified: Secondary | ICD-10-CM | POA: Insufficient documentation

## 2023-03-03 NOTE — Therapy (Signed)
OUTPATIENT PHYSICAL THERAPY THORACOLUMBAR EVALUATION   Patient Name: Beverly [name] MRN: [id number] DOB:Dec 11, 1945, 77 y.o., female Today's Date: 03/03/2023  END OF SESSION:  PT End of Session - 03/03/23 1158     Visit Number 1    Number of Visits 8    Date for PT Re-Evaluation 04/28/23    PT Start Time 1015    PT Stop Time 1058    PT Time Calculation (min) 43 min    Activity Tolerance Patient tolerated treatment well    Behavior During Therapy [facility] for tasks assessed/performed             Past Medical History:  Diagnosis Date   Allergic rhinitis    Allergy    Anemia    Asthma    Cataract    bilateral cataracts removed   Chronic cough    Esophageal reflux    IBS (irritable bowel syndrome)    Osteopenia 03/14/2013   DEXA @[facility]  02/2013: -1.8   Past Surgical History:  Procedure Laterality Date   bilateral cateracts removed     BREAST LUMPECTOMY Left    FOOT SURGERY     right   LAPAROSCOPIC CHOLECYSTECTOMY  11/2009   right foot bunionectomy     TUBAL LIGATION     Patient Active Problem List   Diagnosis Date Noted   Degenerative disc disease, cervical 12/19/2019   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   Nonallopathic lesion of lumbosacral region 03/13/2014   Nonallopathic lesion of sacral region 03/13/2014   Nonallopathic lesion of thoracic region 03/13/2014   Osteopenia 03/14/2013   Postmenopausal disorder 02/15/2013   Lumbago 05/23/2012   Irritable bowel syndrome 04/08/2008   ALLERGIC RHINITIS 06/29/2007   Esophageal reflux 06/29/2007   Cough 06/29/2007    PCP: Dr Elesa Massed  REFERRING PROVIDER: Dr Antoine Primas DO   REFERRING DIAG:  Diagnosis  M54.50 (ICD-10-CM) - Lumbar spine pain    Rationale for Evaluation and Treatment: Rehabilitation  THERAPY DIAG:  Other low back pain  Pain in thoracic spine  Abnormal posture  ONSET DATE:   SUBJECTIVE:                                                                                                                                                                                            SUBJECTIVE STATEMENT: Patient has a long history of low back pain and midthoracic pain.  She was diagnosed with scoliosis.  She also has a history of osteopenia.  She reports over the past few years she is lost 2 inches of height.  She has increased pain when she stands for too long and  with rotational activity.  She has increased pain rotating to the right.  She feels like the pain can be more in her left rib cage at times and also in her right lower back.  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 Yes: NPRS scale: 3-4 at worst/10 Pain location: Can be left or right depending on the day Pain description: Aching Aggravating factors: turning to the left and right  Relieving factors: tylenol   PRECAUTIONS: None  RED FLAGS: None   WEIGHT BEARING RESTRICTIONS: No  FALLS:  Has patient fallen in last 6 months? No  LIVING ENVIRONMENT: Has steps but no issues OCCUPATION:  Consulting civil engineer and painting   Hobbies:  Yoga,  Painting Horseback riding         PLOF: Independent  PATIENT GOALS: To develop a plan to reduce pain and prevent further increase in pain  NEXT MD VISIT: When she is done PT for a few weeks  OBJECTIVE:   DIAGNOSTIC FINDINGS:    PATIENT SURVEYS:  FOTO    SCREENING FOR RED FLAGS: Bowel or bladder incontinence: No Spinal tumors: No Cauda equina syndrome: No Compression fracture: No Abdominal aneurysm: No  COGNITION: Overall cognitive status: Within functional limits for tasks assessed     SENSATION: WFL   POSTURE: Good posture  PALPATION: Tender to palpation in the right QL and left lower mid thoracic paraspinals and into her left rib cage  LUMBAR ROM:   AROM eval  Flexion   Extension   Right lateral  flexion   Left lateral flexion   Right rotation Pain in right shoulder blade   Left rotation Less pain but pain in the left shoulder blade   (Blank rows = not tested)   LOWER EXTREMITY MMT:    MMT Right eval Left eval  Hip flexion 17.5 15.5  Hip extension    Hip abduction 16.2 16.3  Hip adduction    Hip internal rotation    Hip external rotation    Knee flexion    Knee extension 23.9 19.2  Ankle dorsiflexion    Ankle plantarflexion    Ankle inversion    Ankle eversion     (Blank rows = not tested)  GAIT: No gait deviations;    TODAY'S TREATMENT:                                                                                                                              DATE: Access Code: [email or web address] URL: [email or web address] Date: 03/03/2023 Prepared by: Lorayne Bender  Exercises - Sidelying Open Book Thoracic Lumbar Rotation and Extension  - 1 x daily - 7 x weekly - 1 sets - 10 reps - Theracane Over Shoulder  - 1 x daily - 7 x weekly - 3 sets - 1-42min  hold - Standing Bilateral  Low Shoulder Row with Anchored Resistance  - 1 x daily - 7 x weekly - 3 sets - 10 reps - Standing Glute Med Mobilization with Small Ball on Wall  - 1 x daily - 7 x weekly - 3 sets - 10 reps   Reviewed spots for self soft tissue mobilization   PATIENT EDUCATION:  Education details: HEP, symptom management,  Person educated: Patient Education method: Explanation, Demonstration, Tactile cues, Verbal cues, and Handouts Education comprehension: verbalized understanding, returned demonstration, verbal cues required, tactile cues required, and needs further education  HOME EXERCISE PROGRAM: Access Code: [email or web address] URL: [email or web address] Date: 03/03/2023 Prepared by: Lorayne Bender  Exercises - Sidelying Open Book Thoracic Lumbar Rotation and Extension  - 1 x daily - 7 x weekly - 1 sets - 10 reps - Theracane Over Shoulder  - 1 x daily - 7 x weekly - 3 sets - 1-63min   hold - Standing Bilateral Low Shoulder Row with Anchored Resistance  - 1 x daily - 7 x weekly - 3 sets - 10 reps - Standing Glute Med Mobilization with Small Ball on Wall  - 1 x daily - 7 x weekly - 3 sets - 10 reps  ASSESSMENT:  CLINICAL IMPRESSION: Patient is a 77 year-old female with a history of scoliosis and low back pain.  She has increased pain with right rotation.  She has good flexion and mild pain with end range extension but she has very good motion with extension.  She has mild weakness in her legs.  She has tenderness to palpation in left mid thoracic paraspinals and right QL.  She is very active.  She enjoys horseback riding, yoga, and teaches art classes.  She would benefit from skilled therapy to continue to be able to perform these activities with less pain and prevent further increase in lumbar spine pain.  OBJECTIVE IMPAIRMENTS: decreased activity tolerance, decreased ROM, increased fascial restrictions, and pain.   ACTIVITY LIMITATIONS: sitting, standing, and locomotion level  PARTICIPATION LIMITATIONS: community activity and occupation  PERSONAL FACTORS: 1-2 comorbidities: osteopenia scoliosis   are also affecting patient's functional outcome.   REHAB POTENTIAL: Excellent  CLINICAL DECISION MAKING: Stable/uncomplicated  EVALUATION COMPLEXITY: Low   GOALS: Goals reviewed with patient? Yes  SHORT TERM GOALS: Target date: 03/31/2023    Patient will perform right rotation without increased pain Baseline: Goal status: INITIAL  2.  Patient will increase gross bilateral lower extremity strength by 5 pounds Baseline:  Goal status: INITIAL  3.  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  Patient will be independent with complete exercise program to  continue to improve posture Baseline:  Goal status: INITIAL  3.  Patient will perform rotational activity in her home without increased pain in order to perform ADLs Baseline:  Goal status: INITIAL  PLAN:  PT FREQUENCY: 1-2x/week  PT DURATION: 8 weeks  PLANNED INTERVENTIONS: Therapeutic exercises, Therapeutic activity, Neuromuscular re-education, Balance training, Gait training, Patient/Family education, Self Care, Joint mobilization, Stair training, DME instructions, Aquatic Therapy, Dry Needling, Electrical stimulation, Cryotherapy, Moist heat, Taping, Manual therapy, and Re-evaluation.   PLAN FOR NEXT SESSION:  Consider right side standing doorway stretch for scoliosis, continue with posterior chain strengthening including shoulder extension and bilateral external rotation.  Patient does have osteopenia but can consider loading  exercises.  Patient does not enjoy the machines.  She is a gym member but does not plan on doing the machines we can still potentially work some loading exercises but things like squats if that something she is interested in.  Patient mostly likes to do classes.  Consider manual therapy if pain persists for right QL and left paraspinals.   Dessie Coma, PT 03/03/2023, 1:35 PM

## 2023-03-07 ENCOUNTER — Other Ambulatory Visit (HOSPITAL_BASED_OUTPATIENT_CLINIC_OR_DEPARTMENT_OTHER): Payer: Self-pay

## 2023-03-07 DIAGNOSIS — N390 Urinary tract infection, site not specified: Secondary | ICD-10-CM

## 2023-03-07 HISTORY — DX: Urinary tract infection, site not specified: N39.0

## 2023-03-07 MED ORDER — NITROFURANTOIN MONOHYD MACRO 100 MG PO CAPS
100.0000 mg | ORAL_CAPSULE | Freq: Two times a day (BID) | ORAL | 0 refills | Status: DC
  Filled 2023-03-07: qty 10, 5d supply, fill #0

## 2023-03-08 ENCOUNTER — Other Ambulatory Visit: Payer: Self-pay

## 2023-03-08 ENCOUNTER — Ambulatory Visit (INDEPENDENT_AMBULATORY_CARE_PROVIDER_SITE_OTHER): Payer: Medicare Other | Admitting: Allergy and Immunology

## 2023-03-08 ENCOUNTER — Encounter: Payer: Self-pay | Admitting: Allergy and Immunology

## 2023-03-08 VITALS — BP 102/72 | HR 69 | Temp 98.6°F

## 2023-03-08 DIAGNOSIS — J3089 Other allergic rhinitis: Secondary | ICD-10-CM | POA: Diagnosis not present

## 2023-03-08 DIAGNOSIS — J4540 Moderate persistent asthma, uncomplicated: Secondary | ICD-10-CM | POA: Diagnosis not present

## 2023-03-08 DIAGNOSIS — K219 Gastro-esophageal reflux disease without esophagitis: Secondary | ICD-10-CM | POA: Diagnosis not present

## 2023-03-08 DIAGNOSIS — M8589 Other specified disorders of bone density and structure, multiple sites: Secondary | ICD-10-CM

## 2023-03-08 DIAGNOSIS — R233 Spontaneous ecchymoses: Secondary | ICD-10-CM

## 2023-03-08 MED ORDER — BREZTRI AEROSPHERE 160-9-4.8 MCG/ACT IN AERO
2.0000 | INHALATION_SPRAY | Freq: Two times a day (BID) | RESPIRATORY_TRACT | 1 refills | Status: DC
  Filled 2023-06-29: qty 32.1, 90d supply, fill #0
  Filled 2023-06-29: qty 10.7, 30d supply, fill #0

## 2023-03-08 MED ORDER — FLUTICASONE PROPIONATE 50 MCG/ACT NA SUSP
2.0000 | Freq: Every day | NASAL | 1 refills | Status: DC
  Filled 2023-08-15: qty 48, 90d supply, fill #0

## 2023-03-08 MED ORDER — ALBUTEROL SULFATE HFA 108 (90 BASE) MCG/ACT IN AERS: 2.0000 | INHALATION_SPRAY | RESPIRATORY_TRACT | 1 refills | Status: DC | PRN

## 2023-03-08 MED ORDER — FAMOTIDINE 40 MG PO TABS: 40.0000 mg | ORAL_TABLET | Freq: Every evening | ORAL | 1 refills | Status: DC

## 2023-03-08 NOTE — Progress Notes (Unsigned)
[place] - [place] - [place] - [place] - [place]   Follow-up Note  Referring Provider: Theodis Shove, * Primary Provider: Theodis Shove, DO Date of Office Visit: 03/08/2023  Subjective:   Beverly [name] (DOB: May 15, 1946) is a 77 y.o. 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  I last saw her in this clinic 07 September 2022.  She has really done pretty well since her last visit, not requiring a systemic steroid or an antibiotic for any type of airway issue, and rare requirement for short acting bronchodilator, and very little limitation on ability to exercise, while she consistently uses Breztri mostly 1 time per day and Flonase on a daily basis.  And she had very little problems with reflux and very little problems with her throat while using Dexilant on a consistent basis.  On occasion she will use some famotidine in the evening especially if she is going to be eating a spicy pizza.  She has been tolerating her alendronate once a week without any problem.  She also notices that she bruises easily on her hands and arms and this has been going on for over a year.  She is not on any blood thinners and she does not take any aspirin.  Allergies as of 03/08/2023   No Known Allergies      Medication List    albuterol 108 (90 Base) MCG/ACT inhaler Commonly known as: Ventolin HFA Inhale 2 puffs into the lungs every 4 (four) hours as needed for wheezing or shortness of breath.   alendronate 70 MG tablet Commonly known as: Fosamax Take 1 tablet (70 mg total) by mouth once a week. Take with a full glass of water on an empty stomach.   Breztri Aerosphere 160-9-4.8 MCG/ACT Aero Generic drug: Budeson-Glycopyrrol-Formoterol Inhale 2 puffs into  the lungs in the morning and at bedtime.   calcium carbonate 100 mg/ml Susp Take 1 tablet by mouth daily at 12 noon.   cetirizine 10 MG tablet Commonly known as: ZYRTEC Take 1 tablet (10 mg total) by mouth daily as needed for allergies (Can take an extra dose during flare ups.).   dexlansoprazole 60 MG capsule Commonly known as: DEXILANT TAKE 1 CAPSULE (60 MG TOTAL) BY MOUTH IN THE MORNING   EPINEPHrine 0.3 mg/0.3 mL Soaj injection Commonly known as: EPI-PEN Inject 0.3 mg into the muscle as needed for anaphylaxis.   Estradiol-Norethindrone Acet 0.5-0.1 MG tablet Take 1 tablet by mouth every other day.   famotidine 40 MG tablet Commonly known as: PEPCID Take 1 tablet (40 mg total) by mouth at bedtime.   Fluad 0.5 ML injection Generic drug: influenza vaccine adjuvanted Inject 0.5 mLs into the muscle once. 2018   Fluad Quadrivalent 0.5 ML injection Generic drug: influenza vaccine adjuvanted Inject into the muscle.   fluticasone 50 MCG/ACT nasal spray Commonly known as: FLONASE Place 2 sprays into both nostrils daily.   multivitamin tablet Take 1 tablet by mouth daily.   nitrofurantoin (macrocrystal-monohydrate) 100 MG capsule Commonly known as: Macrobid Take 1 capsule (100 mg total) by mouth 2 (two) times daily for UTI   Pfizer COVID-19 Vac Bivalent injection Generic drug: COVID-19 mRNA bivalent vaccine (Pfizer) Inject into the muscle.   Pfizer-BioNT COVID-19 Vac-TriS Susp injection Generic drug: COVID-19 mRNA Vac-TriS Proofreader) Inject  into the muscle.   Comirnaty syringe Generic drug: COVID-19 mRNA vaccine 2023-2024 Inject into the muscle.   Comirnaty syringe Generic drug: COVID-19 mRNA vaccine 2023-2024 Inject 0.3 mLs into the muscle.   SOLUBLE FIBER/PROBIOTICS PO Take by mouth.   Spacer/Aero-Holding [employer] 1 Device by Does not apply route as directed.   VITAMIN D BOOSTER PO Take 1 tablet by mouth daily at 12 noon.    Past Medical History:   Diagnosis Date   Allergic rhinitis    Allergy    Anemia    Asthma    Cataract    bilateral cataracts removed   Chronic cough    Esophageal reflux    IBS (irritable bowel syndrome)    Osteopenia 03/14/2013   DEXA @[facility]  02/2013: -1.8    Past Surgical History:  Procedure Laterality Date   bilateral cateracts removed     BREAST LUMPECTOMY Left    FOOT SURGERY     right   LAPAROSCOPIC CHOLECYSTECTOMY  11/2009   right foot bunionectomy     TUBAL LIGATION      Review of systems negative except as noted in HPI / PMHx or noted below:  Review of Systems  Constitutional: Negative.   HENT: Negative.    Eyes: Negative.   Respiratory: Negative.    Cardiovascular: Negative.   Gastrointestinal: Negative.   Genitourinary: Negative.   Musculoskeletal: Negative.   Skin: Negative.   Neurological: Negative.   Endo/Heme/Allergies: Negative.   Psychiatric/Behavioral: Negative.       Objective:   Vitals:   03/08/23 1544  BP: 102/72  Pulse: 69  Temp: 98.6 F (37 C)  SpO2: 96%          Physical Exam Constitutional:      Appearance: She is not diaphoretic.  HENT:     Head: Normocephalic.     Right Ear: Tympanic membrane, ear canal and external ear normal.     Left Ear: Tympanic membrane, ear canal and external ear normal.     Nose: Nose normal. No mucosal edema or rhinorrhea.     Mouth/Throat:     Pharynx: Uvula midline. No oropharyngeal exudate.  Eyes:     Conjunctiva/sclera: Conjunctivae normal.  Neck:     Thyroid: No thyromegaly.     Trachea: Trachea normal. No tracheal tenderness or tracheal deviation.  Cardiovascular:     Rate and Rhythm: Normal rate and regular rhythm.     Heart sounds: Normal heart sounds, S1 normal and S2 normal. No murmur heard. Pulmonary:     Effort: No respiratory distress.     Breath sounds: Normal breath sounds. No stridor. No wheezing or rales.  Lymphadenopathy:     Head:     Right side of head: No tonsillar adenopathy.     Left side of  head: No tonsillar adenopathy.     Cervical: No cervical adenopathy.  Skin:    Findings: No erythema or rash.     Nails: There is no clubbing.  Neurological:     Mental Status: She is alert.     Diagnostics:    Spirometry was performed and demonstrated an FEV1 of 1.80 at 93 % of predicted.  The patient had an Asthma Control Test with the following results: ACT Total Score: 20.    Assessment and Plan:   1. Asthma, moderate persistent, well-controlled   2. Other allergic rhinitis   3. LPRD (laryngopharyngeal reflux disease)   4. Osteopenia of multiple sites   5. Easy bruising  1. Continue Breztri - 2 inhalations 1-2 times per day w/ spacer (empty lungs)    2. Continue Flonase - 1 spray each nostril 1-2 times per day    3. Continue to treat reflux:  A. Dexilant 60 mg 1 time a day in AM   B. Famotidine 40 mg - 1 tablet in evening if needed C. Minimize caffeine, chocolate, alcohol consumption  4. Continue to treat osteopenia / osteoporosis:   A. Alendronate 70 mg 1 time per week  5. If needed:  A. Zyrtec  B. Albuterol HFA C. Epi-Pen, Benadryl, MD/ER evaluation  6. Plan for fall flu vaccine    7. Return to clinic in 6 months or earlier if problem   8. Blood - CBC w/d, CMP, PT, PTT for bruising  Camily appears to be doing very well at this point in time regarding both her upper and lower airway and her reflux disease on her current plan which includes anti-inflammatory agents for airway and therapy directed against reflux.  She will continue on this plan and she will also continue to address her bone demineralization issue with alendronate and we will see her back in this clinic in 6 months or earlier if there is a problem.  We will order some screening labs in investigation of her bruising.  Laurette Schimke, MD Allergy / Immunology [facility]

## 2023-03-08 NOTE — Patient Instructions (Addendum)
  1. Continue Breztri - 2 inhalations 1-2 times per day w/ spacer (empty lungs)    2. Continue Flonase - 1 spray each nostril 1-2 times per day    3. Continue to treat reflux:  A. Dexilant 60 mg 1 time a day in AM   B. Famotidine 40 mg - 1 tablet in evening if needed C. Minimize caffeine, chocolate, alcohol consumption  4. Continue to treat osteopenia / osteoporosis:   A. Alendronate 70 mg 1 time per week  5. If needed:  A. Zyrtec  B. Albuterol HFA C. Epi-Pen, Benadryl, MD/ER evaluation  6. Plan for fall flu vaccine    7. Return to clinic in 6 months or earlier if problem   8. Blood - CBC w/d, CMP, PT, PTT for bruising

## 2023-03-09 ENCOUNTER — Ambulatory Visit (HOSPITAL_BASED_OUTPATIENT_CLINIC_OR_DEPARTMENT_OTHER): Payer: Medicare Other | Admitting: Physical Therapy

## 2023-03-09 ENCOUNTER — Other Ambulatory Visit: Payer: Self-pay

## 2023-03-09 ENCOUNTER — Encounter: Payer: Self-pay | Admitting: Allergy and Immunology

## 2023-03-09 ENCOUNTER — Encounter (HOSPITAL_BASED_OUTPATIENT_CLINIC_OR_DEPARTMENT_OTHER): Payer: Self-pay | Admitting: Physical Therapy

## 2023-03-09 DIAGNOSIS — M5459 Other low back pain: Secondary | ICD-10-CM | POA: Diagnosis not present

## 2023-03-09 DIAGNOSIS — M546 Pain in thoracic spine: Secondary | ICD-10-CM

## 2023-03-09 DIAGNOSIS — R233 Spontaneous ecchymoses: Secondary | ICD-10-CM

## 2023-03-09 DIAGNOSIS — R293 Abnormal posture: Secondary | ICD-10-CM

## 2023-03-09 NOTE — Therapy (Signed)
OUTPATIENT PHYSICAL THERAPY THORACOLUMBAR EVALUATION   Patient Name: Beverly [name] MRN: [id number] DOB:02-21-1946, 77 y.o., female Today's Date: 03/09/2023  END OF SESSION:  PT End of Session - 03/09/23 0806     Visit Number 2    Number of Visits 8    Date for PT Re-Evaluation 04/28/23    Authorization Type Medicare    PT Start Time 0806    PT Stop Time 0850    PT Time Calculation (min) 44 min    Activity Tolerance Patient tolerated treatment well    Behavior During Therapy [facility] for tasks assessed/performed             Past Medical History:  Diagnosis Date   Allergic rhinitis    Allergy    Anemia    Asthma    Cataract    bilateral cataracts removed   Chronic cough    Esophageal reflux    IBS (irritable bowel syndrome)    Osteopenia 03/14/2013   DEXA @LB  02/2013: -1.8   Past Surgical History:  Procedure Laterality Date   bilateral cateracts removed     BREAST LUMPECTOMY Left    FOOT SURGERY     right   LAPAROSCOPIC CHOLECYSTECTOMY  11/2009   right foot bunionectomy     TUBAL LIGATION     Patient Active Problem List   Diagnosis Date Noted   Degenerative disc disease, cervical 12/19/2019   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   Nonallopathic lesion of lumbosacral region 03/13/2014   Nonallopathic lesion of sacral region 03/13/2014   Nonallopathic lesion of thoracic region 03/13/2014   Osteopenia 03/14/2013   Postmenopausal disorder 02/15/2013   Lumbago 05/23/2012   Irritable bowel syndrome 04/08/2008   ALLERGIC RHINITIS 06/29/2007   Esophageal reflux 06/29/2007   Cough 06/29/2007    PCP: Dr Elesa Massed  REFERRING PROVIDER: Dr Antoine Primas DO   REFERRING DIAG:  Diagnosis  M54.50 (ICD-10-CM) - Lumbar spine pain    Rationale for Evaluation and Treatment: Rehabilitation  THERAPY DIAG:  Other low back pain  Pain in thoracic spine  Abnormal posture  ONSET DATE:   SUBJECTIVE:                                                                                                                                                                                            SUBJECTIVE STATEMENT: Patient states the back has not been her biggest problem lately. UTI now and on antibiotics. HEP went well. Open book exercise feels good and did before in the past.   EVAL: Patient has a long history of low back pain and midthoracic pain.  She  was diagnosed with scoliosis.  She also has a history of osteopenia.  She reports over the past few years she is lost 2 inches of height.  She has increased pain when she stands for too long and with rotational activity.  She has increased pain rotating to the right.  She feels like the pain can be more in her left rib cage at times and also in her right lower back.  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 Yes: NPRS scale: 3/10 Pain location: Can be left or right depending on the day Pain description: discomfort, stiffness Aggravating factors: turning to the left and right  Relieving factors: tylenol   PRECAUTIONS: None  RED FLAGS: None   WEIGHT BEARING RESTRICTIONS: No  FALLS:  Has patient fallen in last 6 months? No  LIVING ENVIRONMENT: Has steps but no issues OCCUPATION:  Consulting civil engineer and painting   Hobbies:  Yoga,  Painting Horseback riding         PLOF: Independent  PATIENT GOALS: To develop a plan to reduce pain and prevent further increase in pain  NEXT MD VISIT: When she is done PT for a few weeks  OBJECTIVE:   DIAGNOSTIC FINDINGS:    PATIENT SURVEYS:  FOTO    SCREENING FOR RED FLAGS: Bowel or bladder incontinence: No Spinal tumors: No Cauda equina syndrome: No Compression fracture: No Abdominal aneurysm: No  COGNITION: Overall cognitive status: Within functional limits for tasks  assessed     SENSATION: WFL   POSTURE: Good posture  PALPATION: Tender to palpation in the right QL and left lower mid thoracic paraspinals and into her left rib cage  LUMBAR ROM:   AROM eval  Flexion   Extension   Right lateral flexion   Left lateral flexion   Right rotation Pain in right shoulder blade   Left rotation Less pain but pain in the left shoulder blade   (Blank rows = not tested)   LOWER EXTREMITY MMT:    MMT Right eval Left eval  Hip flexion 17.5 15.5  Hip extension    Hip abduction 16.2 16.3  Hip adduction    Hip internal rotation    Hip external rotation    Knee flexion    Knee extension 23.9 19.2  Ankle dorsiflexion    Ankle plantarflexion    Ankle inversion    Ankle eversion     (Blank rows = not tested)  GAIT: No gait deviations;    TODAY'S TREATMENT:                                                                                                                              DATE:  03/09/23 Sidelying open book  10 x 10 second holds bilateral Standing lateral doorway stretch 3 x 20 second holds Standing  row GTB 2 x 10  Scap retraction with GH ER GTB 2 x 10  Thoracic extension over chair 5 x 10 second holds  03/03/2023 - Sidelying Open Book Thoracic Lumbar Rotation and Extension  - 1 x daily - 7 x weekly - 1 sets - 10 reps - Theracane Over Shoulder  - 1 x daily - 7 x weekly - 3 sets - 1-21min  hold - Standing Bilateral Low Shoulder Row with Anchored Resistance  - 1 x daily - 7 x weekly - 3 sets - 10 reps - Standing Glute Med Mobilization with Small Ball on Wall  - 1 x daily - 7 x weekly - 3 sets - 10 reps   Reviewed spots for self soft tissue mobilization   PATIENT EDUCATION:  Education details: HEP, symptom management, 03/09/23: HEP, weightbearing exercises and resistance training for bone density Person educated: Patient Education method: Explanation, Demonstration, Tactile cues, Verbal cues, and Handouts Education comprehension:  verbalized understanding, returned demonstration, verbal cues required, tactile cues required, and needs further education  HOME EXERCISE PROGRAM: Access Code: [email or web address] URL: [email or web address]  03/09/23- Lateral Shift Correction at Wall  - 1 x daily - 7 x weekly - 3 reps - 30 second hold - Shoulder extension with resistance - Neutral  - 1 x daily - 7 x weekly - 2 sets - 10 reps - Shoulder External Rotation and Scapular Retraction with Resistance  - 1 x daily - 7 x weekly - 2 sets - 10 reps  Date: 03/03/2023 - Sidelying Open Book Thoracic Lumbar Rotation and Extension  - 1 x daily - 7 x weekly - 1 sets - 10 reps - Theracane Over Shoulder  - 1 x daily - 7 x weekly - 3 sets - 1-68min  hold - Standing Bilateral Low Shoulder Row with Anchored Resistance  - 1 x daily - 7 x weekly - 3 sets - 10 reps - Standing Glute Med Mobilization with Small Ball on Wall  - 1 x daily - 7 x weekly - 3 sets - 10 reps  ASSESSMENT:  CLINICAL IMPRESSION: Continued with spinal mobility which is tolerated well. Began additional postural strengthening which is completed with good mechanics following initial cueing. Instructed on cervical retraction with resisted exercises for improved postural and alignment. Patient will continue to benefit from physical therapy in order to improve function and reduce impairment.   OBJECTIVE IMPAIRMENTS: decreased activity tolerance, decreased ROM, increased fascial restrictions, and pain.   ACTIVITY LIMITATIONS: sitting, standing, and locomotion level  PARTICIPATION LIMITATIONS: community activity and occupation  PERSONAL FACTORS: 1-2 comorbidities: osteopenia scoliosis   are also affecting patient's functional outcome.   REHAB POTENTIAL: Excellent  CLINICAL DECISION MAKING: Stable/uncomplicated  EVALUATION COMPLEXITY: Low   GOALS: Goals reviewed with patient? Yes  SHORT TERM GOALS: Target date: 03/31/2023    Patient will perform right rotation without  increased pain Baseline: Goal status: INITIAL  2.  Patient will increase gross bilateral lower extremity strength by 5 pounds Baseline:  Goal status: INITIAL  3.  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  Patient will be independent with complete exercise program to continue to improve posture Baseline:  Goal status: INITIAL  3.  Patient will perform rotational activity in her home without increased pain in order to perform  ADLs Baseline:  Goal status: INITIAL  PLAN:  PT FREQUENCY: 1-2x/week  PT DURATION: 8 weeks  PLANNED INTERVENTIONS: Therapeutic exercises, Therapeutic activity, Neuromuscular re-education, Balance training, Gait training, Patient/Family education, Self Care, Joint mobilization, Stair training, DME instructions, Aquatic Therapy, Dry Needling, Electrical stimulation, Cryotherapy, Moist heat, Taping, Manual therapy, and Re-evaluation.   PLAN FOR NEXT SESSION:  Consider right side standing doorway stretch for scoliosis, continue with posterior chain strengthening including shoulder extension and bilateral external rotation.  Patient does have osteopenia but can consider loading exercises.  Patient does not enjoy the machines.  She is a gym member but does not plan on doing the machines we can still potentially work some loading exercises but things like squats if that something she is interested in.  Patient mostly likes to do classes.  Consider manual therapy if pain persists for right QL and left paraspinals.   Reola Mosher Zamari Bonsall, PT 03/09/2023, 8:52 AM

## 2023-03-10 ENCOUNTER — Other Ambulatory Visit (HOSPITAL_BASED_OUTPATIENT_CLINIC_OR_DEPARTMENT_OTHER): Payer: Self-pay

## 2023-03-10 MED ORDER — SULFAMETHOXAZOLE-TRIMETHOPRIM 800-160 MG PO TABS
1.0000 | ORAL_TABLET | Freq: Two times a day (BID) | ORAL | 0 refills | Status: DC
  Filled 2023-03-10: qty 10, 5d supply, fill #0

## 2023-03-15 NOTE — Progress Notes (Signed)
[name] [address] [place] [address] Phone: [phone] Subjective:   [name], am serving as a scribe for Dr. Antoine Primas.  I'm seeing this patient by the request  of:  Combs, Prince Solian, DO  CC: Low back pain and neck pain  ONG:[id number]  01/11/2023 Patient does have some discomfort noted as well.  Discussed about hip abductor strengthening.  Has been over 3 years seen.  More osteopenia recently.  Increase activity slowly.  Start formal physical therapy.  Will get x-rays to further evaluate.  Follow-up with me again 6 to 8 weeks   Updated 03/22/2023 Beverly [name] is a 77 y.o. female coming in with complaint of back pain. Patient states hasn't been doing the best. Has been to PT twice. Thus far not really helping. Low R and upper L is the worse. Massage makes the pain better for a few days.       Past Medical History:  Diagnosis Date   Allergic rhinitis    Allergy    Anemia    Asthma    Cataract    bilateral cataracts removed   Chronic cough    Esophageal reflux    IBS (irritable bowel syndrome)    Osteopenia 03/14/2013   DEXA @LB  02/2013: -1.8   Past Surgical History:  Procedure Laterality Date   bilateral cateracts removed     BREAST LUMPECTOMY Left    FOOT SURGERY     right   LAPAROSCOPIC CHOLECYSTECTOMY  11/2009   right foot bunionectomy     TUBAL LIGATION     Social History   Socioeconomic History   Marital status: Married    Spouse name: Not on file   Number of children: Not on file   Years of education: Not on file   Highest education level: Not on file  Occupational History   Not on file  Tobacco Use   Smoking status: Never   Smokeless tobacco: Never  Vaping Use   Vaping status: Never Used  Substance and Sexual Activity   Alcohol use: Yes    Alcohol/week: 7.0 standard drinks of alcohol    Types: 7 Glasses of wine per week   Drug use: No   Sexual activity: Yes  Other Topics Concern   Not  on file  Social History Narrative   Not on file   Social Determinants of Health   Financial Resource Strain: Not on file  Food Insecurity: Not on file  Transportation Needs: Not on file  Physical Activity: Not on file  Stress: Not on file  Social Connections: Not on file   No Known Allergies Family History  Problem Relation Age of Onset   Pancreatic cancer Mother    Heart disease Father    Hypertension Brother    Hyperlipidemia Brother    Colon cancer Maternal Grandfather 59   Esophageal cancer Neg Hx    Rectal cancer Neg Hx    Stomach cancer Neg Hx    Allergic rhinitis Neg Hx    Angioedema Neg Hx    Asthma Neg Hx    Atopy Neg Hx    Eczema Neg Hx    Urticaria Neg Hx    Immunodeficiency Neg Hx     Current Outpatient Medications (Endocrine & Metabolic):    alendronate (FOSAMAX) 70 MG tablet, Take 1 tablet (70 mg total) by mouth once a week. Take with a full glass of water on an empty stomach.   Estradiol-Norethindrone Acet  0.5-0.1 MG tablet, Take 1 tablet by mouth every other day.  Current Outpatient Medications (Cardiovascular):    EPINEPHrine 0.3 mg/0.3 mL IJ SOAJ injection, Inject 0.3 mg into the muscle as needed for anaphylaxis.  Current Outpatient Medications (Respiratory):    albuterol (VENTOLIN HFA) 108 (90 Base) MCG/ACT inhaler, Inhale 2 puffs into the lungs every 4 (four) hours as needed for wheezing or shortness of breath.   Budeson-Glycopyrrol-Formoterol (BREZTRI AEROSPHERE) 160-9-4.8 MCG/ACT AERO, Inhale 2 puffs into the lungs in the morning and at bedtime.   cetirizine (ZYRTEC) 10 MG tablet, Take 1 tablet (10 mg total) by mouth daily as needed for allergies (Can take an extra dose during flare ups.).   fluticasone (FLONASE) 50 MCG/ACT nasal spray, Place 2 sprays into both nostrils daily.    Current Outpatient Medications (Other):    calcium carbonate 100 mg/ml SUSP*, Take 1 tablet by mouth daily at 12 noon. (Patient not taking: Reported on 03/08/2023)    COVID-19 mRNA bivalent vaccine, Pfizer, (PFIZER COVID-19 VAC BIVALENT) injection, Inject into the muscle.   COVID-19 mRNA Vac-TriS, Pfizer, (PFIZER-BIONT COVID-19 VAC-TRIS) SUSP injection, Inject into the muscle.   COVID-19 mRNA vaccine 2023-2024 (COMIRNATY) syringe, Inject into the muscle.   COVID-19 mRNA vaccine 2023-2024 (COMIRNATY) syringe, Inject 0.3 mLs into the muscle.   dexlansoprazole (DEXILANT) 60 MG capsule, TAKE 1 CAPSULE (60 MG TOTAL) BY MOUTH IN THE MORNING   famotidine (PEPCID) 40 MG tablet, Take 1 tablet (40 mg total) by mouth at bedtime.   Influenza Vac A&B Surf Ant Adj (FLUAD) 0.5 ML SUSY, Inject 0.5 mLs into the muscle once. 2018   influenza vaccine adjuvanted (FLUAD) 0.5 ML injection, Inject into the muscle.   Multiple Vitamin (MULTIVITAMIN) tablet, Take 1 tablet by mouth daily.   nitrofurantoin, macrocrystal-monohydrate, (MACROBID) 100 MG capsule, Take 1 capsule (100 mg total) by mouth 2 (two) times daily for UTI   Nutritional Supplements (VITAMIN D BOOSTER PO), Take 1 tablet by mouth daily at 12 noon.   Probiotic Product (SOLUBLE FIBER/PROBIOTICS PO), Take by mouth. (Patient not taking: Reported on 03/08/2023)   RSV vaccine recomb adjuvanted (AREXVY) 120 MCG/0.5ML injection, Inject into the muscle.   Spacer/Aero-Holding Chambers DEVI, 1 Device by Does not apply route as directed.   sulfamethoxazole-trimethoprim (BACTRIM DS) 800-160 MG tablet, Take 1 tablet by mouth twice a day * These medications belong to multiple therapeutic classes and are listed under each applicable group.   Reviewed prior external information including notes and imaging from  primary care provider As well as notes that were available from care everywhere and other healthcare systems.  Past medical history, social, surgical and family history all reviewed in electronic medical record.  No pertanent information unless stated regarding to the chief complaint.   Review of Systems:  No headache, visual  changes, nausea, vomiting, diarrhea, constipation, dizziness, abdominal pain, skin rash, fevers, chills, night sweats, weight loss, swollen lymph nodes, body aches, joint swelling, chest pain, shortness of breath, mood changes. POSITIVE muscle aches  Objective  Blood pressure 118/74, pulse 71, height 5\' 3"  (1.6 m), weight 126 lb (57.2 kg), SpO2 98%.   General: No apparent distress alert and oriented x3 mood and affect normal, dressed appropriately.  HEENT: Pupils equal, extraocular movements intact  Respiratory: Patient's speak in full sentences and does not appear short of breath  Cardiovascular: No lower extremity edema, non tender, no erythema  Patient does have significant scoliosis noted of the back.  Some tenderness noted diffusely but seems to be worse  in the thoracolumbar juncture as well as over the sacroiliac joint.  Osteopathic findings  C6 flexed rotated and side bent left T3 extended rotated and side bent right inhaled third rib L3 flexed rotated and side bent right Sacrum right on right     Impression and Recommendations:     The above documentation has been reviewed and is accurate and complete [name], DO

## 2023-03-16 ENCOUNTER — Ambulatory Visit (HOSPITAL_BASED_OUTPATIENT_CLINIC_OR_DEPARTMENT_OTHER): Payer: Medicare Other | Attending: Family Medicine | Admitting: Physical Therapy

## 2023-03-16 ENCOUNTER — Telehealth (HOSPITAL_BASED_OUTPATIENT_CLINIC_OR_DEPARTMENT_OTHER): Payer: Self-pay | Admitting: Physical Therapy

## 2023-03-16 DIAGNOSIS — R293 Abnormal posture: Secondary | ICD-10-CM | POA: Insufficient documentation

## 2023-03-16 DIAGNOSIS — M5459 Other low back pain: Secondary | ICD-10-CM | POA: Insufficient documentation

## 2023-03-16 DIAGNOSIS — M546 Pain in thoracic spine: Secondary | ICD-10-CM | POA: Insufficient documentation

## 2023-03-16 NOTE — Telephone Encounter (Signed)
Patient no show, left message for patient to make her aware of missed appointment and reminded her of next appointment. Instructed her to call if she would like to return sooner if there was an opening.   11:34 AM, 03/16/23 Wyman Songster PT, DPT Physical Therapist at [facility]

## 2023-03-22 ENCOUNTER — Ambulatory Visit (INDEPENDENT_AMBULATORY_CARE_PROVIDER_SITE_OTHER): Payer: Medicare Other | Admitting: Family Medicine

## 2023-03-22 ENCOUNTER — Encounter: Payer: Self-pay | Admitting: Family Medicine

## 2023-03-22 ENCOUNTER — Ambulatory Visit (HOSPITAL_BASED_OUTPATIENT_CLINIC_OR_DEPARTMENT_OTHER): Payer: Medicare Other | Admitting: Physical Therapy

## 2023-03-22 VITALS — BP 118/74 | HR 71 | Ht 63.0 in | Wt 126.0 lb

## 2023-03-22 DIAGNOSIS — M9902 Segmental and somatic dysfunction of thoracic region: Secondary | ICD-10-CM

## 2023-03-22 DIAGNOSIS — M9903 Segmental and somatic dysfunction of lumbar region: Secondary | ICD-10-CM | POA: Diagnosis not present

## 2023-03-22 DIAGNOSIS — M9901 Segmental and somatic dysfunction of cervical region: Secondary | ICD-10-CM

## 2023-03-22 DIAGNOSIS — M9908 Segmental and somatic dysfunction of rib cage: Secondary | ICD-10-CM

## 2023-03-22 DIAGNOSIS — M999 Biomechanical lesion, unspecified: Secondary | ICD-10-CM | POA: Diagnosis not present

## 2023-03-22 DIAGNOSIS — M533 Sacrococcygeal disorders, not elsewhere classified: Secondary | ICD-10-CM

## 2023-03-22 DIAGNOSIS — M9904 Segmental and somatic dysfunction of sacral region: Secondary | ICD-10-CM

## 2023-03-22 NOTE — Assessment & Plan Note (Signed)
Attempted osteopathic dilation again.  Does have sacroiliac joint dysfunction noted as well as some tenderness to palpation.  We discussed with patient about the possibility of ruling out occult fractures with advanced imaging.  Patient declined that at this moment.  Discussed home exercises and icing regimen.  Increase activity otherwise relatively slowly.  Patient does go to continue to work with formal physical therapy.  Follow-up with me again in 6 to 8 weeks otherwise.

## 2023-03-22 NOTE — Patient Instructions (Signed)
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

## 2023-03-22 NOTE — Assessment & Plan Note (Signed)
   Decision today to treat with OMT was based on Physical Exam  After verbal consent patient was treated with ME, FPR techniques in cervical, thoracic, rib, lumbar and sacral areas, all areas are chronic avoided HVLA secondary to patient's osteopenia  Patient tolerated the procedure well with improvement in symptoms  Patient given exercises, stretches and lifestyle modifications  See medications in patient instructions if given  Patient will follow up in 4-8 weeks

## 2023-03-23 ENCOUNTER — Encounter (HOSPITAL_BASED_OUTPATIENT_CLINIC_OR_DEPARTMENT_OTHER): Payer: Self-pay | Admitting: Physical Therapy

## 2023-03-23 ENCOUNTER — Ambulatory Visit (HOSPITAL_BASED_OUTPATIENT_CLINIC_OR_DEPARTMENT_OTHER): Payer: Medicare Other | Admitting: Physical Therapy

## 2023-03-23 DIAGNOSIS — R293 Abnormal posture: Secondary | ICD-10-CM

## 2023-03-23 DIAGNOSIS — M5459 Other low back pain: Secondary | ICD-10-CM

## 2023-03-23 DIAGNOSIS — M546 Pain in thoracic spine: Secondary | ICD-10-CM

## 2023-03-23 NOTE — Therapy (Signed)
OUTPATIENT PHYSICAL THERAPY TREATMENT   Patient Name: Beverly [name] MRN: [id number] DOB:Aug 12, 1945, 77 y.o., female Today's Date: 03/23/2023  END OF SESSION:  PT End of Session - 03/23/23 0848     Visit Number 3    Number of Visits 8    Date for PT Re-Evaluation 04/28/23    Authorization Type Medicare    PT Start Time 0848    PT Stop Time 0930    PT Time Calculation (min) 42 min    Activity Tolerance Patient tolerated treatment well    Behavior During Therapy [facility] for tasks assessed/performed             Past Medical History:  Diagnosis Date   Allergic rhinitis    Allergy    Anemia    Asthma    Cataract    bilateral cataracts removed   Chronic cough    Esophageal reflux    IBS (irritable bowel syndrome)    Osteopenia 03/14/2013   DEXA @LB  02/2013: -1.8   Past Surgical History:  Procedure Laterality Date   bilateral cateracts removed     BREAST LUMPECTOMY Left    FOOT SURGERY     right   LAPAROSCOPIC CHOLECYSTECTOMY  11/2009   right foot bunionectomy     TUBAL LIGATION     Patient Active Problem List   Diagnosis Date Noted   Degenerative disc disease, cervical 12/19/2019   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  ONSET DATE:   SUBJECTIVE:                                                                                                                                                                                            SUBJECTIVE STATEMENT: Patient states whole back is sore today from back mobilizations from MD yesterday. Felt good after last session.   EVAL: Patient has a long history of low back pain and midthoracic pain.  She was diagnosed with scoliosis.  She also has a history of osteopenia.  She reports over the past few years she is  lost 2 inches of height.  She has increased pain when she stands for too long and with rotational activity.  She has increased pain rotating to the right.  She feels like the pain can be more in her left rib cage at times and also in her right lower back.  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  PERTINENT HISTORY:  Osteopenia, bilateral cataracts; right foot bunionectomy,    PAIN:  Are you having pain? Yes: NPRS scale: 3-4/10 Pain location: Can be left or right depending on the day Pain description: discomfort, stiffness Aggravating factors: turning to the left and right  Relieving factors: tylenol   PRECAUTIONS: None  RED FLAGS: None   WEIGHT BEARING RESTRICTIONS: No  FALLS:  Has patient fallen in last 6 months? No  LIVING ENVIRONMENT: Has steps but no issues OCCUPATION:  Consulting civil engineer and painting   Hobbies:  Yoga,  Painting Horseback riding         PLOF: Independent  PATIENT GOALS: To develop a plan to reduce pain and prevent further increase in pain  NEXT MD VISIT: When she is done PT for a few weeks  OBJECTIVE:   DIAGNOSTIC FINDINGS:    PATIENT SURVEYS:  FOTO    SCREENING FOR RED FLAGS: Bowel or bladder incontinence: No Spinal tumors: No Cauda equina syndrome: No Compression fracture: No Abdominal aneurysm: No  COGNITION: Overall cognitive status: Within functional limits for tasks assessed     SENSATION: WFL   POSTURE: Good posture  PALPATION: Tender to palpation in the right QL and left lower mid thoracic paraspinals  and into her left rib cage  LUMBAR ROM:   AROM eval  Flexion   Extension   Right lateral flexion   Left lateral flexion   Right rotation Pain in right shoulder blade   Left rotation Less pain but pain in the left shoulder blade   (Blank rows = not tested)   LOWER EXTREMITY MMT:    MMT Right eval Left eval  Hip flexion 17.5 15.5  Hip extension    Hip abduction 16.2 16.3  Hip adduction    Hip internal rotation    Hip external rotation    Knee flexion    Knee extension 23.9 19.2  Ankle dorsiflexion    Ankle plantarflexion    Ankle inversion    Ankle eversion     (Blank rows = not tested)  GAIT: No gait deviations;    TODAY'S TREATMENT:                                                                                                                              DATE:  03/23/23 Standing lateral doorway stretch 3 x 20 second holds Seated lateral lean on ball opening R side 10 x 5 second holds Seated ball core strength - HR, march, LAQ, alternating march/shoulder flexion all with TRA activation 1 x 10 each Standing  row GTB 2 x 10  Standing shoulder extension GTB 2 x 10 Standing palof press GTB 1 x 10 bilateral Scap retraction with GH ER GTB 2 x 10  STS 2 x 10   03/09/23 Sidelying open book  10 x 10 second holds bilateral Standing lateral doorway stretch 3 x 20 second holds Standing row GTB 2 x 10  Scap retraction with GH ER GTB 2 x 10  Thoracic extension over chair 5 x 10 second holds  03/03/2023 - Sidelying Open Book Thoracic Lumbar Rotation and Extension  - 1 x daily - 7 x weekly - 1 sets - 10 reps - Theracane Over Shoulder  - 1 x daily - 7 x weekly - 3 sets - 1-64min  hold - Standing Bilateral Low Shoulder Row with Anchored Resistance  - 1 x daily - 7 x weekly - 3 sets - 10 reps - Standing Glute Med Mobilization with Small Ball on Wall  - 1 x daily - 7 x weekly - 3 sets - 10 reps   Reviewed spots for self soft tissue mobilization   PATIENT EDUCATION:   Education details: HEP, symptom management, 03/09/23: HEP, weightbearing exercises and resistance training for bone density Person educated: Patient Education method: Explanation, Demonstration, Tactile cues, Verbal cues, and Handouts Education comprehension: verbalized understanding, returned demonstration, verbal cues required, tactile cues required, and needs further education  HOME EXERCISE PROGRAM: Access Code: [email or web address] URL: [email or web address]  Date: 03/03/2023 - Sidelying Open Book Thoracic Lumbar Rotation and Extension  - 1 x daily - 7 x weekly - 1 sets - 10 reps - Theracane Over Shoulder  - 1 x daily - 7 x weekly - 3 sets - 1-1min  hold - Standing Bilateral Low Shoulder Row with Anchored Resistance  - 1 x daily - 7 x weekly - 3 sets - 10 reps - Standing Glute Med Mobilization with Small Ball on Wall  - 1 x daily - 7 x weekly - 3 sets - 10 reps  03/09/23- Lateral Shift Correction at Wall  - 1 x daily - 7 x weekly - 3 reps - 30 second hold - Shoulder extension with resistance - Neutral  - 1 x daily - 7 x weekly - 2 sets - 10 reps - Shoulder External Rotation and Scapular Retraction with Resistance  - 1 x daily - 7 x weekly - 2 sets - 10 reps  03/23/23- - Swiss Ball March  - 1 x daily - 7 x weekly - 2 sets - 10 reps - Swiss Ball Knee Extension  - 1 x daily - 7 x weekly - 2 sets - 10 reps - Seated March with Opposite Arm Flexion on Swiss Ball  - 1 x daily - 7 x weekly - 2 sets - 10 reps - Shoulder extension with resistance - Neutral  - 1 x daily - 7 x weekly - 2 sets - 10 reps - Standing Anti-Rotation Press with Anchored Resistance  - 1 x daily - 7 x weekly - 2 sets - 10 reps - Sit to Stand with Arms Crossed  - 1 x daily - 7 x weekly - 2 sets - 10 reps  ASSESSMENT:  CLINICAL IMPRESSION: Continued with spinal mobility which is tolerated well with cueing for core activation. Began seated ball core strength progression and patient performs most with unilateral UE support  for balance. Continued with resisted postural strengthening which is performed well. Patient will continue to benefit from physical therapy in order to improve function  and reduce impairment.   OBJECTIVE IMPAIRMENTS: decreased activity tolerance, decreased ROM, increased fascial restrictions, and pain.   ACTIVITY LIMITATIONS: sitting, standing, and locomotion level  PARTICIPATION LIMITATIONS: community activity and occupation  PERSONAL FACTORS: 1-2 comorbidities: osteopenia scoliosis   are also affecting patient's functional outcome.   REHAB POTENTIAL: Excellent  CLINICAL DECISION MAKING: Stable/uncomplicated  EVALUATION COMPLEXITY: Low   GOALS: Goals reviewed with patient? Yes  SHORT TERM GOALS: Target date: 03/31/2023    Patient will perform right rotation without increased pain Baseline: Goal status: INITIAL  2.  Patient will increase gross bilateral lower extremity strength by 5 pounds Baseline:  Goal status: INITIAL  3.  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  Patient will be independent with complete exercise program to continue to improve posture Baseline:  Goal status: INITIAL  3.  Patient will perform rotational activity in her home without increased pain in order to perform ADLs Baseline:  Goal status: INITIAL  PLAN:  PT FREQUENCY: 1-2x/week  PT DURATION: 8 weeks  PLANNED INTERVENTIONS: Therapeutic exercises, Therapeutic activity, Neuromuscular re-education, Balance training, Gait training, Patient/Family education, Self Care, Joint mobilization, Stair training, DME instructions, Aquatic Therapy, Dry Needling, Electrical stimulation, Cryotherapy, Moist heat, Taping, Manual therapy, and Re-evaluation.   PLAN FOR NEXT SESSION:  Consider right  side standing doorway stretch for scoliosis, continue with posterior chain strengthening including shoulder extension and bilateral external rotation.  Patient does have osteopenia but can consider loading exercises.  Patient does not enjoy the machines.  She is a gym member but does not plan on doing the machines we can still potentially work some loading exercises but things like squats if that something she is interested in.  Patient mostly likes to do classes.  Consider manual therapy if pain persists for right QL and left paraspinals.   Reola Mosher Tien Aispuro, PT 03/23/2023, 9:30 AM

## 2023-03-30 ENCOUNTER — Ambulatory Visit (HOSPITAL_BASED_OUTPATIENT_CLINIC_OR_DEPARTMENT_OTHER): Payer: Medicare Other | Admitting: Physical Therapy

## 2023-03-31 ENCOUNTER — Ambulatory Visit (HOSPITAL_BASED_OUTPATIENT_CLINIC_OR_DEPARTMENT_OTHER): Payer: Medicare Other | Admitting: Physical Therapy

## 2023-04-02 ENCOUNTER — Ambulatory Visit (HOSPITAL_BASED_OUTPATIENT_CLINIC_OR_DEPARTMENT_OTHER): Payer: Medicare Other

## 2023-04-02 ENCOUNTER — Encounter (HOSPITAL_BASED_OUTPATIENT_CLINIC_OR_DEPARTMENT_OTHER): Payer: Self-pay

## 2023-04-02 DIAGNOSIS — M5459 Other low back pain: Secondary | ICD-10-CM

## 2023-04-02 DIAGNOSIS — M546 Pain in thoracic spine: Secondary | ICD-10-CM

## 2023-04-02 DIAGNOSIS — R293 Abnormal posture: Secondary | ICD-10-CM

## 2023-04-02 NOTE — Therapy (Signed)
OUTPATIENT PHYSICAL THERAPY TREATMENT   Patient Name: Beverly [name] MRN: [id number] DOB:18-Oct-1945, 77 y.o., female Today's Date: 04/02/2023  END OF SESSION:  PT End of Session - 04/02/23 1105     Visit Number 4    Number of Visits 8    Date for PT Re-Evaluation 04/28/23    Authorization Type Medicare    PT Start Time 1000    PT Stop Time 1045    PT Time Calculation (min) 45 min    Activity Tolerance Patient tolerated treatment well    Behavior During Therapy [facility] for tasks assessed/performed              Past Medical History:  Diagnosis Date   Allergic rhinitis    Allergy    Anemia    Asthma    Cataract    bilateral cataracts removed   Chronic cough    Esophageal reflux    IBS (irritable bowel syndrome)    Osteopenia 03/14/2013   DEXA @LB  02/2013: -1.8   Past Surgical History:  Procedure Laterality Date   bilateral cateracts removed     BREAST LUMPECTOMY Left    FOOT SURGERY     right   LAPAROSCOPIC CHOLECYSTECTOMY  11/2009   right foot bunionectomy     TUBAL LIGATION     Patient Active Problem List   Diagnosis Date Noted   Degenerative disc disease, cervical 12/19/2019   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   Nonallopathic lesion of lumbosacral region 03/13/2014   Nonallopathic lesion of sacral region 03/13/2014   Nonallopathic lesion of thoracic region 03/13/2014   Osteopenia 03/14/2013   Postmenopausal disorder 02/15/2013   Lumbago 05/23/2012   Irritable bowel syndrome 04/08/2008   ALLERGIC RHINITIS 06/29/2007   Esophageal reflux 06/29/2007   Cough 06/29/2007    PCP: Dr Elesa Massed  REFERRING PROVIDER: Dr Antoine Primas DO   REFERRING DIAG:  Diagnosis  M54.50 (ICD-10-CM) - Lumbar spine pain    Rationale for Evaluation and Treatment: Rehabilitation  THERAPY DIAG:  Other low back pain  Pain in thoracic spine  Abnormal posture  ONSET DATE:   SUBJECTIVE:                                                                                                                                                                                            SUBJECTIVE STATEMENT: Patient states whole back is sore today from back mobilizations from MD yesterday. Felt good after last session.   EVAL: Patient has a long history of low back pain and midthoracic pain.  She was diagnosed with scoliosis.  She also has a history of osteopenia.  She reports over the past few years she is lost 2 inches of height.  She has increased pain when she stands for too long and with rotational activity.  She has increased pain rotating to the right.  She feels like the pain can be more in her left rib cage at times and also in her right lower back.  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  PERTINENT HISTORY:  Osteopenia, bilateral cataracts; right foot bunionectomy,    PAIN:  Are you having pain? Yes: NPRS scale: 3-4/10 Pain location: Can be left or right depending on the day Pain description: discomfort, stiffness Aggravating factors: turning to the left and right  Relieving factors: tylenol   PRECAUTIONS: None  RED FLAGS: None   WEIGHT BEARING RESTRICTIONS: No  FALLS:  Has patient fallen in last 6 months? No  LIVING ENVIRONMENT: Has steps but no issues OCCUPATION:  Consulting civil engineer and painting   Hobbies:  Yoga,  Painting Horseback riding         PLOF: Independent  PATIENT GOALS: To develop a plan to reduce pain and prevent further increase in pain  NEXT MD VISIT: When she is done PT for a few weeks  OBJECTIVE:   DIAGNOSTIC FINDINGS:    PATIENT SURVEYS:  FOTO    SCREENING FOR RED FLAGS: Bowel or bladder incontinence: No Spinal tumors: No Cauda equina syndrome: No Compression fracture: No Abdominal aneurysm: No  COGNITION: Overall cognitive status: Within functional limits for tasks assessed     SENSATION: WFL   POSTURE: Good posture  PALPATION: Tender to palpation in the  right QL and left lower mid thoracic paraspinals and into her left rib cage  LUMBAR ROM:   AROM eval  Flexion   Extension   Right lateral flexion   Left lateral flexion   Right rotation Pain in right shoulder blade   Left rotation Less pain but pain in the left shoulder blade   (Blank rows = not tested)   LOWER EXTREMITY MMT:    MMT Right eval Left eval  Hip flexion 17.5 15.5  Hip extension    Hip abduction 16.2 16.3  Hip adduction    Hip internal rotation    Hip external rotation    Knee flexion    Knee extension 23.9 19.2  Ankle dorsiflexion    Ankle plantarflexion    Ankle inversion    Ankle eversion     (Blank rows = not tested)  GAIT: No gait deviations;    TODAY'S TREATMENT:                                                                                                                              DATE:   04/02/23 Instruction in theracane STM to R periscapular region  Lateral flexion stretch in standing 10sec x2ea Supine horizontal abd RTB 2x10 Supine Bil ER RTB 2x10 TB row/ext Blue TB 2x10ea Lateral  flexion with  10lb KB 2x10ea  Palloff press   Standing lateral doorway stretch 3 x 20 second holds Seated lateral lean on ball opening R side 10 x 5 second holds Seated ball core strength - HR, march, LAQ, alternating march/shoulder flexion all with TRA activation 1 x 10 each Standing row GTB 2 x 10  Standing shoulder extension GTB 2 x 10 Standing palof press GTB 1 x 10 bilateral Scap retraction with GH ER GTB 2 x 10  STS 2 x 10   03/23/23 Standing lateral doorway stretch 3 x 20 second holds Seated lateral lean on ball opening R side 10 x 5 second holds Seated ball core strength - HR, march, LAQ, alternating march/shoulder flexion all with TRA activation 1 x 10 each Standing row GTB 2 x 10  Standing shoulder extension GTB 2 x 10 Standing palof press GTB 1 x 10 bilateral Scap retraction with GH ER GTB 2 x 10  STS 2 x 10   03/09/23 Sidelying open  book  10 x 10 second holds bilateral Standing lateral doorway stretch 3 x 20 second holds Standing row GTB 2 x 10  Scap retraction with GH ER GTB 2 x 10  Thoracic extension over chair 5 x 10 second holds  03/03/2023 - Sidelying Open Book Thoracic Lumbar Rotation and Extension  - 1 x daily - 7 x weekly - 1 sets - 10 reps - Theracane Over Shoulder  - 1 x daily - 7 x weekly - 3 sets - 1-96min  hold - Standing Bilateral Low Shoulder Row with Anchored Resistance  - 1 x daily - 7 x weekly - 3 sets - 10 reps - Standing Glute Med Mobilization with Small Ball on Wall  - 1 x daily - 7 x weekly - 3 sets - 10 reps   Reviewed spots for self soft tissue mobilization   PATIENT EDUCATION:  Education details: HEP, symptom management, 03/09/23: HEP, weightbearing exercises and resistance training for bone density Person educated: Patient Education method: Explanation, Demonstration, Tactile cues, Verbal cues, and Handouts Education comprehension: verbalized understanding, returned demonstration, verbal cues required, tactile cues required, and needs further education  HOME EXERCISE PROGRAM: Access Code: [email or web address] URL: [email or web address]  Date: 03/03/2023 - Sidelying Open Book Thoracic Lumbar Rotation and Extension  - 1 x daily - 7 x weekly - 1 sets - 10 reps - Theracane Over Shoulder  - 1 x daily - 7 x weekly - 3 sets - 1-75min  hold - Standing Bilateral Low Shoulder Row with Anchored Resistance  - 1 x daily - 7 x weekly - 3 sets - 10 reps - Standing Glute Med Mobilization with Small Ball on Wall  - 1 x daily - 7 x weekly - 3 sets - 10 reps  03/09/23- Lateral Shift Correction at Wall  - 1 x daily - 7 x weekly - 3 reps - 30 second hold - Shoulder extension with resistance - Neutral  - 1 x daily - 7 x weekly - 2 sets - 10 reps - Shoulder External Rotation and Scapular Retraction with Resistance  - 1 x daily - 7 x weekly - 2 sets - 10 reps  03/23/23- - Swiss Ball March  - 1 x daily - 7 x  weekly - 2 sets - 10 reps - Swiss Ball Knee Extension  - 1 x daily - 7 x weekly - 2 sets - 10 reps - Seated March with Opposite Arm Flexion on Swiss Ball  - 1 x  daily - 7 x weekly - 2 sets - 10 reps - Shoulder extension with resistance - Neutral  - 1 x daily - 7 x weekly - 2 sets - 10 reps - Standing Anti-Rotation Press with Anchored Resistance  - 1 x daily - 7 x weekly - 2 sets - 10 reps - Sit to Stand with Arms Crossed  - 1 x daily - 7 x weekly - 2 sets - 10 reps  ASSESSMENT:  CLINICAL IMPRESSION: Reported benefit from use of theracane. STM performed to R periscapular region with significant restrictions palpated. Good tolerance for postural re-ed and strengthening. Discussed anatomy and dx with pt. She denied pain with exercises. Will continue to progress as tolerated.   OBJECTIVE IMPAIRMENTS: decreased activity tolerance, decreased ROM, increased fascial restrictions, and pain.   ACTIVITY LIMITATIONS: sitting, standing, and locomotion level  PARTICIPATION LIMITATIONS: community activity and occupation  PERSONAL FACTORS: 1-2 comorbidities: osteopenia scoliosis   are also affecting patient's functional outcome.   REHAB POTENTIAL: Excellent  CLINICAL DECISION MAKING: Stable/uncomplicated  EVALUATION COMPLEXITY: Low   GOALS: Goals reviewed with patient? Yes  SHORT TERM GOALS: Target date: 03/31/2023    Patient will perform right rotation without increased pain Baseline: Goal status: INITIAL  2.  Patient will increase gross bilateral lower extremity strength by 5 pounds Baseline:  Goal status: INITIAL  3.  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  Patient will be independent with complete exercise program to continue to improve posture Baseline:  Goal status:  INITIAL  3.  Patient will perform rotational activity in her home without increased pain in order to perform ADLs Baseline:  Goal status: INITIAL  PLAN:  PT FREQUENCY: 1-2x/week  PT DURATION: 8 weeks  PLANNED INTERVENTIONS: Therapeutic exercises, Therapeutic activity, Neuromuscular re-education, Balance training, Gait training, Patient/Family education, Self Care, Joint mobilization, Stair training, DME instructions, Aquatic Therapy, Dry Needling, Electrical stimulation, Cryotherapy, Moist heat, Taping, Manual therapy, and Re-evaluation.   PLAN FOR NEXT SESSION:  Consider right side standing doorway stretch for scoliosis, continue with posterior chain strengthening including shoulder extension and bilateral external rotation.  Patient does have osteopenia but can consider loading exercises.  Patient does not enjoy the machines.  She is a gym member but does not plan on doing the machines we can still potentially work some loading exercises but things like squats if that something she is interested in.  Patient mostly likes to do classes.  Consider manual therapy if pain persists for right QL and left paraspinals.   Donnel Saxon Cedric Mcclaine, PTA 04/02/2023, 11:20 AM

## 2023-04-13 ENCOUNTER — Ambulatory Visit (HOSPITAL_BASED_OUTPATIENT_CLINIC_OR_DEPARTMENT_OTHER): Payer: Medicare Other | Attending: Family Medicine

## 2023-04-13 ENCOUNTER — Encounter (HOSPITAL_BASED_OUTPATIENT_CLINIC_OR_DEPARTMENT_OTHER): Payer: Self-pay

## 2023-04-13 DIAGNOSIS — R293 Abnormal posture: Secondary | ICD-10-CM | POA: Diagnosis present

## 2023-04-13 DIAGNOSIS — M546 Pain in thoracic spine: Secondary | ICD-10-CM | POA: Insufficient documentation

## 2023-04-13 DIAGNOSIS — M5459 Other low back pain: Secondary | ICD-10-CM | POA: Diagnosis present

## 2023-04-13 NOTE — Therapy (Signed)
OUTPATIENT PHYSICAL THERAPY TREATMENT   Patient Name: Beverly [name] MRN: [id number] DOB:04/28/1946, 77 y.o., female Today's Date: 04/13/2023  END OF SESSION:  PT End of Session - 04/13/23 0806     Visit Number 5    Number of Visits 8    Date for PT Re-Evaluation 04/28/23    Authorization Type Medicare    PT Start Time 0804    PT Stop Time 0846    PT Time Calculation (min) 42 min    Activity Tolerance Patient tolerated treatment well    Behavior During Therapy [facility] for tasks assessed/performed               Past Medical History:  Diagnosis Date   Allergic rhinitis    Allergy    Anemia    Asthma    Cataract    bilateral cataracts removed   Chronic cough    Esophageal reflux    IBS (irritable bowel syndrome)    Osteopenia 03/14/2013   DEXA @LB  02/2013: -1.8   Past Surgical History:  Procedure Laterality Date   bilateral cateracts removed     BREAST LUMPECTOMY Left    FOOT SURGERY     right   LAPAROSCOPIC CHOLECYSTECTOMY  11/2009   right foot bunionectomy     TUBAL LIGATION     Patient Active Problem List   Diagnosis Date Noted   Degenerative disc disease, cervical 12/19/2019   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   Nonallopathic lesion of lumbosacral region 03/13/2014   Nonallopathic lesion of sacral region 03/13/2014   Nonallopathic lesion of thoracic region 03/13/2014   Osteopenia 03/14/2013   Postmenopausal disorder 02/15/2013   Lumbago 05/23/2012   Irritable bowel syndrome 04/08/2008   ALLERGIC RHINITIS 06/29/2007   Esophageal reflux 06/29/2007   Cough 06/29/2007    PCP: Dr Elesa Massed  REFERRING PROVIDER: Dr Antoine Primas DO   REFERRING DIAG:  Diagnosis  M54.50 (ICD-10-CM) - Lumbar spine pain    Rationale for Evaluation and Treatment: Rehabilitation  THERAPY DIAG:  Other low back pain  Pain in thoracic spine  Abnormal posture  ONSET DATE:   SUBJECTIVE:                                                                                                                                                                                            SUBJECTIVE STATEMENT: Pt reports she sprained her L ankle last Sunday (over a week ago) when she stood up from a chair and her foot was asleep and buckled on her. Husband helped her from falling to floor.  Able to put weight on it now and the  swelling is improved. No pain in back at entry, just her typical.    EVAL: Patient has a long history of low back pain and midthoracic pain.  She was diagnosed with scoliosis.  She also has a history of osteopenia.  She reports over the past few years she is lost 2 inches of height.  She has increased pain when she stands for too long and with rotational activity.  She has increased pain rotating to the right.  She feels like the pain can be more in her left rib cage at times and also in her right lower back.  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  PERTINENT HISTORY:  Osteopenia, bilateral cataracts; right foot bunionectomy,    PAIN:  Are you having pain? Yes: NPRS scale: 3-4/10 Pain location: Can be left or right depending on the day Pain description: discomfort, stiffness Aggravating factors: turning to the left and right  Relieving factors: tylenol   PRECAUTIONS: None  RED FLAGS: None   WEIGHT BEARING RESTRICTIONS: No  FALLS:  Has patient fallen in last 6 months? No  LIVING ENVIRONMENT: Has steps but no issues OCCUPATION:  Consulting civil engineer and painting   Hobbies:  Yoga,  Painting Horseback riding         PLOF: Independent  PATIENT GOALS: To develop a plan to reduce pain and prevent further increase in pain  NEXT MD VISIT: When she is done PT for a few weeks  OBJECTIVE:   DIAGNOSTIC FINDINGS:    PATIENT SURVEYS:  FOTO    SCREENING FOR RED FLAGS: Bowel or bladder incontinence: No Spinal tumors: No Cauda equina syndrome: No Compression fracture: No Abdominal aneurysm:  No  COGNITION: Overall cognitive status: Within functional limits for tasks assessed     SENSATION: WFL   POSTURE: Good posture  PALPATION: Tender to palpation in the right QL and left lower mid thoracic paraspinals and into her left rib cage  LUMBAR ROM:   AROM eval  Flexion   Extension   Right lateral flexion   Left lateral flexion   Right rotation Pain in right shoulder blade   Left rotation Less pain but pain in the left shoulder blade   (Blank rows = not tested)   LOWER EXTREMITY MMT:    MMT Right eval Left eval  Hip flexion 17.5 15.5  Hip extension    Hip abduction 16.2 16.3  Hip adduction    Hip internal rotation    Hip external rotation    Knee flexion    Knee extension 23.9 19.2  Ankle dorsiflexion    Ankle plantarflexion    Ankle inversion    Ankle eversion     (Blank rows = not tested)  GAIT: No gait deviations;    TODAY'S TREATMENT:  DATE:   04/13/23 STM to R lat in sidelying Sidelying horizontal abd 1# x15 Supine horizontal abd RTB 2x10 Supine Bil ER RTB 2x10 TB row/ext Blue TB 2x15ea PNF d2 bilateral 1# x10 supine    04/02/23 Instruction in theracane STM to R periscapular region  Lateral flexion stretch in standing 10sec x2ea Supine horizontal abd RTB 2x10 Supine Bil ER RTB 2x10 TB row/ext Blue TB 2x10ea Lateral flexion with  10lb KB 2x10ea  Palloff press   Standing lateral doorway stretch 3 x 20 second holds Seated lateral lean on ball opening R side 10 x 5 second holds Seated ball core strength - HR, march, LAQ, alternating march/shoulder flexion all with TRA activation 1 x 10 each Standing row GTB 2 x 10  Standing shoulder extension GTB 2 x 10 Standing palof press GTB 1 x 10 bilateral Scap retraction with GH ER GTB 2 x 10  STS 2 x 10   03/23/23 Standing lateral doorway stretch 3 x 20 second  holds Seated lateral lean on ball opening R side 10 x 5 second holds Seated ball core strength - HR, march, LAQ, alternating march/shoulder flexion all with TRA activation 1 x 10 each Standing row GTB 2 x 10  Standing shoulder extension GTB 2 x 10 Standing palof press GTB 1 x 10 bilateral Scap retraction with GH ER GTB 2 x 10  STS 2 x 10   03/09/23 Sidelying open book  10 x 10 second holds bilateral Standing lateral doorway stretch 3 x 20 second holds Standing row GTB 2 x 10  Scap retraction with GH ER GTB 2 x 10  Thoracic extension over chair 5 x 10 second holds  PATIENT EDUCATION:  Education details: HEP, symptom management, 03/09/23: HEP, weightbearing exercises and resistance training for bone density Person educated: Patient Education method: Explanation, Demonstration, Tactile cues, Verbal cues, and Handouts Education comprehension: verbalized understanding, returned demonstration, verbal cues required, tactile cues required, and needs further education  HOME EXERCISE PROGRAM: Access Code: [email or web address] URL: [email or web address]  Date: 03/03/2023 - Sidelying Open Book Thoracic Lumbar Rotation and Extension  - 1 x daily - 7 x weekly - 1 sets - 10 reps - Theracane Over Shoulder  - 1 x daily - 7 x weekly - 3 sets - 1-62min  hold - Standing Bilateral Low Shoulder Row with Anchored Resistance  - 1 x daily - 7 x weekly - 3 sets - 10 reps - Standing Glute Med Mobilization with Small Ball on Wall  - 1 x daily - 7 x weekly - 3 sets - 10 reps  03/09/23- Lateral Shift Correction at Wall  - 1 x daily - 7 x weekly - 3 reps - 30 second hold - Shoulder extension with resistance - Neutral  - 1 x daily - 7 x weekly - 2 sets - 10 reps - Shoulder External Rotation and Scapular Retraction with Resistance  - 1 x daily - 7 x weekly - 2 sets - 10 reps  03/23/23- - Swiss Ball March  - 1 x daily - 7 x weekly - 2 sets - 10 reps - Swiss Ball Knee Extension  - 1 x daily - 7 x weekly - 2 sets -  10 reps - Seated March with Opposite Arm Flexion on Swiss Ball  - 1 x daily - 7 x weekly - 2 sets - 10 reps - Shoulder extension with resistance - Neutral  - 1 x daily - 7 x weekly - 2 sets - 10 reps -  Standing Anti-Rotation Press with Anchored Resistance  - 1 x daily - 7 x weekly - 2 sets - 10 reps - Sit to Stand with Arms Crossed  - 1 x daily - 7 x weekly - 2 sets - 10 reps  ASSESSMENT:  CLINICAL IMPRESSION: Worked on STM to R lats to address tightness here. Pt tender to palpation here. Pt has theracane at home now and can use this over lat area. Good tolerance to progressions to postural re-ed and strengthening. No c/o pain with any exercise.  Next session will review gym exercises for replication of PT exercises with resistance bands/cable column.   OBJECTIVE IMPAIRMENTS: decreased activity tolerance, decreased ROM, increased fascial restrictions, and pain.   ACTIVITY LIMITATIONS: sitting, standing, and locomotion level  PARTICIPATION LIMITATIONS: community activity and occupation  PERSONAL FACTORS: 1-2 comorbidities: osteopenia scoliosis   are also affecting patient's functional outcome.   REHAB POTENTIAL: Excellent  CLINICAL DECISION MAKING: Stable/uncomplicated  EVALUATION COMPLEXITY: Low   GOALS: Goals reviewed with patient? Yes  SHORT TERM GOALS: Target date: 03/31/2023    Patient will perform right rotation without increased pain Baseline: Goal status: INITIAL  2.  Patient will increase gross bilateral lower extremity strength by 5 pounds Baseline:  Goal status: INITIAL  3.  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  Patient will be independent with complete exercise program to continue to improve posture Baseline:  Goal status: INITIAL  3.  Patient  will perform rotational activity in her home without increased pain in order to perform ADLs Baseline:  Goal status: INITIAL  PLAN:  PT FREQUENCY: 1-2x/week  PT DURATION: 8 weeks  PLANNED INTERVENTIONS: Therapeutic exercises, Therapeutic activity, Neuromuscular re-education, Balance training, Gait training, Patient/Family education, Self Care, Joint mobilization, Stair training, DME instructions, Aquatic Therapy, Dry Needling, Electrical stimulation, Cryotherapy, Moist heat, Taping, Manual therapy, and Re-evaluation.   PLAN FOR NEXT SESSION:  Consider right side standing doorway stretch for scoliosis, continue with posterior chain strengthening including shoulder extension and bilateral external rotation.  Patient does have osteopenia but can consider loading exercises.  Patient does not enjoy the machines.  She is a gym member but does not plan on doing the machines we can still potentially work some loading exercises but things like squats if that something she is interested in.  Patient mostly likes to do classes.  Consider manual therapy if pain persists for right QL and left paraspinals.   Donnel Saxon Yael Angerer, PTA 04/13/2023, 8:58 AM

## 2023-04-19 ENCOUNTER — Encounter (HOSPITAL_BASED_OUTPATIENT_CLINIC_OR_DEPARTMENT_OTHER): Payer: Self-pay | Admitting: Physical Therapy

## 2023-04-19 ENCOUNTER — Ambulatory Visit (HOSPITAL_BASED_OUTPATIENT_CLINIC_OR_DEPARTMENT_OTHER): Payer: Medicare Other | Admitting: Physical Therapy

## 2023-04-19 DIAGNOSIS — M5459 Other low back pain: Secondary | ICD-10-CM | POA: Diagnosis not present

## 2023-04-19 DIAGNOSIS — R293 Abnormal posture: Secondary | ICD-10-CM

## 2023-04-19 DIAGNOSIS — M546 Pain in thoracic spine: Secondary | ICD-10-CM

## 2023-04-19 NOTE — Therapy (Signed)
OUTPATIENT PHYSICAL THERAPY TREATMENT   Patient Name: Beverly [name] MRN: [id number] DOB:July 17, 1945, 77 y.o., female Today's Date: 04/20/2023  END OF SESSION:  PT End of Session - 04/19/23 1520     Visit Number 6    Number of Visits 8    Date for PT Re-Evaluation 04/28/23    Authorization Type Medicare    PT Start Time 1515    PT Stop Time 1555    PT Time Calculation (min) 40 min    Activity Tolerance Patient tolerated treatment well    Behavior During Therapy [facility] for tasks assessed/performed               Past Medical History:  Diagnosis Date   Allergic rhinitis    Allergy    Anemia    Asthma    Cataract    bilateral cataracts removed   Chronic cough    Esophageal reflux    IBS (irritable bowel syndrome)    Osteopenia 03/14/2013   DEXA @LB  02/2013: -1.8   Past Surgical History:  Procedure Laterality Date   bilateral cateracts removed     BREAST LUMPECTOMY Left    FOOT SURGERY     right   LAPAROSCOPIC CHOLECYSTECTOMY  11/2009   right foot bunionectomy     TUBAL LIGATION     Patient Active Problem List   Diagnosis Date Noted   Degenerative disc disease, cervical 12/19/2019   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   Nonallopathic lesion of lumbosacral region 03/13/2014   Nonallopathic lesion of sacral region 03/13/2014   Nonallopathic lesion of thoracic region 03/13/2014   Osteopenia 03/14/2013   Postmenopausal disorder 02/15/2013   Lumbago 05/23/2012   Irritable bowel syndrome 04/08/2008   ALLERGIC RHINITIS 06/29/2007   Esophageal reflux 06/29/2007   Cough 06/29/2007    PCP: Dr Elesa Massed  REFERRING PROVIDER: Dr Antoine Primas DO   REFERRING DIAG:  Diagnosis  M54.50 (ICD-10-CM) - Lumbar spine pain    Rationale for Evaluation and Treatment: Rehabilitation  THERAPY DIAG:  Other low back pain  Pain in thoracic spine  Abnormal posture  ONSET DATE:   SUBJECTIVE:                                                                                                                                                                                            SUBJECTIVE STATEMENT: The patients ankle is better. She has had very little pain. She feels a little more stiff now that she isn't doing yoga.    EVAL: Patient has a long history of low back pain and midthoracic pain.  She was diagnosed with scoliosis.  She also has a history of osteopenia.  She reports over the past few years she is lost 2 inches of height.  She has increased pain when she stands for too long and with rotational activity.  She has increased pain rotating to the right.  She feels like the pain can be more in her left rib cage at times and also in her right lower back.  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  PERTINENT HISTORY:  Osteopenia, bilateral cataracts; right foot bunionectomy,    PAIN:  Are you having pain? Yes: NPRS scale: 3-4/10 Pain location: Can be left or right depending on the day Pain description: discomfort, stiffness Aggravating factors: turning to the left and right  Relieving factors: tylenol   PRECAUTIONS: None  RED FLAGS: None   WEIGHT BEARING RESTRICTIONS: No  FALLS:  Has patient fallen in last 6 months? No  LIVING ENVIRONMENT: Has steps but no issues OCCUPATION:  Consulting civil engineer and painting   Hobbies:  Yoga,  Painting Horseback riding         PLOF: Independent  PATIENT GOALS: To develop a plan to reduce pain and prevent further increase in pain  NEXT MD VISIT: When she is done PT for a few weeks  OBJECTIVE:   DIAGNOSTIC FINDINGS:    PATIENT SURVEYS:  FOTO    SCREENING FOR RED FLAGS: Bowel or bladder incontinence: No Spinal tumors: No Cauda equina syndrome: No Compression fracture: No Abdominal aneurysm: No  COGNITION: Overall cognitive status: Within functional limits for tasks assessed     SENSATION: WFL   POSTURE: Good posture  PALPATION: Tender to  palpation in the right QL and left lower mid thoracic paraspinals and into her left rib cage  LUMBAR ROM:   AROM eval  Flexion   Extension   Right lateral flexion   Left lateral flexion   Right rotation Pain in right shoulder blade   Left rotation Less pain but pain in the left shoulder blade   (Blank rows = not tested)   LOWER EXTREMITY MMT:    MMT Right eval Left eval  Hip flexion 17.5 15.5  Hip extension    Hip abduction 16.2 16.3  Hip adduction    Hip internal rotation    Hip external rotation    Knee flexion    Knee extension 23.9 19.2  Ankle dorsiflexion    Ankle plantarflexion    Ankle inversion    Ankle eversion     (Blank rows = not tested)  GAIT: No gait deviations;    TODAY'S TREATMENT:                                                                                                                              DATE:  10/8 STM to R lat in sidelying  Open book x10  Sidelying horizontal abd 1# x15    TB row/ext Blue TB 2x15ea  Supine Bil ER RTB 2x10 PNF d2 bilateral 1# x10 supine  Standing flexion 2x10 2 lbs weights 2nd set  Scaption 1x10 2 lb weights s   04/13/23 STM to R lat in sidelying Sidelying horizontal abd 1# x15 Supine horizontal abd RTB 2x10 Supine Bil ER RTB 2x10 TB row/ext Blue TB 2x15ea PNF d2 bilateral 1# x10 supine    04/02/23 Instruction in theracane STM to R periscapular region  Lateral flexion stretch in standing 10sec x2ea Supine horizontal abd RTB 2x10 Supine Bil ER RTB 2x10 TB row/ext Blue TB 2x10ea Lateral flexion with  10lb KB 2x10ea  Palloff press   Standing lateral doorway stretch 3 x 20 second holds Seated lateral lean on ball opening R side 10 x 5 second holds Seated ball core strength - HR, march, LAQ, alternating march/shoulder flexion all with TRA activation 1 x 10 each Standing row GTB 2 x 10  Standing shoulder extension GTB 2 x 10 Standing palof press GTB 1 x 10 bilateral Scap retraction with GH ER  GTB 2 x 10  STS 2 x 10   03/23/23 Standing lateral doorway stretch 3 x 20 second holds Seated lateral lean on ball opening R side 10 x 5 second holds Seated ball core strength - HR, march, LAQ, alternating march/shoulder flexion all with TRA activation 1 x 10 each Standing row GTB 2 x 10  Standing shoulder extension GTB 2 x 10 Standing palof press GTB 1 x 10 bilateral Scap retraction with GH ER GTB 2 x 10  STS 2 x 10   03/09/23 Sidelying open book  10 x 10 second holds bilateral Standing lateral doorway stretch 3 x 20 second holds Standing row GTB 2 x 10  Scap retraction with GH ER GTB 2 x 10  Thoracic extension over chair 5 x 10 second holds  PATIENT EDUCATION:  Education details: HEP, symptom management, 03/09/23: HEP, weightbearing exercises and resistance training for bone density Person educated: Patient Education method: Explanation, Demonstration, Tactile cues, Verbal cues, and Handouts Education comprehension: verbalized understanding, returned demonstration, verbal cues required, tactile cues required, and needs further education  HOME EXERCISE PROGRAM: Access Code: [email or web address] URL: [email or web address]  Date: 03/03/2023 - Sidelying Open Book Thoracic Lumbar Rotation and Extension  - 1 x daily - 7 x weekly - 1 sets - 10 reps - Theracane Over Shoulder  - 1 x daily - 7 x weekly - 3 sets - 1-22min  hold - Standing Bilateral Low Shoulder Row with Anchored Resistance  - 1 x daily - 7 x weekly - 3 sets - 10 reps - Standing Glute Med Mobilization with Small Ball on Wall  - 1 x daily - 7 x weekly - 3 sets - 10 reps  03/09/23- Lateral Shift Correction at Wall  - 1 x daily - 7 x weekly - 3 reps - 30 second hold - Shoulder extension with resistance - Neutral  - 1 x daily - 7 x weekly - 2 sets - 10 reps - Shoulder External Rotation and Scapular Retraction with Resistance  - 1 x daily - 7 x weekly - 2 sets - 10 reps  03/23/23- - Swiss Ball March  - 1 x daily - 7 x weekly -  2 sets - 10 reps - Swiss Ball Knee Extension  - 1 x daily - 7 x weekly - 2 sets - 10 reps - Seated March with Opposite Arm Flexion on Swiss Ball  - 1 x daily - 7 x weekly - 2  sets - 10 reps - Shoulder extension with resistance - Neutral  - 1 x daily - 7 x weekly - 2 sets - 10 reps - Standing Anti-Rotation Press with Anchored Resistance  - 1 x daily - 7 x weekly - 2 sets - 10 reps - Sit to Stand with Arms Crossed  - 1 x daily - 7 x weekly - 2 sets - 10 reps  ASSESSMENT:  CLINICAL IMPRESSION: Continued to work on tight areas on the right side. Improved muscle tension felt. We reviewed free weight exercises she can replicate in the gym. We will continue to progress as tolerated. We will re-assess in the next visit or two.  OBJECTIVE IMPAIRMENTS: decreased activity tolerance, decreased ROM, increased fascial restrictions, and pain.   ACTIVITY LIMITATIONS: sitting, standing, and locomotion level  PARTICIPATION LIMITATIONS: community activity and occupation  PERSONAL FACTORS: 1-2 comorbidities: osteopenia scoliosis   are also affecting patient's functional outcome.   REHAB POTENTIAL: Excellent  CLINICAL DECISION MAKING: Stable/uncomplicated  EVALUATION COMPLEXITY: Low   GOALS: Goals reviewed with patient? Yes  SHORT TERM GOALS: Target date: 03/31/2023    Patient will perform right rotation without increased pain Baseline: Goal status: INITIAL  2.  Patient will increase gross bilateral lower extremity strength by 5 pounds Baseline:  Goal status: INITIAL  3.  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  Patient will be independent with complete exercise program to continue to improve posture Baseline:  Goal status: INITIAL  3.  Patient will perform rotational activity  in her home without increased pain in order to perform ADLs Baseline:  Goal status: INITIAL  PLAN:  PT FREQUENCY: 1-2x/week  PT DURATION: 8 weeks  PLANNED INTERVENTIONS: Therapeutic exercises, Therapeutic activity, Neuromuscular re-education, Balance training, Gait training, Patient/Family education, Self Care, Joint mobilization, Stair training, DME instructions, Aquatic Therapy, Dry Needling, Electrical stimulation, Cryotherapy, Moist heat, Taping, Manual therapy, and Re-evaluation.   PLAN FOR NEXT SESSION:  Consider right side standing doorway stretch for scoliosis, continue with posterior chain strengthening including shoulder extension and bilateral external rotation.  Patient does have osteopenia but can consider loading exercises.  Patient does not enjoy the machines.  She is a gym member but does not plan on doing the machines we can still potentially work some loading exercises but things like squats if that something she is interested in.  Patient mostly likes to do classes.  Consider manual therapy if pain persists for right QL and left paraspinals.   Dessie Coma, PT 04/20/2023, 9:56 AM

## 2023-04-20 ENCOUNTER — Encounter (HOSPITAL_BASED_OUTPATIENT_CLINIC_OR_DEPARTMENT_OTHER): Payer: Self-pay | Admitting: Physical Therapy

## 2023-04-21 ENCOUNTER — Other Ambulatory Visit (HOSPITAL_BASED_OUTPATIENT_CLINIC_OR_DEPARTMENT_OTHER): Payer: Self-pay

## 2023-04-21 MED ORDER — COMIRNATY 30 MCG/0.3ML IM SUSY
PREFILLED_SYRINGE | INTRAMUSCULAR | 0 refills | Status: AC
  Filled 2023-04-21: qty 0.3, 1d supply, fill #0

## 2023-04-21 NOTE — Progress Notes (Unsigned)
  [name] [address] [place] [address] Phone: [phone] Subjective:   Beverly [name], am serving as a scribe for Dr. Antoine Primas.  I'm seeing this patient by the request  of:  Combs, Prince Solian, DO  CC: low back pain   IHK:[id number]  Beverly [name] is a 77 y.o. female coming in with complaint of back and neck pain. OMT on 03/22/2023. Been in PT since we have seen her. Patient states that she has been doing PT which has been helpful. Also getting massage once a month.   Medications patient has been prescribed:   Taking:         Reviewed prior external information including notes and imaging from previsou exam, outside providers and external EMR if available.   As well as notes that were available from care everywhere and other healthcare systems.  Past medical history, social, surgical and family history all reviewed in electronic medical record.  No pertanent information unless stated regarding to the chief complaint.   Past Medical History:  Diagnosis Date   Allergic rhinitis    Allergy    Anemia    Asthma    Cataract    bilateral cataracts removed   Chronic cough    Esophageal reflux    IBS (irritable bowel syndrome)    Osteopenia 03/14/2013   DEXA @[facility]  02/2013: -1.8    No Known Allergies   Review of Systems:  No headache, visual changes, nausea, vomiting, diarrhea, constipation, dizziness, abdominal pain, skin rash, fevers, chills, night sweats, weight loss, swollen lymph nodes, body aches, joint swelling, chest pain, shortness of breath, mood changes. POSITIVE muscle aches  Objective  Blood pressure 110/72, height 5\' 3"  (1.6 m), weight 128 lb (58.1 kg).   General: No apparent distress alert and oriented x3 mood and affect normal, dressed appropriately.  HEENT: Pupils equal, extraocular movements intact  Respiratory: Patient's speak in full sentences and does not appear short of breath  Cardiovascular: No  lower extremity edema, non tender, no erythema  Significant scoliosis noted.  Tightness noted of multiple joints noted. Osteopathic findings  C2 flexed rotated and side bent right C5 flexed rotated and side bent right T3 extended rotated and side bent right inhaled rib T9 extended rotated and side bent left L2 flexed rotated and side bent right  Sacrum right on right       Assessment and Plan:  Degenerative disc disease, cervical Degenerative disc of the neck noted.  Discussed posture and ergonomics, discussed which activities to do and which ones to avoid.  Increasing activity slowly over the course of next several days.  Follow-up with me again in 6 to 8 weeks otherwise    Nonallopathic problems  Decision today to treat with OMT was based on Physical Exam  After verbal consent patient was treated with HVLA, ME, FPR techniques in cervical, rib, thoracic, lumbar, and sacral  areas  Patient tolerated the procedure well with improvement in symptoms  Patient given exercises, stretches and lifestyle modifications  See medications in patient instructions if given  Patient will follow up in 4-8 weeks    The above documentation has been reviewed and is accurate and complete [name], DO          Note: This dictation was prepared with Dragon dictation along with smaller phrase technology. Any transcriptional errors that result from this process are unintentional.

## 2023-04-26 ENCOUNTER — Ambulatory Visit (INDEPENDENT_AMBULATORY_CARE_PROVIDER_SITE_OTHER): Payer: Medicare Other | Admitting: Family Medicine

## 2023-04-26 ENCOUNTER — Encounter: Payer: Self-pay | Admitting: Family Medicine

## 2023-04-26 VITALS — BP 110/72 | Ht 63.0 in | Wt 128.0 lb

## 2023-04-26 DIAGNOSIS — M9903 Segmental and somatic dysfunction of lumbar region: Secondary | ICD-10-CM

## 2023-04-26 DIAGNOSIS — M9902 Segmental and somatic dysfunction of thoracic region: Secondary | ICD-10-CM

## 2023-04-26 DIAGNOSIS — M9904 Segmental and somatic dysfunction of sacral region: Secondary | ICD-10-CM

## 2023-04-26 DIAGNOSIS — M5030 Other cervical disc degeneration, unspecified cervical region: Secondary | ICD-10-CM

## 2023-04-26 DIAGNOSIS — M9908 Segmental and somatic dysfunction of rib cage: Secondary | ICD-10-CM | POA: Diagnosis not present

## 2023-04-26 DIAGNOSIS — M9901 Segmental and somatic dysfunction of cervical region: Secondary | ICD-10-CM

## 2023-04-26 NOTE — Patient Instructions (Signed)
See me in 5-6 weeks

## 2023-04-26 NOTE — Assessment & Plan Note (Signed)
Degenerative disc of the neck noted.  Discussed posture and ergonomics, discussed which activities to do and which ones to avoid.  Increasing activity slowly over the course of next several days.  Follow-up with me again in 6 to 8 weeks otherwise

## 2023-04-27 ENCOUNTER — Encounter: Payer: Self-pay | Admitting: Dermatology

## 2023-04-27 ENCOUNTER — Ambulatory Visit: Payer: Medicare Other | Admitting: Dermatology

## 2023-04-27 VITALS — BP 134/88 | HR 56

## 2023-04-27 DIAGNOSIS — Z808 Family history of malignant neoplasm of other organs or systems: Secondary | ICD-10-CM | POA: Diagnosis not present

## 2023-04-27 DIAGNOSIS — L570 Actinic keratosis: Secondary | ICD-10-CM | POA: Diagnosis not present

## 2023-04-27 DIAGNOSIS — N938 Other specified abnormal uterine and vaginal bleeding: Secondary | ICD-10-CM | POA: Insufficient documentation

## 2023-04-27 DIAGNOSIS — N959 Unspecified menopausal and perimenopausal disorder: Secondary | ICD-10-CM

## 2023-04-27 DIAGNOSIS — L821 Other seborrheic keratosis: Secondary | ICD-10-CM

## 2023-04-27 DIAGNOSIS — L719 Rosacea, unspecified: Secondary | ICD-10-CM | POA: Diagnosis not present

## 2023-04-27 DIAGNOSIS — L820 Inflamed seborrheic keratosis: Secondary | ICD-10-CM | POA: Diagnosis not present

## 2023-04-27 HISTORY — DX: Unspecified menopausal and perimenopausal disorder: N95.9

## 2023-04-27 NOTE — Patient Instructions (Addendum)

## 2023-04-27 NOTE — Progress Notes (Signed)
New Patient Visit   Subjective  Beverly [name] is a 77 y.o. female who presents for the following: Spot Check  Patient states she has spots located at the nose and posterior left knee that she would like to have examined. Patient reports the areas have been there for several months. She reports the areas are not bothersome.Patient rates irritation 0 out of 10. She states that the areas have not spread. Patient reports she has not previously been treated for these areas. Patient report Hx of bx. Patient reports family history of skin cancer(s)(Daughter Unsure of type of cancer but treated with MOHS).  The patient has spots, moles and lesions to be evaluated, some may be new or changing and the patient may have concern these could be cancer.  The following portions of the chart were reviewed this encounter and updated as appropriate: medications, allergies, medical history  Review of Systems:  No other skin or systemic complaints except as noted in HPI or Assessment and Plan.  Objective  Well appearing patient in no apparent distress; mood and affect are within normal limits.  A focused examination was performed of the following areas: Nose, Posterior Left Leg, Back  Relevant exam findings are noted in the Assessment and Plan.  Left Lower Back, Left Upper Back, Mid Back, Right Lower Back Stuck-on, waxy, tan-brown papules and/or plaques  Left Forehead, Left Temple Erythematous thin papules/macules with gritty scale.          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 It is treatable but not curable. It sometimes affects the eyes (ocular rosacea) as well. It may respond to topical and/or systemic medication and can flare with stress, sun exposure, alcohol, exercise, topical steroids (including  hydrocortisone/cortisone 10) and some foods.  Daily application of broad spectrum spf 30+ sunscreen to face is recommended to reduce flares.  Patient denies grittiness of the eyes  Treatment Plan: - Benign-appearing - Discussed benign etiology and prognosis. - Observe - Call for any changes  SEBORRHEIC KERATOSIS Exam: Stuck-on, waxy, tan-brown papules and/or plaques on Posterior left leg and Back  Treatment Plan: Inflamed seborrheic keratosis (4) Left Upper Back; Left Lower Back; Right Lower Back; Mid Back  Symptomatic, irritating, patient would like treated.  Benign-appearing.  Call clinic for new or changing lesions.    Destruction of lesion - Left Lower Back, Left Upper Back, Mid Back, Right Lower Back Complexity: simple   Destruction method: cryotherapy   Informed consent: discussed and consent obtained   Timeout:  patient name, date of birth, surgical site, and procedure verified Lesion destroyed using liquid nitrogen: Yes   Cryotherapy cycles:  4 Post-procedure details: wound care instructions given    AK (actinic keratosis) (2) Left Temple; Left Forehead  Destruction of lesion - Left Forehead, Left Temple Complexity: simple   Destruction method: cryotherapy   Informed consent: discussed and consent obtained   Timeout:  patient name, date of birth, surgical site, and procedure verified Lesion destroyed using liquid nitrogen: Yes   Cryotherapy cycles:  2 Post-procedure details: wound care instructions given    Return in about 3 months (around 07/28/2023) for TBSE.   Documentation: I have reviewed the above documentation for accuracy and completeness, and I agree with the above.  Stasia Cavalier, am acting as scribe for Langston Reusing, DO.  Langston Reusing, DO

## 2023-04-28 ENCOUNTER — Ambulatory Visit (HOSPITAL_BASED_OUTPATIENT_CLINIC_OR_DEPARTMENT_OTHER): Payer: Medicare Other | Admitting: Physical Therapy

## 2023-04-28 DIAGNOSIS — R293 Abnormal posture: Secondary | ICD-10-CM

## 2023-04-28 DIAGNOSIS — L719 Rosacea, unspecified: Secondary | ICD-10-CM | POA: Insufficient documentation

## 2023-04-28 DIAGNOSIS — M546 Pain in thoracic spine: Secondary | ICD-10-CM

## 2023-04-28 DIAGNOSIS — M5459 Other low back pain: Secondary | ICD-10-CM | POA: Diagnosis not present

## 2023-04-28 NOTE — Therapy (Signed)
OUTPATIENT PHYSICAL THERAPY TREATMENT   Patient Name: Beverly [name] MRN: [id number] DOB:10/17/1945, 77 y.o., female Today's Date: 04/28/2023  END OF SESSION:  PT End of Session - 04/28/23 1036     Visit Number 7    Number of Visits 8    Date for PT Re-Evaluation 04/28/23    Authorization Type Medicare    PT Start Time 1021   patient 6 min late   PT Stop Time 1100    PT Time Calculation (min) 39 min    Activity Tolerance Patient tolerated treatment well    Behavior During Therapy WFL for tasks assessed/performed               Past Medical History:  Diagnosis Date   Allergic rhinitis    Allergy    Anemia    Asthma    Cataract    bilateral cataracts removed   Chronic cough    Esophageal reflux    IBS (irritable bowel syndrome)    Osteopenia 03/14/2013   DEXA @[facility]  02/2013: -1.8   Past Surgical History:  Procedure Laterality Date   bilateral cateracts removed     BREAST LUMPECTOMY Left    FOOT SURGERY     right   LAPAROSCOPIC CHOLECYSTECTOMY  11/2009   right foot bunionectomy     TUBAL LIGATION     Patient Active Problem List   Diagnosis Date Noted   Dysfunctional uterine bleeding 04/27/2023   Menopausal problem 04/27/2023   Degenerative disc disease, cervical 12/19/2019   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   Nonallopathic lesion of lumbosacral region 03/13/2014   Nonallopathic lesion of sacral region 03/13/2014   Nonallopathic lesion of thoracic region 03/13/2014   Osteopenia 03/14/2013   Postmenopausal disorder 02/15/2013   Lumbago 05/23/2012   Irritable bowel syndrome 04/08/2008   ALLERGIC RHINITIS 06/29/2007   Esophageal reflux 06/29/2007   Cough 06/29/2007    PCP: Dr Elesa Massed  REFERRING PROVIDER: Dr Antoine Primas DO   REFERRING DIAG:  Diagnosis  M54.50 (ICD-10-CM) - Lumbar spine pain    Rationale for Evaluation and Treatment: Rehabilitation  THERAPY DIAG:  Other low back pain  Pain in thoracic spine  Abnormal  posture  ONSET DATE:   SUBJECTIVE:                                                                                                                                                                                           SUBJECTIVE STATEMENT: The patient did a lot of work over the last week and had some pain. She was able to do stretches and work it out.   EVAL: Patient has  a long history of low back pain and midthoracic pain.  She was diagnosed with scoliosis.  She also has a history of osteopenia.  She reports over the past few years she is lost 2 inches of height.  She has increased pain when she stands for too long and with rotational activity.  She has increased pain rotating to the right.  She feels like the pain can be more in her left rib cage at times and also in her right lower back.  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  PERTINENT HISTORY:  Osteopenia, bilateral cataracts; right foot bunionectomy,    PAIN:  Are you having pain? Yes: NPRS scale: 3-4/10 Pain location: Can be left or right depending on the day Pain description: discomfort, stiffness Aggravating factors: turning to the left and right  Relieving factors: tylenol   PRECAUTIONS: None  RED FLAGS: None   WEIGHT BEARING RESTRICTIONS: No  FALLS:  Has patient fallen in last 6 months? No  LIVING ENVIRONMENT: Has steps but no issues OCCUPATION:  Consulting civil engineer and painting   Hobbies:  Yoga,  Painting Horseback riding         PLOF: Independent  PATIENT GOALS: To develop a plan to reduce pain and prevent further increase in pain  NEXT MD VISIT: When she is done PT for a few weeks  OBJECTIVE:   DIAGNOSTIC FINDINGS:    PATIENT SURVEYS:  FOTO    SCREENING FOR RED FLAGS: Bowel or bladder incontinence: No Spinal tumors: No Cauda equina syndrome: No Compression fracture: No Abdominal aneurysm: No  COGNITION: Overall cognitive status: Within functional  limits for tasks assessed     SENSATION: WFL   POSTURE: Good posture  PALPATION: Tender to palpation in the right QL and left lower mid thoracic paraspinals and into her left rib cage  LUMBAR ROM:   AROM eval  Flexion   Extension   Right lateral flexion   Left lateral flexion   Right rotation Pain in right shoulder blade   Left rotation Less pain but pain in the left shoulder blade   (Blank rows = not tested)   LOWER EXTREMITY MMT:    MMT Right eval Left eval Right  Left   Hip flexion 17.5 15.5 24.8 28.7  Hip extension      Hip abduction 16.2 16.3 23.2 20.6  Hip adduction      Hip internal rotation      Hip external rotation      Knee flexion      Knee extension 23.9 19.2 38.7 39.0  Ankle dorsiflexion      Ankle plantarflexion      Ankle inversion      Ankle eversion       (Blank rows = not tested)  GAIT: No gait deviations;    TODAY'S TREATMENT:  DATE:  10/17 Reviewed strength, ROM and FOTO.   Cables:   Shoulder extension 10 lbs 2x12 Row 2x12 15 lbs  Pallof press 2x10 each side 5 lbs   Reviewed how to use her HEP       10/8 STM to R lat in sidelying  Open book x10  Sidelying horizontal abd 1# x15    TB row/ext Blue TB 2x15ea  Supine Bil ER RTB 2x10 PNF d2 bilateral 1# x10 supine  Standing flexion 2x10 2 lbs weights 2nd set  Scaption 1x10 2 lb weights s   04/13/23 STM to R lat in sidelying Sidelying horizontal abd 1# x15 Supine horizontal abd RTB 2x10 Supine Bil ER RTB 2x10 TB row/ext Blue TB 2x15ea PNF d2 bilateral 1# x10 supine    04/02/23 Instruction in theracane STM to R periscapular region  Lateral flexion stretch in standing 10sec x2ea Supine horizontal abd RTB 2x10 Supine Bil ER RTB 2x10 TB row/ext Blue TB 2x10ea Lateral flexion with  10lb KB 2x10ea  Palloff press   Standing lateral  doorway stretch 3 x 20 second holds Seated lateral lean on ball opening R side 10 x 5 second holds Seated ball core strength - HR, march, LAQ, alternating march/shoulder flexion all with TRA activation 1 x 10 each Standing row GTB 2 x 10  Standing shoulder extension GTB 2 x 10 Standing palof press GTB 1 x 10 bilateral Scap retraction with GH ER GTB 2 x 10  STS 2 x 10   03/23/23 Standing lateral doorway stretch 3 x 20 second holds Seated lateral lean on ball opening R side 10 x 5 second holds Seated ball core strength - HR, march, LAQ, alternating march/shoulder flexion all with TRA activation 1 x 10 each Standing row GTB 2 x 10  Standing shoulder extension GTB 2 x 10 Standing palof press GTB 1 x 10 bilateral Scap retraction with GH ER GTB 2 x 10  STS 2 x 10   03/09/23 Sidelying open book  10 x 10 second holds bilateral Standing lateral doorway stretch 3 x 20 second holds Standing row GTB 2 x 10  Scap retraction with GH ER GTB 2 x 10  Thoracic extension over chair 5 x 10 second holds  PATIENT EDUCATION:  Education details: HEP, symptom management, 03/09/23: HEP, weightbearing exercises and resistance training for bone density Person educated: Patient Education method: Explanation, Demonstration, Tactile cues, Verbal cues, and Handouts Education comprehension: verbalized understanding, returned demonstration, verbal cues required, tactile cues required, and needs further education  HOME EXERCISE PROGRAM: Access Code: [email or web address] URL: [email or web address]  Date: 03/03/2023 - Sidelying Open Book Thoracic Lumbar Rotation and Extension  - 1 x daily - 7 x weekly - 1 sets - 10 reps - Theracane Over Shoulder  - 1 x daily - 7 x weekly - 3 sets - 1-57min  hold - Standing Bilateral Low Shoulder Row with Anchored Resistance  - 1 x daily - 7 x weekly - 3 sets - 10 reps - Standing Glute Med Mobilization with Small Ball on Wall  - 1 x daily - 7 x weekly - 3 sets - 10  reps  03/09/23- Lateral Shift Correction at Wall  - 1 x daily - 7 x weekly - 3 reps - 30 second hold - Shoulder extension with resistance - Neutral  - 1 x daily - 7 x weekly - 2 sets - 10 reps - Shoulder External Rotation and Scapular Retraction with Resistance  - 1 x daily - 7  x weekly - 2 sets - 10 reps  03/23/23- - Swiss Ball March  - 1 x daily - 7 x weekly - 2 sets - 10 reps - Swiss Ball Knee Extension  - 1 x daily - 7 x weekly - 2 sets - 10 reps - Seated March with Opposite Arm Flexion on Swiss Ball  - 1 x daily - 7 x weekly - 2 sets - 10 reps - Shoulder extension with resistance - Neutral  - 1 x daily - 7 x weekly - 2 sets - 10 reps - Standing Anti-Rotation Press with Anchored Resistance  - 1 x daily - 7 x weekly - 2 sets - 10 reps - Sit to Stand with Arms Crossed  - 1 x daily - 7 x weekly - 2 sets - 10 reps  ASSESSMENT:  CLINICAL IMPRESSION: The patient has progressed well. Her strength and lumbar ROM have improved significantly. She has a full gym program. She has some pain when she does a lot of activity but she can manage it. She was encoruaged to return to yoga. We reviewed a series of exercises she could do in theygym today. We will discharge to HEP at this time.   OBJECTIVE IMPAIRMENTS: decreased activity tolerance, decreased ROM, increased fascial restrictions, and pain.   ACTIVITY LIMITATIONS: sitting, standing, and locomotion level  PARTICIPATION LIMITATIONS: community activity and occupation  PERSONAL FACTORS: 1-2 comorbidities: osteopenia scoliosis   are also affecting patient's functional outcome.   REHAB POTENTIAL: Excellent  CLINICAL DECISION MAKING: Stable/uncomplicated  EVALUATION COMPLEXITY: Low   GOALS: Goals reviewed with patient? Yes  SHORT TERM GOALS: Target date: 03/31/2023    Patient will perform right rotation without increased pain Baseline: Goal status: No pain achieved 10/17   2.  Patient will increase gross bilateral lower extremity strength  by 5 pounds Baseline:  Goal status:   3.  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  Patient will be independent with complete exercise program to continue to improve posture Baseline:  Goal status: INITIAL  3.  Patient will perform rotational activity in her home without increased pain in order to perform ADLs Baseline:  Goal status: INITIAL  PLAN:  PT FREQUENCY: 1-2x/week  PT DURATION: 8 weeks  PLANNED INTERVENTIONS: Therapeutic exercises, Therapeutic activity, Neuromuscular re-education, Balance training, Gait training, Patient/Family education, Self Care, Joint mobilization, Stair training, DME instructions, Aquatic Therapy, Dry Needling, Electrical stimulation, Cryotherapy, Moist heat, Taping, Manual therapy, and Re-evaluation.   PLAN FOR NEXT SESSION:  Consider right side standing doorway stretch for scoliosis, continue with posterior chain strengthening including shoulder extension and bilateral external rotation.  Patient does have osteopenia but can consider loading exercises.  Patient does not enjoy the machines.  She is a gym member but does not plan on doing the machines we can still potentially work some loading exercises but things like squats if that something she is interested in.  Patient mostly likes to do classes.  Consider manual therapy if pain persists for right QL and left paraspinals.   Dessie Coma, PT 04/28/2023, 12:52 PM

## 2023-05-09 ENCOUNTER — Encounter (HOSPITAL_BASED_OUTPATIENT_CLINIC_OR_DEPARTMENT_OTHER): Payer: Self-pay

## 2023-05-09 ENCOUNTER — Ambulatory Visit (HOSPITAL_BASED_OUTPATIENT_CLINIC_OR_DEPARTMENT_OTHER): Payer: Medicare Other

## 2023-05-09 DIAGNOSIS — M5459 Other low back pain: Secondary | ICD-10-CM | POA: Diagnosis not present

## 2023-05-09 DIAGNOSIS — R293 Abnormal posture: Secondary | ICD-10-CM

## 2023-05-09 DIAGNOSIS — M546 Pain in thoracic spine: Secondary | ICD-10-CM

## 2023-05-09 NOTE — Therapy (Addendum)
OUTPATIENT PHYSICAL THERAPY TREATMENT PHYSICAL THERAPY DISCHARGE SUMMARY  Visits from Start of Care: 8  Current functional level related to goals / functional outcomes: indep   Remaining deficits: none   Education / Equipment: Management of condition/HEP   Patient agrees to discharge. Patient goals were partially met. Patient is being discharged due to maximized rehab potential.   Addend Corrie Dandy Tomma Lightning) Ziemba MPT 09/01/23 1:19 PM [facility] [address] [place], [place], [address] Phone: [phone]   Fax:  [phone]    Patient Name: Beverly [name] MRN: [id number] DOB:05/17/1946, 77 y.o., female Today's Date: 05/09/2023  END OF SESSION:  PT End of Session - 05/09/23 1409     Visit Number 8    Number of Visits 8    Date for PT Re-Evaluation 04/28/23    Authorization Type Medicare    PT Start Time 1348    PT Stop Time 1430    PT Time Calculation (min) 42 min    Activity Tolerance Patient tolerated treatment well    Behavior During Therapy [facility] for tasks assessed/performed                Past Medical History:  Diagnosis Date   Allergic rhinitis    Allergy    Anemia    Asthma    Cataract    bilateral cataracts removed   Chronic cough    Esophageal reflux    IBS (irritable bowel syndrome)    Osteopenia 03/14/2013   DEXA @LB  02/2013: -1.8   Past Surgical History:  Procedure Laterality Date   bilateral cateracts removed     BREAST LUMPECTOMY Left    FOOT SURGERY     right   LAPAROSCOPIC CHOLECYSTECTOMY  11/2009   right foot bunionectomy     TUBAL LIGATION     Patient Active Problem List   Diagnosis Date Noted   Dysfunctional uterine bleeding 04/27/2023   Menopausal problem 04/27/2023   Degenerative disc disease, cervical 12/19/2019   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   Nonallopathic lesion of lumbosacral region 03/13/2014   Nonallopathic lesion of sacral region 03/13/2014    Nonallopathic lesion of thoracic region 03/13/2014   Osteopenia 03/14/2013   Postmenopausal disorder 02/15/2013   Lumbago 05/23/2012   Irritable bowel syndrome 04/08/2008   ALLERGIC RHINITIS 06/29/2007   Esophageal reflux 06/29/2007   Cough 06/29/2007    PCP: Dr Elesa Massed  REFERRING PROVIDER: Dr Antoine Primas DO   REFERRING DIAG:  Diagnosis  M54.50 (ICD-10-CM) - Lumbar spine pain    Rationale for Evaluation and Treatment: Rehabilitation  THERAPY DIAG:  Other low back pain  Pain in thoracic spine  Abnormal posture  ONSET DATE:   SUBJECTIVE:  SUBJECTIVE STATEMENT: Pt reports she has been doing better.   EVAL: Patient has a long history of low back pain and midthoracic pain.  She was diagnosed with scoliosis.  She also has a history of osteopenia.  She reports over the past few years she is lost 2 inches of height.  She has increased pain when she stands for too long and with rotational activity.  She has increased pain rotating to the right.  She feels like the pain can be more in her left rib cage at times and also in her right lower back.  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  PERTINENT HISTORY:  Osteopenia, bilateral cataracts; right foot bunionectomy,    PAIN:  Are you having pain? Yes: NPRS scale: 3-4/10 Pain location: Can be left or right depending on the day Pain description: discomfort, stiffness Aggravating factors: turning to the left and right  Relieving factors: tylenol   PRECAUTIONS: None  RED FLAGS: None   WEIGHT BEARING RESTRICTIONS: No  FALLS:  Has patient fallen in last 6 months? No  LIVING ENVIRONMENT: Has steps but no issues OCCUPATION:  Consulting civil engineer and painting   Hobbies:  Yoga,  Painting Horseback riding      PLOF: Independent  PATIENT GOALS: To develop a plan to reduce pain and prevent further increase in pain  NEXT MD VISIT: When she is done PT for a few weeks  OBJECTIVE:   DIAGNOSTIC FINDINGS:    PATIENT SURVEYS:  FOTO    SCREENING FOR RED FLAGS: Bowel or bladder incontinence: No Spinal tumors: No Cauda equina syndrome: No Compression fracture: No Abdominal aneurysm: No  COGNITION: Overall cognitive status: Within functional limits for tasks assessed     SENSATION: WFL   POSTURE: Good posture  PALPATION: Tender to palpation in the right QL and left lower mid thoracic paraspinals and into her left rib cage  LUMBAR ROM:   AROM eval  Flexion   Extension   Right lateral flexion   Left lateral flexion   Right rotation Pain in right shoulder blade   Left rotation Less pain but pain in the left shoulder blade   (Blank rows = not tested)   LOWER EXTREMITY MMT:    MMT Right eval Left eval Right  Left   Hip flexion 17.5 15.5 24.8 28.7  Hip extension      Hip abduction 16.2 16.3 23.2 20.6  Hip adduction      Hip internal rotation      Hip external rotation      Knee flexion      Knee extension 23.9 19.2 38.7 39.0  Ankle dorsiflexion      Ankle plantarflexion      Ankle inversion      Ankle eversion       (Blank rows = not tested)  GAIT: No gait deviations;    TODAY'S TREATMENT:  DATE:   10/28 Cables:   Shoulder extension 10 lbs 2x12 Row 2x12 15 lbs  Pallof press 2x10 each side 5 lbs  Shoulder adduction 5lbs x10ea Bent over row 6-8# x10ea bil  Standing H abduction RTB 2x10 Standing bil ER RTB 2x10  10/17 Reviewed strength, ROM and FOTO.   Cables:   Shoulder extension 10 lbs 2x12 Row 2x12 15 lbs  Pallof press 2x10 each side 5 lbs   Reviewed how to use her HEP       PATIENT EDUCATION:  Education  details: HEP, symptom management, 03/09/23: HEP, weightbearing exercises and resistance training for bone density Person educated: Patient Education method: Explanation, Demonstration, Tactile cues, Verbal cues, and Handouts Education comprehension: verbalized understanding, returned demonstration, verbal cues required, tactile cues required, and needs further education  HOME EXERCISE PROGRAM: Access Code: [email or web address] URL: [email or web address]  Date: 03/03/2023 - Sidelying Open Book Thoracic Lumbar Rotation and Extension  - 1 x daily - 7 x weekly - 1 sets - 10 reps - Theracane Over Shoulder  - 1 x daily - 7 x weekly - 3 sets - 1-56min  hold - Standing Bilateral Low Shoulder Row with Anchored Resistance  - 1 x daily - 7 x weekly - 3 sets - 10 reps - Standing Glute Med Mobilization with Small Ball on Wall  - 1 x daily - 7 x weekly - 3 sets - 10 reps  03/09/23- Lateral Shift Correction at Wall  - 1 x daily - 7 x weekly - 3 reps - 30 second hold - Shoulder extension with resistance - Neutral  - 1 x daily - 7 x weekly - 2 sets - 10 reps - Shoulder External Rotation and Scapular Retraction with Resistance  - 1 x daily - 7 x weekly - 2 sets - 10 reps  03/23/23- - Swiss Ball March  - 1 x daily - 7 x weekly - 2 sets - 10 reps - Swiss Ball Knee Extension  - 1 x daily - 7 x weekly - 2 sets - 10 reps - Seated March with Opposite Arm Flexion on Swiss Ball  - 1 x daily - 7 x weekly - 2 sets - 10 reps - Shoulder extension with resistance - Neutral  - 1 x daily - 7 x weekly - 2 sets - 10 reps - Standing Anti-Rotation Press with Anchored Resistance  - 1 x daily - 7 x weekly - 2 sets - 10 reps - Sit to Stand with Arms Crossed  - 1 x daily - 7 x weekly - 2 sets - 10 reps  ASSESSMENT:  CLINICAL IMPRESSION: Pt has attended 8 visits of PT thus far and had made both subjective and objective improvements. Pt has met 1/3 STG and 2/3 LTG, progressed towards all other goals compared to at IE. Pt is  appropriate for DC to HEP and gym program at this time. Instructed pt in gym based exercises today that she can incorporate into regular routine.   OBJECTIVE IMPAIRMENTS: decreased activity tolerance, decreased ROM, increased fascial restrictions, and pain.   ACTIVITY LIMITATIONS: sitting, standing, and locomotion level  PARTICIPATION LIMITATIONS: community activity and occupation  PERSONAL FACTORS: 1-2 comorbidities: osteopenia scoliosis   are also affecting patient's functional outcome.   REHAB POTENTIAL: Excellent  CLINICAL DECISION MAKING: Stable/uncomplicated  EVALUATION COMPLEXITY: Low   GOALS: Goals reviewed with patient? Yes  SHORT TERM GOALS: Target date: 03/31/2023    Patient will perform right rotation without increased pain Baseline: Goal status: No pain  achieved 10/17   2.  Patient will increase gross bilateral lower extremity strength by 5 pounds Baseline:  Goal status: IN PROGRESS (stronger) 10/17  3.  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  Patient will be independent with complete exercise program to continue to improve posture Baseline:  Goal status: MET 10/28  3.  Patient will perform rotational activity in her home without increased pain in order to perform ADLs Baseline:  Goal status: MET 10/28   PLAN:  PT FREQUENCY: 1-2x/week  PT DURATION: 8 weeks  PLANNED INTERVENTIONS: Therapeutic exercises, Therapeutic activity, Neuromuscular re-education, Balance training, Gait training, Patient/Family education, Self Care, Joint mobilization, Stair training, DME instructions, Aquatic Therapy, Dry Needling, Electrical stimulation, Cryotherapy, Moist heat, Taping, Manual therapy, and  Re-evaluation.   PLAN FOR NEXT SESSION:  D/C today   Lenore Manner, PTA 05/09/2023, 3:05 PM   Therapist reviewed DC with PTA and agrees with plan. [address] [place] [address] [place]) Ziemba MPT 09/01/23 1:20 PM [facility] [address] [place], [place], [address] Phone: [phone]   Fax:  [phone]

## 2023-05-16 ENCOUNTER — Other Ambulatory Visit (HOSPITAL_BASED_OUTPATIENT_CLINIC_OR_DEPARTMENT_OTHER): Payer: Self-pay

## 2023-05-16 ENCOUNTER — Other Ambulatory Visit (HOSPITAL_COMMUNITY): Payer: Self-pay

## 2023-05-16 MED ORDER — BREZTRI AEROSPHERE 160-9-4.8 MCG/ACT IN AERO
2.0000 | INHALATION_SPRAY | Freq: Two times a day (BID) | RESPIRATORY_TRACT | 1 refills | Status: DC
  Filled 2023-06-29 (×2): qty 32.1, 30d supply, fill #0
  Filled 2023-09-05: qty 32.1, 90d supply, fill #0

## 2023-05-16 MED ORDER — FLUTICASONE PROPIONATE 50 MCG/ACT NA SUSP
2.0000 | Freq: Every day | NASAL | 1 refills | Status: DC
  Filled 2023-11-09 – 2023-11-12 (×3): qty 48, 90d supply, fill #0

## 2023-05-16 MED ORDER — FAMOTIDINE 40 MG PO TABS
40.0000 mg | ORAL_TABLET | Freq: Every day | ORAL | 1 refills | Status: DC
  Filled 2023-08-01 (×2): qty 90, 90d supply, fill #0

## 2023-05-16 MED ORDER — ESTRADIOL-NORETHINDRONE ACET 0.5-0.1 MG PO TABS
1.0000 | ORAL_TABLET | Freq: Every day | ORAL | 3 refills | Status: DC
  Filled 2023-09-02: qty 28, 28d supply, fill #0

## 2023-05-17 ENCOUNTER — Other Ambulatory Visit (HOSPITAL_BASED_OUTPATIENT_CLINIC_OR_DEPARTMENT_OTHER): Payer: Self-pay

## 2023-05-21 ENCOUNTER — Other Ambulatory Visit (HOSPITAL_BASED_OUTPATIENT_CLINIC_OR_DEPARTMENT_OTHER): Payer: Self-pay

## 2023-05-21 MED FILL — Dexlansoprazole Cap Delayed Release 60 MG: ORAL | 90 days supply | Qty: 90 | Fill #0 | Status: AC

## 2023-05-23 ENCOUNTER — Encounter: Payer: Self-pay | Admitting: Internal Medicine

## 2023-05-30 NOTE — Progress Notes (Signed)
[name] [address] [place] [address] Phone: [phone] Subjective:   Beverly [name], am serving as a scribe for Dr. Antoine Primas.  I'm seeing this patient by the request  of:  Combs, Prince Solian, DO  CC: Back and neck pain  JQB:[id number]  Beverly [name] is a 77 y.o. female coming in with complaint of back and neck pain. OMT on 04/26/2023. Patient states same per usual. No new concerns.  Medications patient has been prescribed:   Taking:         Reviewed prior external information including notes and imaging from previsou exam, outside providers and external EMR if available.   As well as notes that were available from care everywhere and other healthcare systems.  Past medical history, social, surgical and family history all reviewed in electronic medical record.  No pertanent information unless stated regarding to the chief complaint.   Past Medical History:  Diagnosis Date   Allergic rhinitis    Allergy    Anemia    Asthma    Cataract    bilateral cataracts removed   Chronic cough    Esophageal reflux    IBS (irritable bowel syndrome)    Osteopenia 03/14/2013   DEXA @LB  02/2013: -1.8    No Known Allergies   Review of Systems:  No headache, visual changes, nausea, vomiting, diarrhea, constipation, dizziness, abdominal pain, skin rash, fevers, chills, night sweats, weight loss, swollen lymph nodes, body aches, joint swelling, chest pain, shortness of breath, mood changes. POSITIVE muscle aches  Objective  Blood pressure 116/74, pulse 89, height 5\' 3"  (1.6 m), weight 128 lb (58.1 kg), SpO2 90%.   General: No apparent distress alert and oriented x3 mood and affect normal, dressed appropriately.  HEENT: Pupils equal, extraocular movements intact  Respiratory: Patient's speak in full sentences and does not appear short of breath  Cardiovascular: No lower extremity edema, non tender, no erythema  Gait MSK:  Back  does have some loss of lordosis.  Some degenerative scoliosis noted.  Patient does have some hypertonicity of some of the muscles.  Limited range of motion of the neck noted as well.  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  Does have the hypertonicity as well as the severe scoliosis noted.  Will continue to work on staying active.  Patient will continue work on core strengthening.  No significant drastic changes at the moment.  Follow-up again in 6 to 8 weeks    Nonallopathic problems  Decision today to treat with OMT was based on Physical Exam  After verbal consent patient was treated with HVLA, ME, FPR techniques in cervical, rib, thoracic, lumbar, and sacral  areas  Patient tolerated the procedure well with improvement in symptoms  Patient given exercises, stretches and lifestyle modifications  See medications in patient instructions if given  Patient will follow up in 4-8 weeks     The above documentation has been reviewed and is accurate and complete [name], DO         Note: This dictation was prepared with Dragon dictation along with smaller phrase technology. Any transcriptional errors that result from this  process are unintentional.

## 2023-05-31 ENCOUNTER — Ambulatory Visit (INDEPENDENT_AMBULATORY_CARE_PROVIDER_SITE_OTHER): Payer: Medicare Other | Admitting: Family Medicine

## 2023-05-31 VITALS — BP 116/74 | HR 89 | Ht 63.0 in | Wt 128.0 lb

## 2023-05-31 DIAGNOSIS — M9901 Segmental and somatic dysfunction of cervical region: Secondary | ICD-10-CM | POA: Diagnosis not present

## 2023-05-31 DIAGNOSIS — M533 Sacrococcygeal disorders, not elsewhere classified: Secondary | ICD-10-CM

## 2023-05-31 DIAGNOSIS — M9904 Segmental and somatic dysfunction of sacral region: Secondary | ICD-10-CM

## 2023-05-31 DIAGNOSIS — M9908 Segmental and somatic dysfunction of rib cage: Secondary | ICD-10-CM | POA: Diagnosis not present

## 2023-05-31 DIAGNOSIS — M9903 Segmental and somatic dysfunction of lumbar region: Secondary | ICD-10-CM | POA: Diagnosis not present

## 2023-05-31 DIAGNOSIS — M9902 Segmental and somatic dysfunction of thoracic region: Secondary | ICD-10-CM

## 2023-05-31 NOTE — Patient Instructions (Addendum)
[facility] [address] You are doing great See me in 6-8 weeks

## 2023-06-01 ENCOUNTER — Encounter: Payer: Self-pay | Admitting: Family Medicine

## 2023-06-01 NOTE — Assessment & Plan Note (Signed)
Continues to have a chronic problem.  Does have the hypertonicity as well as the severe scoliosis noted.  Will continue to work on staying active.  Patient will continue work on core strengthening.  No significant drastic changes at the moment.  Follow-up again in 6 to 8 weeks

## 2023-06-02 ENCOUNTER — Other Ambulatory Visit (HOSPITAL_BASED_OUTPATIENT_CLINIC_OR_DEPARTMENT_OTHER): Payer: Self-pay

## 2023-06-02 ENCOUNTER — Other Ambulatory Visit (HOSPITAL_COMMUNITY): Payer: Self-pay

## 2023-06-10 ENCOUNTER — Other Ambulatory Visit (HOSPITAL_BASED_OUTPATIENT_CLINIC_OR_DEPARTMENT_OTHER): Payer: Self-pay

## 2023-06-29 ENCOUNTER — Other Ambulatory Visit (HOSPITAL_BASED_OUTPATIENT_CLINIC_OR_DEPARTMENT_OTHER): Payer: Self-pay

## 2023-06-29 ENCOUNTER — Telehealth: Payer: Self-pay | Admitting: Allergy and Immunology

## 2023-06-29 ENCOUNTER — Other Ambulatory Visit: Payer: Self-pay

## 2023-06-29 ENCOUNTER — Other Ambulatory Visit (HOSPITAL_COMMUNITY): Payer: Self-pay

## 2023-06-29 NOTE — Telephone Encounter (Signed)
Beverly [name] came into the office and stated that she was trying to get refills of Breztri and the pharmacist told her to come to [place] and ask [place] to call the insurance and approve her medication.  I informed Beverly [name] they were asking for a prior authorization.  Beverly [name] states she was unsure how she even got into this situation where a prior Berkley Harvey was needed.  I explained to her about formulary and non formulary and trying medications and failing them and she still couldn't understand.  I called Beverly [name] to see if she happened to come across a prior authorization and she stated there was one that had been faxed onto on[place].  The prior Berkley Harvey is currently in on[employer].  Beverly [name] was concerned about how long the prior auth's take because she didn't have much left in her Breztri inhaler.  I provided Beverly [name] with 2 Breztri samples to hold her over until she gets confirmation of her approval.

## 2023-06-29 NOTE — Telephone Encounter (Signed)
Forwarding message to PA Team.

## 2023-06-30 ENCOUNTER — Other Ambulatory Visit (HOSPITAL_BASED_OUTPATIENT_CLINIC_OR_DEPARTMENT_OTHER): Payer: Self-pay

## 2023-06-30 ENCOUNTER — Other Ambulatory Visit (HOSPITAL_COMMUNITY): Payer: Self-pay

## 2023-06-30 NOTE — Telephone Encounter (Signed)
Called patient - DOB/DPR verified - advised of below notation.  Patient verbalized understanding to all, no further questions.

## 2023-06-30 NOTE — Telephone Encounter (Signed)
Pharmacy Patient Advocate Encounter   Received notification from Pt Calls Messages that prior authorization for Breztri Aerosphere 160-9-4.8MCG/ACT aerosol  is required/requested.   Insurance verification completed.   The patient is insured through [employer] .   Per test claim: Refill too soon. PA is not needed at this time. Medication was filled 06/29/2023. Next eligible fill date is 07/21/2023.

## 2023-07-01 NOTE — Progress Notes (Signed)
[name] [address] [place] [address] Phone: [phone] Subjective:   [name], am serving as a scribe for Dr. Antoine Primas.  I'm seeing this patient by the request  of:  Combs, Prince Solian, DO  CC: Back and neck pain follow-up  ION:[id number]  Beverly [name] is a 77 y.o. female coming in with complaint of back and neck pain. OMT on 05/31/2023. Patient states that she is doing well. Managing the pain in her lumbar spine. Also having pain in L shoulder and scapula.   Medications patient has been prescribed:   Taking:      Reviewed prior external information including notes and imaging from previsou exam, outside providers and external EMR if available.   As well as notes that were available from care everywhere and other healthcare systems.  Past medical history, social, surgical and family history all reviewed in electronic medical record.  No pertanent information unless stated regarding to the chief complaint.   Past Medical History:  Diagnosis Date   Allergic rhinitis    Allergy    Anemia    Asthma    Cataract    bilateral cataracts removed   Chronic cough    Esophageal reflux    IBS (irritable bowel syndrome)    Osteopenia 03/14/2013   DEXA @LB  02/2013: -1.8    No Known Allergies   Review of Systems:  No headache, visual changes, nausea, vomiting, diarrhea, constipation, dizziness, abdominal pain, skin rash, fevers, chills, night sweats, weight loss, swollen lymph nodes, body aches, joint swelling, chest pain, shortness of breath, mood changes. POSITIVE muscle aches  Objective  Blood pressure 102/72, pulse 71, height 5\' 3"  (1.6 m), weight 125 lb (56.7 kg).   General: No apparent distress alert and oriented x3 mood and affect normal, dressed appropriately.  HEENT: Pupils equal, extraocular movements intact  Respiratory: Patient's speak in full sentences and does not appear short of breath  Cardiovascular: No  lower extremity edema, non tender, no erythema  Gait MSK:  Back does have some loss lordosis noted.  Significant scoliosis noted of the lumbar spine.  Patient does have tightness noted.  Tightness in the hips bilaterally.  Osteopathic findings  T9 extended rotated and side bent left L1-L5 neutral side bent left rotated right Sacrum right on right       Assessment and Plan:  SI (sacroiliac) joint dysfunction Significant scoliosis noted.  Discussed icing regimen and home exercises, discussed which activities to do and which ones to avoid.  Discussed strengthening exercises that I think will be more beneficial.  Discussed home exercises routines as well as when going to a gym or what type of machines could be more beneficial.  Increase activity slowly.  Follow-up again in 6 to 8 weeks    Nonallopathic problems  Decision today to treat with OMT was based on Physical Exam  After verbal consent patient was treated with HVLA, ME, FPR techniques in  thoracic, lumbar, and sacral  areas  Patient tolerated the procedure well with improvement in symptoms  Patient given exercises, stretches and lifestyle modifications  See medications in patient instructions if given  Patient will follow up in 4-8 weeks     The above documentation has been reviewed and is accurate and complete [name], DO         Note: This dictation was prepared with Dragon dictation along with smaller phrase technology. Any transcriptional errors that result from this process are unintentional.

## 2023-07-08 ENCOUNTER — Ambulatory Visit (INDEPENDENT_AMBULATORY_CARE_PROVIDER_SITE_OTHER): Payer: Medicare Other | Admitting: Family Medicine

## 2023-07-08 ENCOUNTER — Encounter: Payer: Self-pay | Admitting: Family Medicine

## 2023-07-08 VITALS — BP 102/72 | HR 71 | Ht 63.0 in | Wt 125.0 lb

## 2023-07-08 DIAGNOSIS — M9902 Segmental and somatic dysfunction of thoracic region: Secondary | ICD-10-CM | POA: Diagnosis not present

## 2023-07-08 DIAGNOSIS — M9903 Segmental and somatic dysfunction of lumbar region: Secondary | ICD-10-CM

## 2023-07-08 DIAGNOSIS — M533 Sacrococcygeal disorders, not elsewhere classified: Secondary | ICD-10-CM

## 2023-07-08 DIAGNOSIS — M9904 Segmental and somatic dysfunction of sacral region: Secondary | ICD-10-CM | POA: Diagnosis not present

## 2023-07-08 NOTE — Assessment & Plan Note (Signed)
Significant scoliosis noted.  Discussed icing regimen and home exercises, discussed which activities to do and which ones to avoid.  Discussed strengthening exercises that I think will be more beneficial.  Discussed home exercises routines as well as when going to a gym or what type of machines could be more beneficial.  Increase activity slowly.  Follow-up again in 6 to 8 weeks

## 2023-07-08 NOTE — Patient Instructions (Signed)
Good to see you [email or web address] I do think going to [facility] would be helpful See me in 2 months

## 2023-07-28 ENCOUNTER — Ambulatory Visit: Payer: Medicare Other | Admitting: Dermatology

## 2023-08-01 ENCOUNTER — Other Ambulatory Visit (HOSPITAL_BASED_OUTPATIENT_CLINIC_OR_DEPARTMENT_OTHER): Payer: Self-pay

## 2023-08-02 ENCOUNTER — Other Ambulatory Visit: Payer: Self-pay

## 2023-08-02 ENCOUNTER — Other Ambulatory Visit (HOSPITAL_BASED_OUTPATIENT_CLINIC_OR_DEPARTMENT_OTHER): Payer: Self-pay

## 2023-08-15 ENCOUNTER — Other Ambulatory Visit (HOSPITAL_BASED_OUTPATIENT_CLINIC_OR_DEPARTMENT_OTHER): Payer: Self-pay

## 2023-08-15 ENCOUNTER — Other Ambulatory Visit: Payer: Self-pay | Admitting: Allergy and Immunology

## 2023-08-15 MED ORDER — DEXLANSOPRAZOLE 60 MG PO CPDR
60.0000 mg | DELAYED_RELEASE_CAPSULE | Freq: Every morning | ORAL | 0 refills | Status: DC
  Filled 2023-08-15: qty 30, 30d supply, fill #0

## 2023-08-17 ENCOUNTER — Other Ambulatory Visit (HOSPITAL_BASED_OUTPATIENT_CLINIC_OR_DEPARTMENT_OTHER): Payer: Self-pay

## 2023-08-19 ENCOUNTER — Telehealth: Payer: Self-pay | Admitting: Allergy and Immunology

## 2023-08-19 ENCOUNTER — Other Ambulatory Visit (HOSPITAL_BASED_OUTPATIENT_CLINIC_OR_DEPARTMENT_OTHER): Payer: Self-pay

## 2023-08-19 MED ORDER — DEXLANSOPRAZOLE 60 MG PO CPDR
60.0000 mg | DELAYED_RELEASE_CAPSULE | Freq: Every morning | ORAL | 0 refills | Status: DC
  Filled 2023-08-19 – 2023-09-22 (×2): qty 30, 30d supply, fill #0

## 2023-08-19 NOTE — Addendum Note (Signed)
 Addended by: Belva Boyden on: 08/19/2023 10:37 AM   Modules accepted: Orders

## 2023-08-19 NOTE — Telephone Encounter (Signed)
 Patient stated she needs a refill for dexlansoprazole  (DEXILANT ) 60 MG capsule. Patient stated she wants it sent over to [facility]  [address]  Best Contact: [phone]

## 2023-08-19 NOTE — Telephone Encounter (Signed)
 Did send in curtesy refill pt needs an appt before any more refills are given

## 2023-08-24 NOTE — Progress Notes (Unsigned)
  [name] [address] [place] [address] Phone: [phone] Subjective:   [name], am serving as a scribe for Dr. Antoine Primas.  I'm seeing this patient by the request  of:  Combs, Prince Solian, DO  CC: Multiple joint complaints  [id number]  ORIS CALMES is a 78 y.o. female coming in with complaint of back and neck pain. OMT on 07/08/2023. Patient states that upper R scapula is tight and painful. Overall doing ok.   Medications patient has been prescribed:   Taking:         Reviewed prior external information including notes and imaging from previsou exam, outside providers and external EMR if available.   As well as notes that were available from care everywhere and other healthcare systems.  Past medical history, social, surgical and family history all reviewed in electronic medical record.  No pertanent information unless stated regarding to the chief complaint.   Past Medical History:  Diagnosis Date   Allergic rhinitis    Allergy    Anemia    Asthma    Cataract    bilateral cataracts removed   Chronic cough    Esophageal reflux    IBS (irritable bowel syndrome)    Osteopenia 03/14/2013   DEXA @LB  02/2013: -1.8    No Known Allergies   Review of Systems:  No headache, visual changes, nausea, vomiting, diarrhea, constipation, dizziness, abdominal pain, skin rash, fevers, chills, night sweats, weight loss, swollen lymph nodes, body aches, joint swelling, chest pain, shortness of breath, mood changes. POSITIVE muscle aches  Objective  Blood pressure 122/78, pulse 67, height 5\' 3"  (1.6 m), weight 126 lb (57.2 kg), SpO2 96%.   General: No apparent distress alert and oriented x3 mood and affect normal, dressed appropriately.  HEENT: Pupils equal, extraocular movements intact  Respiratory: Patient's speak in full sentences and does not appear short of breath  Cardiovascular: No lower extremity edema, non  tender, no erythema  Gait mild difficulty with gait. MSK:  Back does have some loss lordosis noted.  Some tenderness to palpation in the paraspinal musculature.  Tightness noted with FABER left greater than right.  Osteopathic findings T9 extended rotated and side bent left L2 flexed rotated and side bent right Sacrum right on right       Assessment and Plan:  Degenerative disc disease, cervical Significant tightness noted in the neck as well as in the parascapular area.  Discussed with patient about icing regimen and home exercises, which activities to do and which ones to avoid.  Increase activity slowly otherwise.  Follow-up with me again in 6 to 8 weeks otherwise.    Nonallopathic problems  Decision today to treat with OMT was based on Physical Exam  After verbal consent patient was treated with HVLA, ME, FPR techniques in cervical, rib, thoracic, lumbar, and sacral  areas  Patient tolerated the procedure well with improvement in symptoms  Patient given exercises, stretches and lifestyle modifications  See medications in patient instructions if given  Patient will follow up in 4-8 weeks     The above documentation has been reviewed and is accurate and complete [name], DO         Note: This dictation was prepared with Dragon dictation along with smaller phrase technology. Any transcriptional errors that result from this process are unintentional.

## 2023-08-25 ENCOUNTER — Ambulatory Visit: Payer: Medicare Other | Admitting: Family Medicine

## 2023-08-25 VITALS — BP 122/78 | HR 67 | Ht 63.0 in | Wt 126.0 lb

## 2023-08-25 DIAGNOSIS — M9901 Segmental and somatic dysfunction of cervical region: Secondary | ICD-10-CM

## 2023-08-25 DIAGNOSIS — M9908 Segmental and somatic dysfunction of rib cage: Secondary | ICD-10-CM | POA: Diagnosis not present

## 2023-08-25 DIAGNOSIS — M9904 Segmental and somatic dysfunction of sacral region: Secondary | ICD-10-CM

## 2023-08-25 DIAGNOSIS — M5030 Other cervical disc degeneration, unspecified cervical region: Secondary | ICD-10-CM | POA: Diagnosis not present

## 2023-08-25 DIAGNOSIS — M9902 Segmental and somatic dysfunction of thoracic region: Secondary | ICD-10-CM | POA: Diagnosis not present

## 2023-08-25 DIAGNOSIS — M9903 Segmental and somatic dysfunction of lumbar region: Secondary | ICD-10-CM | POA: Diagnosis not present

## 2023-08-25 NOTE — Assessment & Plan Note (Signed)
Significant tightness noted in the neck as well as in the parascapular area.  Discussed with patient about icing regimen and home exercises, which activities to do and which ones to avoid.  Increase activity slowly otherwise.  Follow-up with me again in 6 to 8 weeks otherwise.

## 2023-08-25 NOTE — Patient Instructions (Signed)
Husband has to share candy and change positions on couch Tennis ball on the floor  See me in 7-8 weeks

## 2023-09-02 ENCOUNTER — Other Ambulatory Visit: Payer: Self-pay

## 2023-09-02 ENCOUNTER — Other Ambulatory Visit (HOSPITAL_BASED_OUTPATIENT_CLINIC_OR_DEPARTMENT_OTHER): Payer: Self-pay

## 2023-09-05 ENCOUNTER — Other Ambulatory Visit (HOSPITAL_BASED_OUTPATIENT_CLINIC_OR_DEPARTMENT_OTHER): Payer: Self-pay

## 2023-09-13 ENCOUNTER — Encounter: Payer: Self-pay | Admitting: Allergy and Immunology

## 2023-09-13 ENCOUNTER — Ambulatory Visit (INDEPENDENT_AMBULATORY_CARE_PROVIDER_SITE_OTHER): Payer: Medicare Other | Admitting: Allergy and Immunology

## 2023-09-13 ENCOUNTER — Other Ambulatory Visit: Payer: Self-pay

## 2023-09-13 VITALS — BP 130/72 | HR 66 | Temp 97.8°F | Resp 16 | Ht 63.0 in | Wt 123.0 lb

## 2023-09-13 DIAGNOSIS — K219 Gastro-esophageal reflux disease without esophagitis: Secondary | ICD-10-CM

## 2023-09-13 DIAGNOSIS — J4540 Moderate persistent asthma, uncomplicated: Secondary | ICD-10-CM

## 2023-09-13 DIAGNOSIS — M8589 Other specified disorders of bone density and structure, multiple sites: Secondary | ICD-10-CM

## 2023-09-13 DIAGNOSIS — J3089 Other allergic rhinitis: Secondary | ICD-10-CM

## 2023-09-13 DIAGNOSIS — T63481A Toxic effect of venom of other arthropod, accidental (unintentional), initial encounter: Secondary | ICD-10-CM

## 2023-09-13 DIAGNOSIS — T63481D Toxic effect of venom of other arthropod, accidental (unintentional), subsequent encounter: Secondary | ICD-10-CM

## 2023-09-13 NOTE — Progress Notes (Unsigned)
  - [place] [facility] [place] [facility] [place] [facility]   Follow-up Note  Referring Provider: Theodis Shove, * Primary Provider: Theodis Shove, DO Date of Office Visit: 09/13/2023  Subjective:   Beverly [name] (DOB: February 17, 1946) is a 78 y.o. female who [facility] in re-evaluation of the following:  HPI: Beverly [name] returns to this clinic in evaluation of asthma, allergic rhinitis, LPR, osteopenia, history of large local reaction to hymenoptera venom exposure.  I last saw her in this clinic 08 March 2023.  Her asthma has really done well and her cough has basically abated she has been consistently using her Breztri twice a day and also adding in famotidine to her proton pump inhibitor.  But unfortunately she has developed a little bit of raspy voice while utilizing her Breztri twice a day.  Rarely does use the short acting bronchodilator and she can exercise without any problem.  She has not had any classic reflux symptoms.  She continues on alendronate for her osteopenia/osteoporosis  Allergies as of 09/13/2023   No Known Allergies      Medication List    albuterol 108 (90 Base) MCG/ACT inhaler Commonly known as: Ventolin HFA Inhale 2 puffs into the lungs every 4 (four) hours as needed for wheezing or shortness of breath.   alendronate 70 MG tablet Commonly known as: Fosamax Take 1 tablet (70 mg total) by mouth once a week. Take with a full glass of water on an empty stomach.   Arexvy 120 MCG/0.5ML injection Generic drug: RSV vaccine recomb adjuvanted Inject into the muscle.   Breztri Aerosphere 160-9-4.8 MCG/ACT Aero Generic drug: Budeson-Glycopyrrol-Formoterol Inhale 2 puffs into the lungs in the morning and at bedtime.   Breztri Aerosphere 160-9-4.8 MCG/ACT Aero Generic drug: Budeson-Glycopyrrol-Formoterol Inhale 2 puffs into the lungs in the morning and at bedtime.   calcium carbonate 100 mg/ml Susp Take 1  tablet by mouth daily at 12 noon.   cetirizine 10 MG tablet Commonly known as: ZYRTEC Take 1 tablet (10 mg total) by mouth daily as needed for allergies (Can take an extra dose during flare ups.).   dexlansoprazole 60 MG capsule Commonly known as: DEXILANT TAKE 1 CAPSULE (60 MG TOTAL) BY MOUTH IN THE MORNING   EPINEPHrine 0.3 mg/0.3 mL Soaj injection Commonly known as: EPI-PEN Inject 0.3 mg into the muscle as needed for anaphylaxis.   Estradiol-Norethindrone Acet 0.5-0.1 MG tablet Take 1 tablet by mouth every other day.   Estradiol-Norethindrone Acet 0.5-0.1 MG tablet Take 1 tablet by mouth daily.   famotidine 40 MG tablet Commonly known as: PEPCID Take 1 tablet (40 mg total) by mouth at bedtime.   famotidine 40 MG tablet Commonly known as: PEPCID Take 1 tablet (40 mg total) by mouth at bedtime.   Fluad 0.5 ML injection Generic drug: influenza vaccine adjuvanted Inject 0.5 mLs into the muscle once. 2018   Fluad Quadrivalent 0.5 ML injection Generic drug: influenza vaccine adjuvanted Inject into the muscle.   fluticasone 50 MCG/ACT nasal spray Commonly known as: FLONASE Place 2 sprays into both nostrils daily.   fluticasone 50 MCG/ACT nasal spray Commonly known as: FLONASE Place 2 sprays into both nostrils daily.   IRON CR PO   multivitamin tablet Take 1 tablet by mouth daily.   Pfizer COVID-19 Vac Bivalent injection Generic drug: COVID-19 mRNA bivalent vaccine Proofreader) Inject into the muscle.   Pfizer-BioNT COVID-19 Vac-TriS Susp injection Generic drug: COVID-19 mRNA Vac-TriS Proofreader) Inject into  the muscle.   Comirnaty syringe Generic drug: COVID-19 mRNA vaccine (Pfizer) Inject into the muscle.   Comirnaty syringe Generic drug: COVID-19 mRNA vaccine (Pfizer) Inject 0.3 mLs into the muscle.   Comirnaty syringe Generic drug: COVID-19 mRNA vaccine (Pfizer) Inject into the muscle.   Spacer/Aero-Holding [employer] 1 Device by Does not apply route  as directed.   VITAMIN D BOOSTER PO Take 1 tablet by mouth daily at 12 noon.     Past Medical History:  Diagnosis Date   Allergic rhinitis    Allergy    Anemia    Asthma    Cataract    bilateral cataracts removed   Chronic cough    DDD (degenerative disc disease), cervical    Esophageal reflux    IBS (irritable bowel syndrome)    Osteopenia 03/14/2013   DEXA @[facility]  02/2013: -1.8    Past Surgical History:  Procedure Laterality Date   bilateral cateracts removed     BREAST LUMPECTOMY Left    FOOT SURGERY     right   LAPAROSCOPIC CHOLECYSTECTOMY  11/2009   right foot bunionectomy     TUBAL LIGATION       Review of systems negative except as noted in HPI / PMHx or noted below:  Review of Systems  Constitutional: Negative.   HENT: Negative.    Eyes: Negative.   Respiratory: Negative.    Cardiovascular: Negative.   Gastrointestinal: Negative.   Genitourinary: Negative.   Musculoskeletal: Negative.   Skin: Negative.   Neurological: Negative.   Endo/Heme/Allergies: Negative.   Psychiatric/Behavioral: Negative.       Objective:   Vitals:   09/13/23 1613  BP: 130/72  Pulse: 66  Resp: 16  Temp: 97.8 F (36.6 C)  SpO2: 99%   Height: 5\' 3"  (160 cm)  Weight: 123 lb (55.8 kg)   Physical Exam Constitutional:      Appearance: She is not diaphoretic.  HENT:     Head: Normocephalic.     Right Ear: Tympanic membrane, ear canal and external ear normal.     Left Ear: Tympanic membrane, ear canal and external ear normal.     Nose: Nose normal. No mucosal edema or rhinorrhea.     Mouth/Throat:     Pharynx: Uvula midline. No oropharyngeal exudate.  Eyes:     Conjunctiva/sclera: Conjunctivae normal.  Neck:     Thyroid: No thyromegaly.     Trachea: Trachea normal. No tracheal tenderness or tracheal deviation.  Cardiovascular:     Rate and Rhythm: Normal rate and regular rhythm.     Heart sounds: Normal heart sounds, S1 normal and S2 normal. No murmur  heard. Pulmonary:     Effort: No respiratory distress.     Breath sounds: Normal breath sounds. No stridor. No wheezing or rales.  Lymphadenopathy:     Head:     Right side of head: No tonsillar adenopathy.     Left side of head: No tonsillar adenopathy.     Cervical: No cervical adenopathy.  Skin:    Findings: No erythema or rash.     Nails: There is no clubbing.  Neurological:     Mental Status: She is alert.     Diagnostics: Spirometry was performed and demonstrated an FEV1 of 1.85 at 96  % of predicted.  Assessment and Plan:   1. Asthma, moderate persistent, well-controlled   2. Other allergic rhinitis   3. LPRD (laryngopharyngeal reflux disease)   4. Osteopenia of multiple sites   5.  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 Continue Breztri - 2 inhalations 1-2 times per day w/ spacer      2. Continue Flonase - 1 spray each nostril 1-2 times per day    3. Continue to treat reflux:  A. Dexilant 60 mg 1 time a day in AM   B. Famotidine 40 mg - 1 tablet in evening  C. Minimize caffeine, chocolate, alcohol consumption  4. Continue to treat osteopenia / osteoporosis:   A. Alendronate 70 mg 1 time per week  5. If needed:  A. Zyrtec  B. Albuterol HFA C. Epi-Pen, Benadryl, MD/ER evaluation  6.  Influenza = Tamiflu.  COVID = Paxlovid.    7. Return to clinic in 6 months or earlier if problem   Rashel appears to be doing relatively well on her current plan of addressing issues with inflammation and reflux induced respiratory disease but she is developing some hoarseness as she has increased her inhaled steroid exposure had she can use a dose of Breztri that gives her good control of her lower airway disease but does not cause any hoarseness.  That may require her to use 1 inhalation once a day and 2 and elations on a alternate day and she will need to find the dose that works best for her as she moves forward.  She will continue to aggressively treat her LPR as noted  above.  Will see her back in this clinic in 6 months or earlier if there is a problem.   Laurette Schimke, MD Allergy / Immunology [facility]

## 2023-09-13 NOTE — Patient Instructions (Addendum)
  1. Continue Breztri - 2 inhalations 1-2 times per day w/ spacer      2. Continue Flonase - 1 spray each nostril 1-2 times per day    3. Continue to treat reflux:  A. Dexilant 60 mg 1 time a day in AM   B. Famotidine 40 mg - 1 tablet in evening  C. Minimize caffeine, chocolate, alcohol consumption  4. Continue to treat osteopenia / osteoporosis:   A. Alendronate 70 mg 1 time per week  5. If needed:  A. Zyrtec  B. Albuterol HFA C. Epi-Pen, Benadryl, MD/ER evaluation  6.  Influenza = Tamiflu.  COVID = Paxlovid.    7. Return to clinic in 6 months or earlier if problem

## 2023-09-14 ENCOUNTER — Encounter: Payer: Self-pay | Admitting: Allergy and Immunology

## 2023-09-15 ENCOUNTER — Ambulatory Visit: Payer: Medicare Other | Admitting: Internal Medicine

## 2023-09-22 ENCOUNTER — Other Ambulatory Visit (HOSPITAL_BASED_OUTPATIENT_CLINIC_OR_DEPARTMENT_OTHER): Payer: Self-pay

## 2023-10-12 NOTE — Progress Notes (Unsigned)
 [name] [address] [place] [address] Phone: [phone] Subjective:   Beverly [name], am serving as a scribe for Dr. Antoine Primas.  I'm seeing this patient by the request  of:  Combs, Prince Solian, DO  CC: Back and neck pain follow-up  NUU:[id number]  Beverly [name] is a 78 y.o. female coming in with complaint of back and neck pain. OMT 08/25/2023. Patient states same per usual. No new concerns or symptoms.  Patient has not been very active though.  He did not have to put her course of 30 years to down which has been very difficult and this was 10 days ago.  Medications patient has been prescribed: None  Taking:         Reviewed prior external information including notes and imaging from previsou exam, outside providers and external EMR if available.   As well as notes that were available from care everywhere and other healthcare systems.  Past medical history, social, surgical and family history all reviewed in electronic medical record.  No pertanent information unless stated regarding to the chief complaint.   Past Medical History:  Diagnosis Date   Allergic rhinitis    Allergy    Anemia    Asthma    Cataract    bilateral cataracts removed   Chronic cough    DDD (degenerative disc disease), cervical    Esophageal reflux    IBS (irritable bowel syndrome)    Osteopenia 03/14/2013   DEXA @[facility]  02/2013: -1.8    No Known Allergies   Review of Systems:  No headache, visual changes, nausea, vomiting, diarrhea, constipation, dizziness, abdominal pain, skin rash, fevers, chills, night sweats, weight loss, swollen lymph nodes, body aches, joint swelling, chest pain, shortness of breath, mood changes. POSITIVE muscle aches  Objective  Blood pressure 116/82, pulse 80, height 5\' 3"  (1.6 m), weight 124 lb (56.2 kg), SpO2 97%.   General: No apparent distress alert and oriented x3 mood and affect normal, dressed appropriately.   HEENT: Pupils equal, extraocular movements intact  Respiratory: Patient's speak in full sentences and does not appear short of breath  Cardiovascular: No lower extremity edema, non tender, no erythema  Gait MSK:  Back does have scoliosis noted of the upper back.  Patient does have significant tightness of the lower back.  Negative straight leg test noted.  Good core strength noted.  Tightness of all the musculature noted  Osteopathic findings  C2 flexed rotated and side bent right C6 flexed rotated and side bent left T3 extended rotated and side bent right inhaled rib T9 extended rotated and side bent left T10 extended rotated and side L2 flexed rotated and side bent right L3 flexed rotated and side bent left Sacrum right on right     Assessment and Plan:  SI (sacroiliac) joint dysfunction Arthritic changes.  Patient has not been taking care of herself secondary to some grief reaction.  We discussed with patient feels that she has good support around including daughter who came into town.  I discussed with patient about icing regimen and home exercises otherwise.  Increase activity slowly.  Follow-up with me again in 5 to 6 weeks.  No medication changes.    Nonallopathic problems  Decision today to treat with OMT was based on Physical Exam  After verbal consent patient was treated with HVLA, ME, FPR techniques in cervical, rib, thoracic, lumbar, and sacral  areas  Patient tolerated the procedure well with improvement in  symptoms  Patient given exercises, stretches and lifestyle modifications  See medications in patient instructions if given  Patient will follow up in 4-8 weeks    The above documentation has been reviewed and is accurate and complete [name], DO          Note: This dictation was prepared with Dragon dictation along with smaller phrase technology. Any transcriptional errors that result from this process are unintentional.

## 2023-10-13 ENCOUNTER — Encounter: Payer: Self-pay | Admitting: Family Medicine

## 2023-10-13 ENCOUNTER — Ambulatory Visit: Payer: Medicare Other | Admitting: Family Medicine

## 2023-10-13 VITALS — BP 116/82 | HR 80 | Ht 63.0 in | Wt 124.0 lb

## 2023-10-13 DIAGNOSIS — M533 Sacrococcygeal disorders, not elsewhere classified: Secondary | ICD-10-CM | POA: Diagnosis not present

## 2023-10-13 DIAGNOSIS — M9903 Segmental and somatic dysfunction of lumbar region: Secondary | ICD-10-CM

## 2023-10-13 DIAGNOSIS — M9904 Segmental and somatic dysfunction of sacral region: Secondary | ICD-10-CM | POA: Diagnosis not present

## 2023-10-13 DIAGNOSIS — M9902 Segmental and somatic dysfunction of thoracic region: Secondary | ICD-10-CM | POA: Diagnosis not present

## 2023-10-13 DIAGNOSIS — M9901 Segmental and somatic dysfunction of cervical region: Secondary | ICD-10-CM | POA: Diagnosis not present

## 2023-10-13 DIAGNOSIS — M9908 Segmental and somatic dysfunction of rib cage: Secondary | ICD-10-CM

## 2023-10-13 NOTE — Assessment & Plan Note (Signed)
 Arthritic changes.  Patient has not been taking care of herself secondary to some grief reaction.  We discussed with patient feels that she has good support around including daughter who came into town.  I discussed with patient about icing regimen and home exercises otherwise.  Increase activity slowly.  Follow-up with me again in 5 to 6 weeks.  No medication changes.

## 2023-10-13 NOTE — Patient Instructions (Signed)
 Good to see you! Remember you homework Get to the ocean See you again in 5-6 weeks

## 2023-10-18 ENCOUNTER — Other Ambulatory Visit: Payer: Self-pay | Admitting: Allergy and Immunology

## 2023-10-18 ENCOUNTER — Other Ambulatory Visit (HOSPITAL_BASED_OUTPATIENT_CLINIC_OR_DEPARTMENT_OTHER): Payer: Self-pay

## 2023-10-18 MED ORDER — ESTRADIOL-NORETHINDRONE ACET 0.5-0.1 MG PO TABS
1.0000 | ORAL_TABLET | Freq: Every day | ORAL | 3 refills | Status: AC
  Filled 2023-10-18: qty 84, 84d supply, fill #0
  Filled 2024-04-30: qty 84, 84d supply, fill #1

## 2023-10-18 MED ORDER — BREZTRI AEROSPHERE 160-9-4.8 MCG/ACT IN AERO
2.0000 | INHALATION_SPRAY | Freq: Two times a day (BID) | RESPIRATORY_TRACT | 1 refills | Status: DC
  Filled 2023-10-18 – 2023-11-12 (×4): qty 32.1, 90d supply, fill #0

## 2023-10-18 MED ORDER — ALENDRONATE SODIUM 70 MG PO TABS
70.0000 mg | ORAL_TABLET | ORAL | 1 refills | Status: DC
  Filled 2023-10-18 – 2023-11-11 (×2): qty 30, 210d supply, fill #0
  Filled 2023-11-11: qty 12, 84d supply, fill #0
  Filled 2024-02-04: qty 4, 28d supply, fill #1
  Filled 2024-03-09: qty 4, 28d supply, fill #2

## 2023-10-18 MED ORDER — CETIRIZINE HCL 10 MG PO TABS
10.0000 mg | ORAL_TABLET | Freq: Every day | ORAL | 1 refills | Status: DC | PRN
  Filled 2023-10-18: qty 180, 180d supply, fill #0
  Filled 2024-02-11: qty 36, 36d supply, fill #0
  Filled 2024-02-11: qty 144, 144d supply, fill #0

## 2023-10-19 ENCOUNTER — Other Ambulatory Visit (HOSPITAL_BASED_OUTPATIENT_CLINIC_OR_DEPARTMENT_OTHER): Payer: Self-pay

## 2023-10-19 ENCOUNTER — Other Ambulatory Visit: Payer: Self-pay

## 2023-10-19 MED ORDER — DEXLANSOPRAZOLE 60 MG PO CPDR
60.0000 mg | DELAYED_RELEASE_CAPSULE | Freq: Every morning | ORAL | 5 refills | Status: DC
  Filled 2023-10-19: qty 30, 30d supply, fill #0
  Filled 2023-11-23: qty 30, 30d supply, fill #1
  Filled 2023-12-19: qty 30, 30d supply, fill #2
  Filled 2024-01-18: qty 30, 30d supply, fill #3
  Filled 2024-02-17: qty 30, 30d supply, fill #4

## 2023-11-02 ENCOUNTER — Encounter: Payer: Self-pay | Admitting: Dermatology

## 2023-11-02 ENCOUNTER — Ambulatory Visit: Admitting: Dermatology

## 2023-11-02 VITALS — BP 135/89 | HR 60

## 2023-11-02 DIAGNOSIS — D229 Melanocytic nevi, unspecified: Secondary | ICD-10-CM

## 2023-11-02 DIAGNOSIS — Z808 Family history of malignant neoplasm of other organs or systems: Secondary | ICD-10-CM

## 2023-11-02 DIAGNOSIS — W908XXA Exposure to other nonionizing radiation, initial encounter: Secondary | ICD-10-CM | POA: Diagnosis not present

## 2023-11-02 DIAGNOSIS — L570 Actinic keratosis: Secondary | ICD-10-CM | POA: Diagnosis not present

## 2023-11-02 DIAGNOSIS — D1801 Hemangioma of skin and subcutaneous tissue: Secondary | ICD-10-CM | POA: Diagnosis not present

## 2023-11-02 DIAGNOSIS — L821 Other seborrheic keratosis: Secondary | ICD-10-CM | POA: Diagnosis not present

## 2023-11-02 DIAGNOSIS — L814 Other melanin hyperpigmentation: Secondary | ICD-10-CM | POA: Diagnosis not present

## 2023-11-02 DIAGNOSIS — L578 Other skin changes due to chronic exposure to nonionizing radiation: Secondary | ICD-10-CM

## 2023-11-02 DIAGNOSIS — Z1283 Encounter for screening for malignant neoplasm of skin: Secondary | ICD-10-CM | POA: Diagnosis not present

## 2023-11-02 NOTE — Progress Notes (Signed)
 Total Body Skin Exam (TBSE) Visit   Subjective  Beverly [name] is a 78 y.o. female who presents for the following: Skin Cancer Screening and Full Body Skin Exam  Patient presents today for follow up visit for TBSE. Patient was last evaluated on 04/27/23 . Patient denies medication changes. Patient reports she does have spots, moles and lesions of concern to be evaluated. Patient reports throughout her lifetime she has had moderate sun exposure. Currently, patient reports if she has excessive sun exposure, she does apply sunscreen and/or wears protective coverings. Patient reports she has hx of bx. Patient reports  family history of skin cancers (Father, Siblings and Daughter all BCC). The patient has spots, moles and lesions to be evaluated, some may be new or changing and the patient has concerns that these could be cancer.  The following portions of the chart were reviewed this encounter and updated as appropriate: medications, allergies, medical history  Review of Systems:  No other skin or systemic complaints except as noted in HPI or Assessment and Plan.  Objective  Well appearing patient in no apparent distress; mood and affect are within normal limits.  A full examination was performed including scalp, head, eyes, ears, nose, lips, neck, chest, axillae, abdomen, back, buttocks, bilateral upper extremities, bilateral lower extremities, hands, feet, fingers, toes, fingernails, and toenails. All findings within normal limits unless otherwise noted below.   Relevant physical exam findings are noted in the Assessment and Plan.  Mid Forehead (3) Erythematous thin papules/macules with gritty scale.   Assessment & Plan   LENTIGINES, SEBORRHEIC KERATOSES, HEMANGIOMAS - Benign normal skin lesions - Benign-appearing - Call for any changes  MELANOCYTIC NEVI - Tan-brown and/or pink-flesh-colored symmetric macules and papules - Benign appearing on exam today - Observation - Call clinic  for new or changing moles - Recommend daily use of broad spectrum spf 30+ sunscreen to sun-exposed areas.   ACTINIC DAMAGE - Chronic condition, secondary to cumulative UV/sun exposure - diffuse scaly erythematous macules with underlying dyspigmentation - Recommend daily broad spectrum sunscreen SPF 30+ to sun-exposed areas, reapply every 2 hours as needed.  - Staying in the shade or wearing long sleeves, sun glasses (UVA+UVB protection) and wide brim hats (4-inch brim around the entire circumference of the hat) are also recommended for sun protection.  - Call for new or changing lesions.  ACTINIC KERATOSIS Exam: Erythematous thin papules/macules with gritty scale at the forehead  Actinic keratoses are precancerous spots that appear secondary to cumulative UV radiation exposure/sun exposure over time. They are chronic with expected duration over 1 year. A portion of actinic keratoses will progress to squamous cell carcinoma of the skin. It is not possible to reliably predict which spots will progress to skin cancer and so treatment is recommended to prevent development of skin cancer.  Recommend daily broad spectrum sunscreen SPF 30+ to sun-exposed areas, reapply every 2 hours as needed.  Recommend staying in the shade or wearing long sleeves, sun glasses (UVA+UVB protection) and wide brim hats (4-inch brim around the entire circumference of the hat). Call for new or changing lesions.  Treatment Plan: - Cryo Therapy completed while in office today   SKIN CANCER SCREENING PERFORMED TODAY.  AK (ACTINIC KERATOSIS) (3) Mid Forehead (3) Destruction of lesion - Mid Forehead (3) Complexity: simple   Destruction method: cryotherapy   Informed consent: discussed and consent obtained   Timeout:  patient name, date of birth, surgical site, and procedure verified Lesion destroyed using liquid nitrogen: Yes  Cryotherapy cycles:  3 Post-procedure details: wound care instructions given    Return  in about 8 months (around 07/03/2024) for TBSE.  I, Colon Rueth, am acting as Neurosurgeon for [employer], DO.  Documentation: I have reviewed the above documentation for accuracy and completeness, and I agree with the above.  Louana Roup, DO

## 2023-11-02 NOTE — Patient Instructions (Addendum)

## 2023-11-09 ENCOUNTER — Other Ambulatory Visit (HOSPITAL_BASED_OUTPATIENT_CLINIC_OR_DEPARTMENT_OTHER): Payer: Self-pay

## 2023-11-11 ENCOUNTER — Other Ambulatory Visit (HOSPITAL_BASED_OUTPATIENT_CLINIC_OR_DEPARTMENT_OTHER): Payer: Self-pay

## 2023-11-12 ENCOUNTER — Other Ambulatory Visit (HOSPITAL_BASED_OUTPATIENT_CLINIC_OR_DEPARTMENT_OTHER): Payer: Self-pay

## 2023-11-12 ENCOUNTER — Other Ambulatory Visit: Payer: Self-pay

## 2023-11-17 NOTE — Progress Notes (Signed)
 [facility] [address] [place] [address] Phone: [phone] Subjective:   IBryan Caprio, am serving as a scribe for Dr. Ronnell Coins.  I'm seeing this patient by the request  of:  Combs, Jannett Mems, DO  CC: Back and neck pain follow-up  NUU:[id number]  Beverly [name] is a 78 y.o. female coming in with complaint of back and neck pain. OMT on 10/13/2023. Patient states doing well. No new symptoms.  Medications patient has been prescribed:   Taking:         Reviewed prior external information including notes and imaging from previsou exam, outside providers and external EMR if available.   As well as notes that were available from care everywhere and other healthcare systems.  Past medical history, social, surgical and family history all reviewed in electronic medical record.  No pertanent information unless stated regarding to the chief complaint.   Past Medical History:  Diagnosis Date   Allergic rhinitis    Allergy    Anemia    Asthma    Cataract    bilateral cataracts removed   Chronic cough    DDD (degenerative disc disease), cervical    Esophageal reflux    IBS (irritable bowel syndrome)    Osteopenia 03/14/2013   DEXA @[facility]  02/2013: -1.8    Allergies  Allergen Reactions   Tizanidine      Other Reaction(s): Nightmare     Review of Systems:  No headache, visual changes, nausea, vomiting, diarrhea, constipation, dizziness, abdominal pain, skin rash, fevers, chills, night sweats, weight loss, swollen lymph nodes, body aches, joint swelling, chest pain, shortness of breath, mood changes. POSITIVE muscle aches  Objective  Blood pressure 120/72, pulse 87, height 5\' 3"  (1.6 m), SpO2 98%.   General: No apparent distress alert and oriented x3 mood and affect normal, dressed appropriately.  HEENT: Pupils equal, extraocular movements intact  Respiratory: Patient's speak in full sentences and does not appear short of breath   Cardiovascular: No lower extremity edema, non tender, no erythema  Gait relatively normal MSK:  Back does have some loss lordosis noted.  Some scoliosis noted.  Some hypertonicity noted in multiple different muscles.  Osteopathic findings  C2 flexed rotated and side bent right C6 flexed rotated and side bent left T3 extended rotated and side bent right inhaled rib T7 extended rotated and side bent left L1 flexed rotated and side bent right Sacrum right on right     Assessment and Plan:  SI (sacroiliac) joint dysfunction Still working on posture and ergonomics.  More recently the loss of her horse still seems to be giving her difficulty.  We discussed with the potential for behavioral health referral.  Patient wants to continue to monitor.  Discussed icing regimen and home exercises, discussed which activities to do and which ones to avoid.  Increase activity slowly.  Follow-up again in 6 to 8 weeks.    Nonallopathic problems  Decision today to treat with OMT was based on Physical Exam  After verbal consent patient was treated with HVLA, ME, FPR techniques in cervical, rib, thoracic, lumbar, and sacral  areas  Patient tolerated the procedure well with improvement in symptoms  Patient given exercises, stretches and lifestyle modifications  See medications in patient instructions if given  Patient will follow up in 4-8 weeks     The above documentation has been reviewed and is accurate and complete [employer] Sharell Hilmer, DO  Note: This dictation was prepared with Dragon dictation along with smaller phrase technology. Any transcriptional errors that result from this process are unintentional.

## 2023-11-18 ENCOUNTER — Telehealth: Payer: Self-pay

## 2023-11-18 NOTE — Telephone Encounter (Signed)
 Pt. Asking to be a new pt. Stats her husband comes here.   Copied from CRM [id number]. Topic: General - Other >> Nov 18, 2023 11:11 AM Beverly [name] wrote: Reason for CRM: Patient would like to be seen as a new patient with [facility]//Her husband is currently being seen by [facility]//Please call to advise if she can be seen as well

## 2023-11-22 ENCOUNTER — Encounter: Payer: Self-pay | Admitting: Family Medicine

## 2023-11-22 ENCOUNTER — Ambulatory Visit: Admitting: Family Medicine

## 2023-11-22 VITALS — BP 120/72 | HR 87 | Ht 63.0 in

## 2023-11-22 DIAGNOSIS — M9901 Segmental and somatic dysfunction of cervical region: Secondary | ICD-10-CM

## 2023-11-22 DIAGNOSIS — M9904 Segmental and somatic dysfunction of sacral region: Secondary | ICD-10-CM | POA: Diagnosis not present

## 2023-11-22 DIAGNOSIS — M533 Sacrococcygeal disorders, not elsewhere classified: Secondary | ICD-10-CM | POA: Diagnosis not present

## 2023-11-22 DIAGNOSIS — M9902 Segmental and somatic dysfunction of thoracic region: Secondary | ICD-10-CM | POA: Diagnosis not present

## 2023-11-22 DIAGNOSIS — M9903 Segmental and somatic dysfunction of lumbar region: Secondary | ICD-10-CM | POA: Diagnosis not present

## 2023-11-22 DIAGNOSIS — M9908 Segmental and somatic dysfunction of rib cage: Secondary | ICD-10-CM

## 2023-11-22 NOTE — Patient Instructions (Signed)
 Good to see you! We are always here for you See you again in 6 weeks

## 2023-11-22 NOTE — Assessment & Plan Note (Signed)
 Still working on [employer].  More recently the loss of her horse still seems to be giving her difficulty.  We discussed with the potential for behavioral health referral.  Patient wants to continue to monitor.  Discussed icing regimen and home exercises, discussed which activities to do and which ones to avoid.  Increase activity slowly.  Follow-up again in 6 to 8 weeks.

## 2023-11-24 ENCOUNTER — Other Ambulatory Visit (HOSPITAL_BASED_OUTPATIENT_CLINIC_OR_DEPARTMENT_OTHER): Payer: Self-pay

## 2023-11-30 ENCOUNTER — Telehealth: Payer: Self-pay | Admitting: *Deleted

## 2023-11-30 NOTE — Telephone Encounter (Signed)
 cCopied from CRM #[id number]. Topic: Appointments - Appointment Cancel/Reschedule >> Nov 30, 2023 12:29 PM Hobson Luna F wrote: Patient is calling in because she needs to reschedule her appointment that's on 01/12/24. Patient says it needs to be the week of the 9th or after.  Called patient and moved her to 02/02/24.

## 2023-12-08 ENCOUNTER — Ambulatory Visit: Admitting: Physician Assistant

## 2023-12-09 ENCOUNTER — Other Ambulatory Visit (HOSPITAL_BASED_OUTPATIENT_CLINIC_OR_DEPARTMENT_OTHER): Payer: Self-pay

## 2023-12-09 ENCOUNTER — Encounter: Payer: Self-pay | Admitting: Physician Assistant

## 2023-12-09 ENCOUNTER — Ambulatory Visit: Admitting: Physician Assistant

## 2023-12-09 VITALS — BP 122/62 | HR 68 | Ht 63.0 in | Wt 127.0 lb

## 2023-12-09 DIAGNOSIS — Z9049 Acquired absence of other specified parts of digestive tract: Secondary | ICD-10-CM | POA: Diagnosis not present

## 2023-12-09 DIAGNOSIS — K529 Noninfective gastroenteritis and colitis, unspecified: Secondary | ICD-10-CM | POA: Diagnosis not present

## 2023-12-09 MED ORDER — COLESTIPOL HCL 1 G PO TABS
2.0000 g | ORAL_TABLET | Freq: Two times a day (BID) | ORAL | 5 refills | Status: DC
  Filled 2023-12-09: qty 120, 30d supply, fill #0

## 2023-12-09 NOTE — Patient Instructions (Signed)
 We have sent the following medications to your pharmacy for you to pick up at your convenience: Colestipol   _______________________________________________________  If your blood pressure at your visit was 140/90 or greater, please contact your primary care physician to follow up on this.  _______________________________________________________  If you are age 78 or older, your body mass index should be between 23-30. Your Body mass index is 22.5 kg/m. If this is out of the aforementioned range listed, please consider follow up with your Primary Care Provider.  If you are age 80 or younger, your body mass index should be between 19-25. Your Body mass index is 22.5 kg/m. If this is out of the aformentioned range listed, please consider follow up with your Primary Care Provider.   ________________________________________________________  The [facility] providers would like to encourage you to use MYCHART to communicate with providers for non-urgent requests or questions.  Due to long hold times on the telephone, sending your provider a message by [facility] may be a faster and more efficient way to get a response.  Please allow 48 business hours for a response.  Please remember that this is for non-urgent requests.  _______________________________________________________   Due to recent changes in healthcare laws, you may see the results of your imaging and laboratory studies on MyChart before your provider has had a chance to review them.  We understand that in some cases there may be results that are confusing or concerning to you. Not all laboratory results come back in the same time frame and the provider may be waiting for multiple results in order to interpret others.  Please give us  48 hours in order for your provider to thoroughly review all the results before contacting the office for clarification of your results.    I appreciate the  opportunity to care for you  Thank You    Jennifer Lemmon,PA-C

## 2023-12-09 NOTE — Progress Notes (Addendum)
 Chief Complaint: Diarrhea  HPI:  Beverly [name] is a  78 y/o female, known to Dr. Willy Harvest, with a past medical history as listed below including IBS, osteopenia and multiple others, who was referred to me by Combs, Jannett Mems, * for a complaint of diarrhea.      03/01/2013 colonoscopy normal.  Repeat recommended 10 years.    06/10/2014 office visit with Alfonza Iles, PA-C.  At that time evaluated for chronic cough and GERD.  Her Prilosec was increased to 40 twice daily and scheduled for EGD.    06/17/2014 EGD with normal-appearing esophagitis and GE junction, stomach well-visualized.  Normal-appearing duodenum.    Today, patient presents to clinic and tells me that she has always had chronic diarrheal symptoms.  Apparently saw Dr. Grandville Lax 20 years ago and was diagnosed with IBS-D and her symptoms really had not changed but most recently she tells me that they do inhibit her from doing certain things.  Her husband wanted her to see someone.  She uses Imodium every morning, if she has a bad day sometimes she will take it 3 times a day.  If she has a good day she will take it in the morning and then have a solid/mostly solid stool once in the day and then no further, these good days occur maybe 3 to 4 days a week and the other days she has more than 1 urgent loose stool which requires further Imodium.  Certain foods seem to exacerbate this especially too much salad or fatty foods which she tries to avoid.  Describes prior cholecystectomy.    Denies fever, blood in her stool, chills or weight loss.  Past Medical History:  Diagnosis Date   Allergic rhinitis    Allergy    Anemia    Asthma    Cataract    bilateral cataracts removed   Chronic cough    DDD (degenerative disc disease), cervical    Esophageal reflux    IBS (irritable bowel syndrome)    Osteopenia 03/14/2013   DEXA @[facility]  02/2013: -1.8    Past Surgical History:  Procedure Laterality Date   bilateral cateracts removed     BREAST  LUMPECTOMY Left    FOOT SURGERY     right   LAPAROSCOPIC CHOLECYSTECTOMY  11/2009   right foot bunionectomy     TUBAL LIGATION      Current Outpatient Medications  Medication Sig Dispense Refill   alendronate  (FOSAMAX ) 70 MG tablet Take 1 tablet (70 mg total) by mouth once a week. Take with a full glass of water on an empty stomach. 30 tablet 1   calcium carbonate 100 mg/ml SUSP Take 1 tablet by mouth daily at 12 noon.     cetirizine  (ZYRTEC ) 10 MG tablet Take 1 tablet (10 mg total) by mouth daily as needed for allergies (Can take an extra dose during flare ups.). 180 tablet 1   famotidine  (PEPCID ) 40 MG tablet Take 1 tablet (40 mg total) by mouth at bedtime. 90 tablet 1   fluticasone  (FLONASE ) 50 MCG/ACT nasal spray Place 2 sprays into both nostrils daily. 48 g 1   IRON CR PO      loperamide (IMODIUM A-D) 2 MG tablet Take 1 tablet by mouth daily.     Multiple Vitamin (MULTIVITAMIN) tablet Take 1 tablet by mouth daily.     Spacer/Aero-Holding Chambers DEVI 1 Device by Does not apply route as directed. 1 each 1   albuterol  (VENTOLIN  HFA) 108 (90 Base) MCG/ACT inhaler  Inhale 2 puffs into the lungs every 4 (four) hours as needed for wheezing or shortness of breath. (Patient not taking: Reported on 12/09/2023) 18 g 1   budeson-glycopyrrolate -formoterol  (BREZTRI  AEROSPHERE) 160-9-4.8 MCG/ACT AERO inhaler Inhale 2 puffs into the lungs in the morning and at bedtime. 32.1 g 1   COVID-19 mRNA bivalent vaccine, Pfizer, (PFIZER COVID-19 VAC BIVALENT) injection Inject into the muscle. 0.3 mL 0   COVID-19 mRNA Vac-TriS, Pfizer, (PFIZER-BIONT COVID-19 VAC-TRIS) SUSP injection Inject into the muscle. 0.3 mL 0   COVID-19 mRNA vaccine 2023-2024 (COMIRNATY ) syringe Inject into the muscle. 0.3 mL 0   COVID-19 mRNA vaccine 2023-2024 (COMIRNATY ) syringe Inject 0.3 mLs into the muscle. 0.3 mL 0   COVID-19 mRNA vaccine, Pfizer, (COMIRNATY ) syringe Inject into the muscle. 0.3 mL 0   dexlansoprazole  (DEXILANT ) 60 MG  capsule Take 1 capsule (60 mg total) by mouth in the morning. 30 capsule 5   EPINEPHrine  0.3 mg/0.3 mL IJ SOAJ injection Inject 0.3 mg into the muscle as needed for anaphylaxis. (Patient not taking: Reported on 12/09/2023) 2 each 1   Estradiol -Norethindrone  Acet 0.5-0.1 MG tablet Take 1 tablet by mouth daily. 84 tablet 3   Influenza Vac A&B Surf Ant Adj (FLUAD) 0.5 ML SUSY Inject 0.5 mLs into the muscle once. 2018     influenza vaccine adjuvanted (FLUAD) 0.5 ML injection Inject into the muscle. 0.5 mL 0   Nutritional Supplements (VITAMIN D BOOSTER PO) Take 1 tablet by mouth daily at 12 noon. (Patient not taking: Reported on 12/09/2023)     RSV vaccine recomb adjuvanted (AREXVY ) 120 MCG/0.5ML injection Inject into the muscle. 0.5 mL 0   No current facility-administered medications for this visit.    Allergies as of 12/09/2023 - Review Complete 12/09/2023  Allergen Reaction Noted   Tizanidine   11/02/2023    Family History  Problem Relation Age of Onset   Pancreatic cancer Mother    Heart disease Father    Hypertension Brother    Hyperlipidemia Brother    Colon cancer Maternal Grandfather 23   Esophageal cancer Neg Hx    Rectal cancer Neg Hx    Stomach cancer Neg Hx    Allergic rhinitis Neg Hx    Angioedema Neg Hx    Asthma Neg Hx    Atopy Neg Hx    Eczema Neg Hx    Urticaria Neg Hx    Immunodeficiency Neg Hx     Social History   Socioeconomic History   Marital status: Married    Spouse name: Not on file   Number of children: Not on file   Years of education: Not on file   Highest education level: Not on file  Occupational History   Not on file  Tobacco Use   Smoking status: Never    Passive exposure: Never   Smokeless tobacco: Never  Vaping Use   Vaping status: Never Used  Substance and Sexual Activity   Alcohol use: Yes    Alcohol/week: 7.0 standard drinks of alcohol    Types: 7 Glasses of wine per week   Drug use: No   Sexual activity: Yes  Other Topics Concern    Not on file  Social History Narrative   Not on file   Social Drivers of Health   Financial Resource Strain: Not on file  Food Insecurity: Not on file  Transportation Needs: Not on file  Physical Activity: Not on file  Stress: Not on file  Social Connections: Not on file  Intimate Partner  Violence: Not on file    Review of Systems:    Constitutional: No weight loss, fever or chills Skin: No rash  Cardiovascular: No chest pain Respiratory: No SOB  Gastrointestinal: See HPI and otherwise negative Genitourinary: No dysuria  Neurological: No headache, dizziness or syncope Musculoskeletal: No new muscle or joint pain Hematologic: No bleeding Psychiatric: No history of depression or anxiety   Physical Exam:  Vital signs: BP 122/62   Pulse 68   Ht 5\' 3"  (1.6 m)   Wt 127 lb (57.6 kg)   BMI 22.50 kg/m    Constitutional:   Pleasant elderly Caucasian female appears to be in NAD, Well developed, Well nourished, alert and cooperative Head:  Normocephalic and atraumatic. Eyes:   PEERL, EOMI. No icterus. Conjunctiva pink. Ears:  Normal auditory acuity. Neck:  Supple Throat: Oral cavity and pharynx without inflammation, swelling or lesion.  Respiratory: Respirations even and unlabored. Lungs clear to auscultation bilaterally.   No wheezes, crackles, or rhonchi.  Cardiovascular: Normal S1, S2. No MRG. Regular rate and rhythm. No peripheral edema, cyanosis or pallor.  Gastrointestinal:  Soft, nondistended, nontender. No rebound or guarding. Increased BS all four quadrants, No appreciable masses or hepatomegaly. Rectal:  Not performed.  Msk:  Symmetrical without gross deformities. Without edema, no deformity or joint abnormality.  Neurologic:  Alert and  oriented x4;  grossly normal neurologically.  Skin:   Dry and intact without significant lesions or rashes. Psychiatric: Demonstrates good judgement and reason without abnormal affect or behaviors.  RELEVANT LABS AND IMAGING: CBC     Component Value Date/Time   WBC 5.8 10/29/2020 1450   RBC 4.20 10/29/2020 1450   HGB 13.1 10/29/2020 1450   HCT 40.3 10/29/2020 1450   PLT 280 10/29/2020 1450   MCV 96.0 10/29/2020 1450   MCH 31.2 10/29/2020 1450   MCHC 32.5 10/29/2020 1450   RDW 12.8 10/29/2020 1450   LYMPHSABS 1.1 02/14/2013 1635   MONOABS 0.4 02/14/2013 1635   EOSABS 0.0 02/14/2013 1635   BASOSABS 0.0 02/14/2013 1635    CMP     Component Value Date/Time   NA 142 10/29/2020 1450   K 4.1 10/29/2020 1450   CL 106 10/29/2020 1450   CO2 28 10/29/2020 1450   GLUCOSE 117 (H) 10/29/2020 1450   BUN 22 10/29/2020 1450   CREATININE 0.99 10/29/2020 1450   CALCIUM 9.5 10/29/2020 1450   PROT 6.5 10/29/2020 1450   ALBUMIN 4.4 10/29/2020 1450   AST 19 10/29/2020 1450   ALT 17 10/29/2020 1450   ALKPHOS 57 10/29/2020 1450   BILITOT 0.4 10/29/2020 1450   GFRNONAA 60 (L) 10/29/2020 1450   GFRAA  11/13/2009 1351    >60        The eGFR has been calculated using the MDRD equation. This calculation has not been validated in all clinical situations. eGFR's persistently <60 mL/min signify possible Chronic Kidney Disease.    Assessment: 1.  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  Will trial Colestipol .  1 g tabs, 2 tabs twice daily #120 with 2 refills.  Discussed that we can titrate this up or down pending how she does. 2.  Had a long discussion about IBS-D as well as postcholecystectomy diarrhea. 3.   Patient asked about halitosis, recommend she discuss this first with her dentist 4.  Patient will follow in clinic with me in 2 to 3 months.  She will communicate through MyChart in the meantime to let me know how she is doing and we can titrate as needed.  Reginal Capra, PA-C [facility] 12/09/2023, 9:58 AM  Cc: Augustus Blood, *   Agree w/ evaluation and management.  If she fails to respond to this treatment approach it would be reasonable to pursue colonoscopy and take random biopsies to look for microscopic colitis. She is over 75 and 11 years from last colonoscopy - we had discontinued screening due to age and no hx polyps but diagnostic exam could be appropriate.  Kenney Peacemaker, MD, [facility]

## 2023-12-19 ENCOUNTER — Other Ambulatory Visit (HOSPITAL_BASED_OUTPATIENT_CLINIC_OR_DEPARTMENT_OTHER): Payer: Self-pay

## 2023-12-29 NOTE — Progress Notes (Unsigned)
  [facility] [address] [place] [address] Phone: [phone] Subjective:    I'm seeing this patient by the request  of:  Combs, Jannett Mems, DO  CC:   [id number]  Beverly [name] is a 78 y.o. female coming in with complaint of back and neck pain. OMT 11/22/2023. Patient states   Medications patient has been prescribed: None  Taking:         Reviewed prior external information including notes and imaging from previsou exam, outside providers and external EMR if available.   As well as notes that were available from care everywhere and other healthcare systems.  Past medical history, social, surgical and family history all reviewed in electronic medical record.  No pertanent information unless stated regarding to the chief complaint.   Past Medical History:  Diagnosis Date   Allergic rhinitis    Allergy    Anemia    Asthma    Cataract    bilateral cataracts removed   Chronic cough    DDD (degenerative disc disease), cervical    Esophageal reflux    IBS (irritable bowel syndrome)    Osteopenia 03/14/2013   DEXA @[facility]  02/2013: -1.8    Allergies  Allergen Reactions   Tizanidine      Other Reaction(s): Nightmare     Review of Systems:  No headache, visual changes, nausea, vomiting, diarrhea, constipation, dizziness, abdominal pain, skin rash, fevers, chills, night sweats, weight loss, swollen lymph nodes, body aches, joint swelling, chest pain, shortness of breath, mood changes. POSITIVE muscle aches  Objective  There were no vitals taken for this visit.   General: No apparent distress alert and oriented x3 mood and affect normal, dressed appropriately.  HEENT: Pupils equal, extraocular movements intact  Respiratory: Patient's speak in full sentences and does not appear short of breath  Cardiovascular: No lower extremity edema, non tender, no erythema  Gait MSK:  Back   Osteopathic findings  C2 flexed rotated and  side bent right C6 flexed rotated and side bent left T3 extended rotated and side bent right inhaled rib T9 extended rotated and side bent left L2 flexed rotated and side bent right Sacrum right on right       Assessment and Plan:  No problem-specific Assessment & Plan notes found for this encounter.    Nonallopathic problems  Decision today to treat with OMT was based on Physical Exam  After verbal consent patient was treated with HVLA, ME, FPR techniques in cervical, rib, thoracic, lumbar, and sacral  areas  Patient tolerated the procedure well with improvement in symptoms  Patient given exercises, stretches and lifestyle modifications  See medications in patient instructions if given  Patient will follow up in 4-8 weeks             Note: This dictation was prepared with Dragon dictation along with smaller phrase technology. Any transcriptional errors that result from this process are unintentional.

## 2024-01-04 ENCOUNTER — Ambulatory Visit (INDEPENDENT_AMBULATORY_CARE_PROVIDER_SITE_OTHER): Admitting: Family Medicine

## 2024-01-04 ENCOUNTER — Encounter: Payer: Self-pay | Admitting: Family Medicine

## 2024-01-04 VITALS — BP 120/80 | HR 86 | Ht 63.0 in | Wt 126.0 lb

## 2024-01-04 DIAGNOSIS — M5030 Other cervical disc degeneration, unspecified cervical region: Secondary | ICD-10-CM

## 2024-01-04 DIAGNOSIS — M9908 Segmental and somatic dysfunction of rib cage: Secondary | ICD-10-CM | POA: Diagnosis not present

## 2024-01-04 DIAGNOSIS — M9903 Segmental and somatic dysfunction of lumbar region: Secondary | ICD-10-CM | POA: Diagnosis not present

## 2024-01-04 DIAGNOSIS — M9904 Segmental and somatic dysfunction of sacral region: Secondary | ICD-10-CM

## 2024-01-04 DIAGNOSIS — M9902 Segmental and somatic dysfunction of thoracic region: Secondary | ICD-10-CM | POA: Diagnosis not present

## 2024-01-04 DIAGNOSIS — M9901 Segmental and somatic dysfunction of cervical region: Secondary | ICD-10-CM | POA: Diagnosis not present

## 2024-01-04 DIAGNOSIS — M8580 Other specified disorders of bone density and structure, unspecified site: Secondary | ICD-10-CM

## 2024-01-04 NOTE — Assessment & Plan Note (Signed)
 Degenerative disc disease.  Discussed icing regimen and home exercises.  Continuing to respond extremely well to different treatment options.  After further evaluation did not use osteopathic manipulation for her other difficulties.  Discussed icing regimen and home exercises.  Follow-up again in 6 to 8 weeks

## 2024-01-04 NOTE — Patient Instructions (Signed)
 Great to see you  Try moving the paint to your right side  6-8 week follow up

## 2024-01-04 NOTE — Assessment & Plan Note (Signed)
 Avoided any HVLA because of this today.  Will continue to see specialist.

## 2024-01-12 ENCOUNTER — Ambulatory Visit: Admitting: Nurse Practitioner

## 2024-02-02 ENCOUNTER — Ambulatory Visit (INDEPENDENT_AMBULATORY_CARE_PROVIDER_SITE_OTHER): Admitting: Nurse Practitioner

## 2024-02-02 VITALS — BP 120/82 | HR 64 | Ht 64.0 in | Wt 123.8 lb

## 2024-02-02 DIAGNOSIS — F4320 Adjustment disorder, unspecified: Secondary | ICD-10-CM | POA: Diagnosis not present

## 2024-02-02 DIAGNOSIS — D692 Other nonthrombocytopenic purpura: Secondary | ICD-10-CM

## 2024-02-02 DIAGNOSIS — K588 Other irritable bowel syndrome: Secondary | ICD-10-CM

## 2024-02-02 DIAGNOSIS — J309 Allergic rhinitis, unspecified: Secondary | ICD-10-CM

## 2024-02-02 DIAGNOSIS — J4540 Moderate persistent asthma, uncomplicated: Secondary | ICD-10-CM | POA: Diagnosis not present

## 2024-02-02 DIAGNOSIS — M810 Age-related osteoporosis without current pathological fracture: Secondary | ICD-10-CM

## 2024-02-02 NOTE — Patient Instructions (Addendum)
 If you feel that your grief is not improving, please reach out to me and we can discuss. I do feel the grief counseling is a great idea.  Try adding journaling and 3 good things to your daily regimen as part of your healing.   Managing Loss, Adult People experience loss in many different ways throughout their lives. Events such as moving, changing jobs, and losing friends can create a sense of loss. The loss may be as serious as a major health change, divorce, death of a pet, or death of a loved one. All of these types of loss are likely to create a physical and emotional reaction known as grief. Grief is the result of a major change or an absence of something or someone that you count on. Grief is a normal reaction to loss. A variety of factors can affect your grieving experience, including: The nature of your loss. Your relationship to what or whom you lost. Your understanding of grief and how to manage it. Your support system. Be aware that when grief becomes extreme, it can lead to more severe issues like isolation, depression, anxiety, or suicidal thoughts. Talk with your health care provider if you have any of these issues. How to manage lifestyle changes Keep to your normal routine as much as possible. If you have trouble focusing or doing normal activities, it is acceptable to take some time away from your normal routine. Spend time with friends and loved ones. Eat a healthy diet, get plenty of sleep, and rest when you feel tired. How to recognize changes  The way that you deal with your grief will affect your ability to function as you normally do. When grieving, you may experience these changes: Numbness, shock, sadness, anxiety, anger, denial, and guilt. Thoughts about death. Unexpected crying. A physical sensation of emptiness in your stomach. Problems sleeping and eating. Tiredness (fatigue). Loss of interest in normal activities. Dreaming about or imagining seeing the person  who died. A need to remember what or whom you lost. Difficulty thinking about anything other than your loss for a period of time. Relief. If you have been expecting the loss for a while, you may feel a sense of relief when it happens. Follow these instructions at home: Activity Express your feelings in healthy ways, such as: Talking with others about your loss. It may be helpful to find others who have had a similar loss, such as a support group. Writing down your feelings in a journal. Doing physical activities to release stress and emotional energy. Doing creative activities like painting, sculpting, or playing or listening to music. Practicing resilience. This is the ability to recover and adjust after facing challenges. Reading some resources that encourage resilience may help you to learn ways to practice those behaviors.  General instructions Be patient with yourself and others. Allow the grieving process to happen, and remember that grieving takes time. It is likely that you may never feel completely done with some grief. You may find a way to move on while still cherishing memories and feelings about your loss. Accepting your loss is a process. It can take months or longer to adjust. Keep all follow-up visits. This is important. Where to find support To get support for managing loss: Ask your health care provider for help and recommendations, such as grief counseling or therapy. Think about joining a support group for people who are managing a loss. Where to find more information You can find more information about managing loss  from: American Society of Clinical Oncology: www.cancer.net American Psychological Association: [employer] Contact a health care provider if: Your grief is extreme and keeps getting worse. You have ongoing grief that does not improve. Your body shows symptoms of grief, such as illness. You feel depressed, anxious, or hopeless. Get help right away  if: You have thoughts about hurting yourself or others. Get help right away if you feel like you may hurt yourself or others, or have thoughts about taking your own life. Go to your nearest emergency room or: Call 911. Call the National Suicide Prevention Lifeline at 618-599-1701 or 988. This is open 24 hours a day. Text the Crisis Text Line at 8034165650. Summary Grief is the result of a major change or an absence of someone or something that you count on. Grief is a normal reaction to loss. The depth of grief and the period of recovery depend on the type of loss and your ability to adjust to the change and process your feelings. Processing grief requires patience and a willingness to accept your feelings and talk about your loss with people who are supportive. It is important to find resources that work for you and to realize that people experience grief differently. There is not one grieving process that works for everyone in the same way. Be aware that when grief becomes extreme, it can lead to more severe issues like isolation, depression, anxiety, or suicidal thoughts. Talk with your health care provider if you have any of these issues. This information is not intended to replace advice given to you by your health care provider. Make sure you discuss any questions you have with your health care provider. Document Revised: 02/16/2021 Document Reviewed: 02/16/2021 Elsevier Patient Education  2024 [employer].

## 2024-02-02 NOTE — Progress Notes (Signed)
 Beverly [name] Doing, DNP, AGNP-c [facility] [address] [place], [place] [address] Main Office [phone]   New patient visit   Patient: Beverly [name]   DOB: 07-05-1946   78 y.o. Female  MRN: [id number] Visit Date: 02/02/2024  Patient Care Team: Beverly [name], Beverly FORBES, NP as PCP - General (Nurse Practitioner) Beverly Arch, MD (Inactive) (Obstetrics and Gynecology) Beverly Reggy BIRCH, MD (Pulmonary Disease) Beverly Lupita FORBES, MD (Gastroenterology)  Today's Vitals   02/02/24 1445  BP: 120/82  Pulse: 64  Weight: 123 lb 12.8 oz (56.2 kg)  Height: 5' 4 (1.626 m)   Body mass index is 21.25 kg/m.   Today's healthcare provider: Camie CHARLENA Doing, NP   Chief Complaint  Patient presents with   other    New pt. Est. No other issues,    Subjective    History of Present Illness Beverly [name] is a 78 year old female with asthma and IBS who presents for a new patient visit.  She uses Breztri  NDR once daily for asthma, which she reports helps her breathing. She frequently experiences throat clearing and has allergies to environmental factors such as trees, grasses, mold, and mildew. She uses fluticasone  nasal spray for sinus relief and alternates between Singulair  and Zyrtec  for allergy management, with Zyrtec  currently being effective.  She has a history of IBS characterized by constipation and diarrhea. Recently, she started a low FODMAP diet, which has led to immediate improvements in her symptoms, although she occasionally consumes foods that exacerbate her condition. She describes ongoing issues with her bowel habits, including constipation and diarrhea, related to IBS.  She experienced a severe grief reaction following the euthanasia of her horse in March, with symptoms including shock, inability to think clearly, memory issues, and panic attacks. She has been engaging in meditation and yoga to cope and reports gradual improvement in her mental state.  She has a history  of uterine bleeding due to a UTI, which resolved without recurrence. She also had an abnormal mammogram in 2021, which was followed up with a 3D mammogram and found to be non-concerning.  She is currently taking trazodone 100 mg at bedtime, Vyvanse 40 mg every morning, Ambien 10 mg at bedtime, and Pepcid  for acid reflux. She also uses Zyrtec  and Singulair  for allergies. No chest pain, shortness of breath, or dizziness.  She has been on Fosamax  for two years as part of a trial for bone density management.   History reviewed and reveals the following: Past Medical History:  Diagnosis Date   Abdominal pain 10/29/2020   Acute sinusitis 05/07/2022   Allergic rhinitis    Allergy    Anemia    Anemia 02/11/2020   Asthma    Bee sting 01/15/2021   Cataract    bilateral cataracts removed   Chronic cough    COVID-19 12/01/2021   DDD (degenerative disc disease), cervical    Esophageal reflux    IBS (irritable bowel syndrome)    Menopausal problem 04/27/2023   Osteopenia 03/14/2013   DEXA @[facility]  02/2013: -1.8   Pain of right lower leg 07/02/2022   Urinary tract infectious disease 03/07/2023   Past Surgical History:  Procedure Laterality Date   bilateral cateracts removed     BREAST LUMPECTOMY Left    FOOT SURGERY     right   LAPAROSCOPIC CHOLECYSTECTOMY  11/2009   right foot bunionectomy     TUBAL LIGATION     Family Status  Relation Name Status  Mother  Deceased   Father  Deceased   Brother  (Not Specified)   Brother  (Not Specified)   MGF  (Not Specified)   Neg Hx  (Not Specified)  No partnership data on file   Family History  Problem Relation Age of Onset   Pancreatic cancer Mother    Heart disease Father    Hypertension Brother    Hyperlipidemia Brother    Colon cancer Maternal Grandfather 50   Esophageal cancer Neg Hx    Liver disease Neg Hx    Social History   Socioeconomic History   Marital status: Married    Spouse name: Not on file   Number of children: 2    Years of education: Not on file   Highest education level: Bachelor's degree (e.g., BA, AB, BS)  Occupational History   Occupation: artis  Tobacco Use   Smoking status: Never    Passive exposure: Never   Smokeless tobacco: Never  Vaping Use   Vaping status: Never Used  Substance and Sexual Activity   Alcohol use: Yes    Alcohol/week: 7.0 standard drinks of alcohol    Types: 7 Glasses of wine per week    Comment: occ   Drug use: No   Sexual activity: Yes  Other Topics Concern   Not on file  Social History Narrative   Not on file   Social Drivers of Health   Financial Resource Strain: Low Risk  (02/02/2024)   Overall Financial Resource Strain (CARDIA)    Difficulty of Paying Living Expenses: Not hard at all  Food Insecurity: No Food Insecurity (02/02/2024)   Hunger Vital Sign    Worried About Running Out of Food in the Last Year: Never true    Ran Out of Food in the Last Year: Never true  Transportation Needs: No Transportation Needs (02/02/2024)   PRAPARE - Administrator, Civil Service (Medical): No    Lack of Transportation (Non-Medical): No  Physical Activity: Insufficiently Active (02/02/2024)   Exercise Vital Sign    Days of Exercise per Week: 3 days    Minutes of Exercise per Session: 20 min  Stress: No Stress Concern Present (02/02/2024)   [name] [employer] - Occupational Stress Questionnaire    Feeling of Stress: Only a little  Social Connections: Unknown (02/02/2024)   Social Connection and Isolation Panel    Frequency of Communication with Friends and Family: Twice a week    Frequency of Social Gatherings with Friends and Family: Patient declined    Attends Religious Services: More than 4 times per year    Active Member of [employer] or Organizations: Yes    Attends Engineer, structural: More than 4 times per year    Marital Status: Married   Outpatient Medications Prior to Visit  Medication Sig   alendronate  (FOSAMAX ) 70  MG tablet Take 1 tablet (70 mg total) by mouth once a week. Take with a full glass of water on an empty stomach.   budeson-glycopyrrolate -formoterol  (BREZTRI  AEROSPHERE) 160-9-4.8 MCG/ACT AERO inhaler Inhale 2 puffs into the lungs in the morning and at bedtime.   calcium carbonate 100 mg/ml SUSP Take 1 tablet by mouth daily at 12 noon.   cetirizine  (ZYRTEC ) 10 MG tablet Take 1 tablet (10 mg total) by mouth daily as needed for allergies (Can take an extra dose during flare ups.).   dexlansoprazole  (DEXILANT ) 60 MG capsule Take 1 capsule (60 mg total) by mouth in the morning.  EPINEPHrine  0.3 mg/0.3 mL IJ SOAJ injection Inject 0.3 mg into the muscle as needed for anaphylaxis.   Estradiol -Norethindrone  Acet 0.5-0.1 MG tablet Take 1 tablet by mouth daily. (Patient taking differently: Take 1 tablet by mouth daily. 1 every 2 days)   famotidine  (PEPCID ) 40 MG tablet Take 1 tablet (40 mg total) by mouth at bedtime.   fluticasone  (FLONASE ) 50 MCG/ACT nasal spray Place 2 sprays into both nostrils daily.   IRON CR PO    loperamide (IMODIUM A-D) 2 MG tablet Take 1 tablet by mouth daily.   Multiple Vitamin (MULTIVITAMIN) tablet Take 1 tablet by mouth daily.   Nutritional Supplements (VITAMIN D BOOSTER PO) Take 1 tablet by mouth daily at 12 noon.   Spacer/Aero-Holding Chambers DEVI 1 Device by Does not apply route as directed.   albuterol  (VENTOLIN  HFA) 108 (90 Base) MCG/ACT inhaler Inhale 2 puffs into the lungs every 4 (four) hours as needed for wheezing or shortness of breath. (Patient not taking: Reported on 02/02/2024)   COVID-19 mRNA bivalent vaccine, Pfizer, (PFIZER COVID-19 VAC BIVALENT) injection Inject into the muscle. (Patient not taking: Reported on 02/02/2024)   COVID-19 mRNA Vac-TriS, Pfizer, (PFIZER-BIONT COVID-19 VAC-TRIS) SUSP injection Inject into the muscle. (Patient not taking: Reported on 02/02/2024)   COVID-19 mRNA vaccine 2023-2024 (COMIRNATY ) syringe Inject into the muscle. (Patient not  taking: Reported on 02/02/2024)   COVID-19 mRNA vaccine 2023-2024 (COMIRNATY ) syringe Inject 0.3 mLs into the muscle. (Patient not taking: Reported on 02/02/2024)   COVID-19 mRNA vaccine, Pfizer, (COMIRNATY ) syringe Inject into the muscle. (Patient not taking: Reported on 02/02/2024)   Influenza Vac A&B Surf Ant Adj (FLUAD) 0.5 ML SUSY Inject 0.5 mLs into the muscle once. 2018 (Patient not taking: Reported on 02/02/2024)   influenza vaccine adjuvanted (FLUAD) 0.5 ML injection Inject into the muscle. (Patient not taking: Reported on 02/02/2024)   RSV vaccine recomb adjuvanted (AREXVY ) 120 MCG/0.5ML injection Inject into the muscle. (Patient not taking: Reported on 02/02/2024)   [DISCONTINUED] colestipol  (COLESTID ) 1 g tablet Take 2 tablets (2 g total) by mouth 2 (two) times daily. (Patient not taking: Reported on 02/02/2024)   No facility-administered medications prior to visit.   Allergies  Allergen Reactions   Tizanidine      Other Reaction(s): Nightmare   Immunization History  Administered Date(s) Administered   Fluad Quad(high Dose 65+) 05/31/2022   H1N1 06/21/2008   Influenza Split 05/11/2011, 04/11/2012   Influenza, High Dose Seasonal PF 05/05/2018, 05/19/2023   Influenza, Mdck, Trivalent,PF 6+ MOS(egg free) 05/01/2014   Influenza-Unspecified 04/14/2015   PFIZER Comirnaty (Gray Top)Covid-19 Tri-Sucrose Vaccine 10/29/2020   PFIZER(Purple Top)SARS-COV-2 Vaccination 07/31/2019, 08/19/2019, 04/11/2020, 04/13/2020   Pfizer Covid-19 Vaccine Bivalent Booster 53yrs & up 04/23/2021   Pfizer(Comirnaty )Fall Seasonal Vaccine 12 years and older 06/09/2022, 10/27/2022, 04/21/2023   Pneumococcal Polysaccharide-23 05/23/2012   Respiratory Syncytial Virus Vaccine ,Recomb Aduvanted(Arexvy ) 08/04/2022   Tdap 05/23/2012   Zoster Recombinant(Shingrix) 07/22/2018, 04/11/2019    Health Maintenance Due Health Maintenance Topics with due status: Overdue     Topic Date Due   Medicare Annual Wellness (AWV)  Never done    Review of Systems  All other systems reviewed and are negative.  All review of systems negative except what is listed in the HPI   Objective    BP 120/82   Pulse 64   Ht 5' 4 (1.626 m)   Wt 123 lb 12.8 oz (56.2 kg)   BMI 21.25 kg/m  Physical Exam Constitutional:      Appearance: She is well-developed.  HENT:     Head: Normocephalic and atraumatic.  Cardiovascular:     Rate and Rhythm: Normal rate and regular rhythm.     Heart sounds: Normal heart sounds.  Pulmonary:     Effort: Pulmonary effort is normal.     Breath sounds: Normal breath sounds.  Skin:    General: Skin is warm and dry.  Neurological:     Mental Status: She is alert and oriented to person, place, and time.  Psychiatric:        Behavior: Behavior normal.      No results found for any visits on 02/02/24.  Assessment & Plan      Problem List Items Addressed This Visit     Allergic rhinitis   Allergic rhinitis with multiple environmental triggers including trees, grasses, mold, and mildew. Managed with Zyrtec . Alternated between Singulair  and Claritin due to tolerance development. Zyrtec  is currently effective. Discussed option of using Singulair  concurrently with Zyrtec  if symptoms worsen, as she works differently. - Continue Zyrtec  - Consider restarting Singulair  if symptoms worsen      Irritable bowel syndrome   Long-standing IBS with alternating constipation and diarrhea. Symptoms improved with dietary changes following a low FODMAP diet. Reports feeling more relaxed and less bloated. Discussed importance of maintaining dietary changes and monitoring symptoms. - Continue low FODMAP diet      Osteoporosis   Osteoporosis managed with Fosamax . She is at the end of a two-year trial period for Fosamax . Bone density monitoring is managed by another provider. - Continue follow-up with current provider for bone density monitoring      Asthma   Asthma is well-controlled with Breztri   NDR once daily. Experiences throat clearing, likely due to allergies. Uses fluticasone  nasal spray for sinus relief. No current exacerbations or significant issues. Discussed potential for Breztri  to cause voice changes due to airway relaxation and need to adjust dosage if symptoms arise. 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 Symptoms included inability to think clearly, panic attacks, and severe emotional distress. Symptoms improving with meditation and yoga. Discussed potential benefit of grief counseling and journaling as therapeutic options. - Consider grief counseling - Continue meditation and yoga - Consider journaling as a therapeutic tool      Senile purpura (HCC)   Senile purpura with easy bruising due to loss of subcutaneous fat and fragile capillaries. Discussed management strategies including applying pressure to new bruises and potential use of vitamin E and B12 for skin health. - Consider vitamin E and B12 supplementation - Apply pressure to new bruises to minimize appearance      General Health Maintenance Up to date on vaccines including shingles and pneumonia. Maintains a record of vaccination dates. Discussed importance of staying current with vaccinations and updating records in the system. - Update vaccine records in the system - Provide vaccination dates via MyChart  Follow-up Discussion about the difference between Medicare wellness exams and physical exams. She has a Medicare wellness exam scheduled. Discussed limitations of wellness exams compared to physical exams. - Schedule Medicare wellness exam - Ensure all health records are updated in the system  Return for AWV + CPE.      Quintessa Simmerman, Beverly BRAVO, NP, DNP, AGNP-C [facility]

## 2024-02-04 ENCOUNTER — Other Ambulatory Visit (HOSPITAL_BASED_OUTPATIENT_CLINIC_OR_DEPARTMENT_OTHER): Payer: Self-pay

## 2024-02-06 DIAGNOSIS — F4320 Adjustment disorder, unspecified: Secondary | ICD-10-CM | POA: Insufficient documentation

## 2024-02-06 DIAGNOSIS — D692 Other nonthrombocytopenic purpura: Secondary | ICD-10-CM | POA: Insufficient documentation

## 2024-02-06 NOTE — Assessment & Plan Note (Signed)
 Osteoporosis managed with Fosamax . She is at the end of a two-year trial period for Fosamax . Bone density monitoring is managed by another provider. - Continue follow-up with current provider for bone density monitoring

## 2024-02-06 NOTE — Assessment & Plan Note (Signed)
 Allergic rhinitis with multiple environmental triggers including trees, grasses, mold, and mildew. Managed with Zyrtec . Alternated between Singulair  and Claritin due to tolerance development. Zyrtec  is currently effective. Discussed option of using Singulair  concurrently with Zyrtec  if symptoms worsen, as she works differently. - Continue Zyrtec  - Consider restarting Singulair  if symptoms worsen

## 2024-02-06 NOTE — Assessment & Plan Note (Signed)
 Asthma is well-controlled with Breztri  NDR once daily. Experiences throat clearing, likely due to allergies. Uses fluticasone  nasal spray for sinus relief. No current exacerbations or significant issues. Discussed potential for Breztri  to cause voice changes due to airway relaxation and need to adjust dosage if symptoms arise. - Continue Breztri  NDR once daily - Continue fluticasone  nasal spray as needed - Monitor for changes in voice or breathing difficulties

## 2024-02-06 NOTE — Assessment & Plan Note (Signed)
 Long-standing IBS with alternating constipation and diarrhea. Symptoms improved with dietary changes following a low FODMAP diet. Reports feeling more relaxed and less bloated. Discussed importance of maintaining dietary changes and monitoring symptoms. - Continue low FODMAP diet

## 2024-02-06 NOTE — Assessment & Plan Note (Signed)
 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 Symptoms included inability to think clearly, panic attacks, and severe emotional distress. Symptoms improving with meditation and yoga. Discussed potential benefit of grief counseling and journaling as therapeutic options. - Consider grief counseling - Continue meditation and yoga - Consider journaling as a therapeutic tool

## 2024-02-06 NOTE — Assessment & Plan Note (Signed)
 Senile purpura with easy bruising due to loss of subcutaneous fat and fragile capillaries. Discussed management strategies including applying pressure to new bruises and potential use of vitamin E and B12 for skin health. - Consider vitamin E and B12 supplementation - Apply pressure to new bruises to minimize appearance

## 2024-02-11 ENCOUNTER — Other Ambulatory Visit (HOSPITAL_BASED_OUTPATIENT_CLINIC_OR_DEPARTMENT_OTHER): Payer: Self-pay

## 2024-02-16 ENCOUNTER — Encounter: Payer: Self-pay | Admitting: Physician Assistant

## 2024-02-16 ENCOUNTER — Ambulatory Visit (INDEPENDENT_AMBULATORY_CARE_PROVIDER_SITE_OTHER): Admitting: Physician Assistant

## 2024-02-16 VITALS — BP 132/66 | HR 61 | Ht 64.0 in | Wt 123.4 lb

## 2024-02-16 DIAGNOSIS — Z9049 Acquired absence of other specified parts of digestive tract: Secondary | ICD-10-CM

## 2024-02-16 DIAGNOSIS — K529 Noninfective gastroenteritis and colitis, unspecified: Secondary | ICD-10-CM

## 2024-02-16 DIAGNOSIS — K580 Irritable bowel syndrome with diarrhea: Secondary | ICD-10-CM

## 2024-02-16 NOTE — Patient Instructions (Signed)
 Follow up as needed

## 2024-02-16 NOTE — Progress Notes (Signed)
 Chief Complaint: Follow-up chronic diarrhea  HPI:    Beverly [name] is a 78 year old female with a past medical history as listed below, known to Dr. Avram, who returns to clinic today for follow-up of chronic diarrhea.       03/01/2013 colonoscopy normal.  Repeat recommended 10 years.    06/10/2014 office visit with Greig Corti, PA-C.  At that time evaluated for chronic cough and GERD.  Her Prilosec was increased to 40 twice daily and scheduled for EGD.    06/17/2014 EGD with normal-appearing esophagitis and GE junction, stomach well-visualized.  Normal-appearing duodenum.    12/09/2023 patient seen in clinic for chronic diarrheal symptoms.  Discussed that she had seen Dr. Karyle 20 years ago diagnosed with IBS-D and her symptoms had not changed.  Used Imodium every day sometimes up to 3 times a day if needed.  At that point discussed prior cholecystectomy and patient given a trial of Colestipol  1 g tabs, 2 tabs twice daily.    02/14/2024 patient messaged and described that she had discontinued Colestipol  as she had read some research and did not like it.  On 6/17 she is started low FODMAP diet and found desirable results.    Today, the patient tells me she went out to visit her daughter who lives in [place]  and she suggested she try the low FODMAP diet.  She has been doing this for about 4 weeks now and feels like her bowel movements are much more regular and predictable.  The only time they are not is if she eats too much of something that she should not.  She did try the Colestipol  for a few days, but the packaging said not to lick the pill, so naturally she tried to do this a few days later and saw that it swelled up and this made her nervous to keep taking.  Also felt like it was inconvenient to use twice a day.  The low FODMAP diet is working and she would like to continue this for a while.    Denies fever, chills or weight loss.  Past Medical History:  Diagnosis Date   Abdominal pain  10/29/2020   Acute sinusitis 05/07/2022   Allergic rhinitis    Allergy    Anemia    Anemia 02/11/2020   Asthma    Bee sting 01/15/2021   Cataract    bilateral cataracts removed   Chronic cough    COVID-19 12/01/2021   DDD (degenerative disc disease), cervical    Esophageal reflux    IBS (irritable bowel syndrome)    Menopausal problem 04/27/2023   Osteopenia 03/14/2013   DEXA @LB  02/2013: -1.8   Pain of right lower leg 07/02/2022   Urinary tract infectious disease 03/07/2023    Past Surgical History:  Procedure Laterality Date   bilateral cateracts removed     BREAST LUMPECTOMY Left    FOOT SURGERY     right   LAPAROSCOPIC CHOLECYSTECTOMY  11/2009   right foot bunionectomy     TUBAL LIGATION      Current Outpatient Medications  Medication Sig Dispense Refill   albuterol  (VENTOLIN  HFA) 108 (90 Base) MCG/ACT inhaler Inhale 2 puffs into the lungs every 4 (four) hours as needed for wheezing or shortness of breath. (Patient not taking: Reported on 02/02/2024) 18 g 1   alendronate  (FOSAMAX ) 70 MG tablet Take 1 tablet (70 mg total) by mouth once a week. Take with a full glass of water on an empty stomach. 30  tablet 1   budeson-glycopyrrolate -formoterol  (BREZTRI  AEROSPHERE) 160-9-4.8 MCG/ACT AERO inhaler Inhale 2 puffs into the lungs in the morning and at bedtime. 32.1 g 1   calcium carbonate 100 mg/ml SUSP Take 1 tablet by mouth daily at 12 noon.     cetirizine  (ZYRTEC ) 10 MG tablet Take 1 tablet (10 mg total) by mouth daily as needed for allergies (Can take an extra dose during flare ups.). 180 tablet 1   COVID-19 mRNA bivalent vaccine, Pfizer, (PFIZER COVID-19 VAC BIVALENT) injection Inject into the muscle. (Patient not taking: Reported on 02/02/2024) 0.3 mL 0   COVID-19 mRNA Vac-TriS, Pfizer, (PFIZER-BIONT COVID-19 VAC-TRIS) SUSP injection Inject into the muscle. (Patient not taking: Reported on 02/02/2024) 0.3 mL 0   COVID-19 mRNA vaccine 2023-2024 (COMIRNATY ) syringe Inject into  the muscle. (Patient not taking: Reported on 02/02/2024) 0.3 mL 0   COVID-19 mRNA vaccine 2023-2024 (COMIRNATY ) syringe Inject 0.3 mLs into the muscle. (Patient not taking: Reported on 02/02/2024) 0.3 mL 0   COVID-19 mRNA vaccine, Pfizer, (COMIRNATY ) syringe Inject into the muscle. (Patient not taking: Reported on 02/02/2024) 0.3 mL 0   dexlansoprazole  (DEXILANT ) 60 MG capsule Take 1 capsule (60 mg total) by mouth in the morning. 30 capsule 5   EPINEPHrine  0.3 mg/0.3 mL IJ SOAJ injection Inject 0.3 mg into the muscle as needed for anaphylaxis. 2 each 1   Estradiol -Norethindrone  Acet 0.5-0.1 MG tablet Take 1 tablet by mouth daily. (Patient taking differently: Take 1 tablet by mouth daily. 1 every 2 days) 84 tablet 3   famotidine  (PEPCID ) 40 MG tablet Take 1 tablet (40 mg total) by mouth at bedtime. 90 tablet 1   fluticasone  (FLONASE ) 50 MCG/ACT nasal spray Place 2 sprays into both nostrils daily. 48 g 1   Influenza Vac A&B Surf Ant Adj (FLUAD) 0.5 ML SUSY Inject 0.5 mLs into the muscle once. 2018 (Patient not taking: Reported on 02/02/2024)     influenza vaccine adjuvanted (FLUAD) 0.5 ML injection Inject into the muscle. (Patient not taking: Reported on 02/02/2024) 0.5 mL 0   IRON CR PO      loperamide (IMODIUM A-D) 2 MG tablet Take 1 tablet by mouth daily.     Multiple Vitamin (MULTIVITAMIN) tablet Take 1 tablet by mouth daily.     Nutritional Supplements (VITAMIN D BOOSTER PO) Take 1 tablet by mouth daily at 12 noon.     RSV vaccine recomb adjuvanted (AREXVY ) 120 MCG/0.5ML injection Inject into the muscle. (Patient not taking: Reported on 02/02/2024) 0.5 mL 0   Spacer/Aero-Holding Chambers DEVI 1 Device by Does not apply route as directed. 1 each 1   No current facility-administered medications for this visit.    Allergies as of 02/16/2024 - Review Complete 02/02/2024  Allergen Reaction Noted   Tizanidine   11/02/2023    Family History  Problem Relation Age of Onset   Pancreatic cancer Mother     Heart disease Father    Hypertension Brother    Hyperlipidemia Brother    Colon cancer Maternal Grandfather 31   Esophageal cancer Neg Hx    Liver disease Neg Hx     Social History   Socioeconomic History   Marital status: Married    Spouse name: Not on file   Number of children: 2   Years of education: Not on file   Highest education level: Bachelor's degree (e.g., BA, AB, BS)  Occupational History   Occupation: artis  Tobacco Use   Smoking status: Never    Passive exposure: Never  Smokeless tobacco: Never  Vaping Use   Vaping status: Never Used  Substance and Sexual Activity   Alcohol use: Yes    Alcohol/week: 7.0 standard drinks of alcohol    Types: 7 Glasses of wine per week    Comment: occ   Drug use: No   Sexual activity: Yes  Other Topics Concern   Not on file  Social History Narrative   Not on file   Social Drivers of Health   Financial Resource Strain: Low Risk  (02/02/2024)   Overall Financial Resource Strain (CARDIA)    Difficulty of Paying Living Expenses: Not hard at all  Food Insecurity: No Food Insecurity (02/02/2024)   Hunger Vital Sign    Worried About Running Out of Food in the Last Year: Never true    Ran Out of Food in the Last Year: Never true  Transportation Needs: No Transportation Needs (02/02/2024)   PRAPARE - Administrator, Civil Service (Medical): No    Lack of Transportation (Non-Medical): No  Physical Activity: Insufficiently Active (02/02/2024)   Exercise Vital Sign    Days of Exercise per Week: 3 days    Minutes of Exercise per Session: 20 min  Stress: No Stress Concern Present (02/02/2024)   [employer] of Occupational Health - Occupational Stress Questionnaire    Feeling of Stress: Only a little  Social Connections: Unknown (02/02/2024)   Social Connection and Isolation Panel    Frequency of Communication with Friends and Family: Twice a week    Frequency of Social Gatherings with Friends and Family: Patient  declined    Attends Religious Services: More than 4 times per year    Active Member of [employer] or Organizations: Yes    Attends Engineer, structural: More than 4 times per year    Marital Status: Married  Catering manager Violence: Not on file    Review of Systems:    Constitutional: No weight loss, fever or chills Cardiovascular: No chest pain  Respiratory: No SOB Gastrointestinal: See HPI and otherwise negative   Physical Exam:  Vital signs: BP 132/66 (BP Location: Right Arm, Patient Position: Sitting, Cuff Size: Normal)   Pulse 61   Ht 5' 4 (1.626 m)   Wt 123 lb 6 oz (56 kg)   BMI 21.18 kg/m    Constitutional:   Pleasant elderly Caucasian female appears to be in NAD, Well developed, Well nourished, alert and cooperative Respiratory: Respirations even and unlabored. Lungs clear to auscultation bilaterally.   No wheezes, crackles, or rhonchi.  Cardiovascular: Normal S1, S2. No MRG. Regular rate and rhythm. No peripheral edema, cyanosis or pallor.  Gastrointestinal:  Soft, nondistended, nontender. No rebound or guarding. Normal bowel sounds. No appreciable masses or hepatomegaly. Psychiatric: Oriented to person, place and time. Demonstrates good judgement and reason without abnormal affect or behaviors.  RELEVANT LABS AND IMAGING: CBC    Component Value Date/Time   WBC 5.8 10/29/2020 1450   RBC 4.20 10/29/2020 1450   HGB 13.1 10/29/2020 1450   HCT 40.3 10/29/2020 1450   PLT 280 10/29/2020 1450   MCV 96.0 10/29/2020 1450   MCH 31.2 10/29/2020 1450   MCHC 32.5 10/29/2020 1450   RDW 12.8 10/29/2020 1450   LYMPHSABS 1.1 02/14/2013 1635   MONOABS 0.4 02/14/2013 1635   EOSABS 0.0 02/14/2013 1635   BASOSABS 0.0 02/14/2013 1635    CMP     Component Value Date/Time   NA 142 10/29/2020 1450   K 4.1 10/29/2020 1450  CL 106 10/29/2020 1450   CO2 28 10/29/2020 1450   GLUCOSE 117 (H) 10/29/2020 1450   BUN 22 10/29/2020 1450   CREATININE 0.99 10/29/2020 1450    CALCIUM 9.5 10/29/2020 1450   PROT 6.5 10/29/2020 1450   ALBUMIN 4.4 10/29/2020 1450   AST 19 10/29/2020 1450   ALT 17 10/29/2020 1450   ALKPHOS 57 10/29/2020 1450   BILITOT 0.4 10/29/2020 1450   GFRNONAA 60 (L) 10/29/2020 1450   GFRAA  11/13/2009 1351    >60        The eGFR has been calculated using the MDRD equation. This calculation has not been validated in all clinical situations. eGFR's persistently <60 mL/min signify possible Chronic Kidney Disease.    Assessment: 1.  IBS-D: For her entire life, at least the past 20+ years she has been having issues with ongoing diarrhea , colonoscopy in 2014 was normal, previously diagnosed with IBS-D, could also have an element of postcholecystectomy diarrhea for which we trialed Colestipol , but the pill itself made her nervous, see HPI, currently doing well on the low FODMAP diet  Plan: 1.  Provided the patient with our information regarding the low FODMAP diet.  Discussed that she should stay on this for a total of 6 weeks and then can start adding foods back in to see if it will bother her or not. 2.  Discussed the Colestipol .  The fact that it swells up is just how it dissolves in her stomach, I tried to reassure her.  We could certainly try this again in the future if she wanted.  The idea would be to titrate down on this, she may even just have to take this once a day.  She verbalized understanding. 3.  Patient to follow in clinic with us  as needed.  Beverly Failing, PA-C [facility] 02/16/2024, 10:52 AM  Cc: Judyth Isaiah Bottcher, *

## 2024-02-16 NOTE — Progress Notes (Unsigned)
 [facility] [address] [place] [address] Phone: [phone] Subjective:   ISusannah Gully, am serving as a scribe for Dr. Arthea Claudene.  I'm seeing this patient by the request  of:  Early, Camie BRAVO, NP  CC: Back and neck pain  YEP:Dlagzrupcz  ALLE DIFABIO is a 78 y.o. female coming in with complaint of back and neck pain. OMT on 01/04/2024. Patient states same per usual. No new symptoms.  Medications patient has been prescribed:   Taking: Been seen by outside providers for chronic diarrhea.     X-rays 1 year ago did show moderate thoracic scoliosis degenerative disc disease with increasing scoliosis of the lumbar spine centered at L3-4 as well as moderate arthritic changes of the cervical spine.  Patient is on the Fosamax .   Reviewed prior external information including notes and imaging from previsou exam, outside providers and external EMR if available.   As well as notes that were available from care everywhere and other healthcare systems.  Past medical history, social, surgical and family history all reviewed in electronic medical record.  No pertanent information unless stated regarding to the chief complaint.   Past Medical History:  Diagnosis Date   Abdominal pain 10/29/2020   Acute sinusitis 05/07/2022   Allergic rhinitis    Allergy    Anemia    Anemia 02/11/2020   Asthma    Bee sting 01/15/2021   Cataract    bilateral cataracts removed   Chronic cough    COVID-19 12/01/2021   DDD (degenerative disc disease), cervical    Esophageal reflux    IBS (irritable bowel syndrome)    Menopausal problem 04/27/2023   Osteopenia 03/14/2013   DEXA @LB  02/2013: -1.8   Pain of right lower leg 07/02/2022   Urinary tract infectious disease 03/07/2023    Allergies  Allergen Reactions   Tizanidine      Other Reaction(s): Nightmare     Review of Systems:  No headache, visual changes, nausea, vomiting, diarrhea, constipation,  dizziness, abdominal pain, skin rash, fevers, chills, night sweats, weight loss, swollen lymph nodes, body aches, joint swelling, chest pain, shortness of breath, mood changes. POSITIVE muscle aches  Objective  Blood pressure 118/76, pulse (!) 57, height 5' 4 (1.626 m), weight 121 lb (54.9 kg), SpO2 97%.   General: No apparent distress alert and oriented x3 mood and affect normal, dressed appropriately.  HEENT: Pupils equal, extraocular movements intact  Respiratory: Patient's speak in full sentences and does not appear short of breath  Cardiovascular: No lower extremity edema, non tender, no erythema  Gait MSK:  Back scoliosis noted.  Does have some tightness noted.  No significant loss of muscle bulk.  Osteopathic findings  C2 flexed rotated and side bent right C6 flexed rotated and side bent left T3 extended rotated and side bent right inhaled rib T9 extended rotated and side bent left L2 flexed rotated and side bent right L3 flexed rotated and side bent left L5 flexed rotated and side bent left Sacrum right on right    Assessment and Plan:  Lumbago Degenerative scoliosis noted.  Likely some spinal stenosis.  Has been responding extremely well though to osteopathic manipulation.  Patient seems to be in a better place than she has been in the last couple visits seems to be making great strides follow-up again in 2 to 3 months    Nonallopathic problems  Decision today to treat with OMT was based on Physical Exam  After  verbal consent patient was treated with HVLA, ME, FPR techniques in cervical, rib, thoracic, lumbar, and sacral  areas  Patient tolerated the procedure well with improvement in symptoms  Patient given exercises, stretches and lifestyle modifications  See medications in patient instructions if given  Patient will follow up in 8-12 weeks     The above documentation has been reviewed and is accurate and complete Arthea CHRISTELLA Sharps, DO         Note:  This dictation was prepared with Dragon dictation along with smaller phrase technology. Any transcriptional errors that result from this process are unintentional.

## 2024-02-17 ENCOUNTER — Encounter: Payer: Self-pay | Admitting: Family Medicine

## 2024-02-17 ENCOUNTER — Ambulatory Visit: Admitting: Family Medicine

## 2024-02-17 VITALS — BP 118/76 | HR 57 | Ht 64.0 in | Wt 121.0 lb

## 2024-02-17 DIAGNOSIS — M9902 Segmental and somatic dysfunction of thoracic region: Secondary | ICD-10-CM

## 2024-02-17 DIAGNOSIS — M9901 Segmental and somatic dysfunction of cervical region: Secondary | ICD-10-CM | POA: Diagnosis not present

## 2024-02-17 DIAGNOSIS — M9903 Segmental and somatic dysfunction of lumbar region: Secondary | ICD-10-CM

## 2024-02-17 DIAGNOSIS — M9904 Segmental and somatic dysfunction of sacral region: Secondary | ICD-10-CM | POA: Diagnosis not present

## 2024-02-17 DIAGNOSIS — M545 Low back pain, unspecified: Secondary | ICD-10-CM | POA: Diagnosis not present

## 2024-02-17 DIAGNOSIS — M9908 Segmental and somatic dysfunction of rib cage: Secondary | ICD-10-CM | POA: Diagnosis not present

## 2024-02-17 DIAGNOSIS — G8929 Other chronic pain: Secondary | ICD-10-CM

## 2024-02-17 NOTE — Patient Instructions (Signed)
 [facility] food testing Keep up most of the diet See you again in 10-12 weeks

## 2024-02-17 NOTE — Assessment & Plan Note (Signed)
 Degenerative scoliosis noted.  Likely some spinal stenosis.  Has been responding extremely well though to osteopathic manipulation.  Patient seems to be in a better place than she has been in the last couple visits seems to be making great strides follow-up again in 2 to 3 months

## 2024-03-09 ENCOUNTER — Other Ambulatory Visit (HOSPITAL_BASED_OUTPATIENT_CLINIC_OR_DEPARTMENT_OTHER): Payer: Self-pay

## 2024-03-09 ENCOUNTER — Other Ambulatory Visit: Payer: Self-pay

## 2024-03-20 ENCOUNTER — Ambulatory Visit: Admitting: Allergy and Immunology

## 2024-03-23 ENCOUNTER — Other Ambulatory Visit (HOSPITAL_BASED_OUTPATIENT_CLINIC_OR_DEPARTMENT_OTHER): Payer: Self-pay

## 2024-03-23 MED ORDER — COMIRNATY 30 MCG/0.3ML IM SUSY: 0.3000 mL | PREFILLED_SYRINGE | Freq: Once | INTRAMUSCULAR | 0 refills | Status: AC

## 2024-03-26 ENCOUNTER — Other Ambulatory Visit (HOSPITAL_BASED_OUTPATIENT_CLINIC_OR_DEPARTMENT_OTHER): Payer: Self-pay

## 2024-04-03 ENCOUNTER — Ambulatory Visit (INDEPENDENT_AMBULATORY_CARE_PROVIDER_SITE_OTHER): Admitting: Allergy and Immunology

## 2024-04-03 ENCOUNTER — Other Ambulatory Visit (HOSPITAL_BASED_OUTPATIENT_CLINIC_OR_DEPARTMENT_OTHER): Payer: Self-pay

## 2024-04-03 VITALS — BP 118/82 | HR 66 | Temp 98.1°F | Resp 14 | Ht 63.19 in | Wt 122.4 lb

## 2024-04-03 DIAGNOSIS — J4540 Moderate persistent asthma, uncomplicated: Secondary | ICD-10-CM | POA: Diagnosis not present

## 2024-04-03 DIAGNOSIS — T63481A Toxic effect of venom of other arthropod, accidental (unintentional), initial encounter: Secondary | ICD-10-CM

## 2024-04-03 DIAGNOSIS — K219 Gastro-esophageal reflux disease without esophagitis: Secondary | ICD-10-CM

## 2024-04-03 DIAGNOSIS — M8589 Other specified disorders of bone density and structure, multiple sites: Secondary | ICD-10-CM | POA: Diagnosis not present

## 2024-04-03 DIAGNOSIS — T63481D Toxic effect of venom of other arthropod, accidental (unintentional), subsequent encounter: Secondary | ICD-10-CM

## 2024-04-03 DIAGNOSIS — J3089 Other allergic rhinitis: Secondary | ICD-10-CM

## 2024-04-03 MED ORDER — FAMOTIDINE 40 MG PO TABS
40.0000 mg | ORAL_TABLET | Freq: Every evening | ORAL | 1 refills | Status: AC
  Filled 2024-04-03: qty 90, 90d supply, fill #0

## 2024-04-03 MED ORDER — EPINEPHRINE 0.3 MG/0.3ML IJ SOAJ
0.3000 mg | INTRAMUSCULAR | 1 refills | Status: AC | PRN
  Filled 2024-04-03: qty 2, 30d supply, fill #0
  Filled 2024-06-27: qty 2, 30d supply, fill #1

## 2024-04-03 MED ORDER — ALENDRONATE SODIUM 70 MG PO TABS
70.0000 mg | ORAL_TABLET | ORAL | 1 refills | Status: AC
  Filled 2024-04-03: qty 12, 84d supply, fill #0
  Filled 2024-06-27: qty 12, 84d supply, fill #1

## 2024-04-03 MED ORDER — FLUTICASONE PROPIONATE 50 MCG/ACT NA SUSP
2.0000 | Freq: Every day | NASAL | 1 refills | Status: AC
  Filled 2024-04-03: qty 48, 90d supply, fill #0

## 2024-04-03 MED ORDER — DEXLANSOPRAZOLE 60 MG PO CPDR
60.0000 mg | DELAYED_RELEASE_CAPSULE | Freq: Every morning | ORAL | 5 refills | Status: AC
  Filled 2024-04-03: qty 30, 30d supply, fill #0
  Filled 2024-04-27: qty 30, 30d supply, fill #1
  Filled 2024-06-05: qty 30, 30d supply, fill #2
  Filled 2024-06-27 – 2024-06-28 (×2): qty 30, 30d supply, fill #3

## 2024-04-03 MED ORDER — BREZTRI AEROSPHERE 160-9-4.8 MCG/ACT IN AERO
2.0000 | INHALATION_SPRAY | Freq: Two times a day (BID) | RESPIRATORY_TRACT | 1 refills | Status: AC
  Filled 2024-04-03: qty 32.1, 90d supply, fill #0

## 2024-04-03 MED ORDER — CETIRIZINE HCL 10 MG PO TABS
10.0000 mg | ORAL_TABLET | Freq: Every day | ORAL | 1 refills | Status: AC | PRN
  Filled 2024-04-03 – 2024-06-27 (×2): qty 180, 180d supply, fill #0

## 2024-04-03 NOTE — Progress Notes (Unsigned)
 [place] [facility] [place] [facility] [place] [facility] [place] -    Follow-up Note  Referring Provider: Combs, Isaiah Bottcher, * Primary Provider: Oris Camie BRAVO, NP Date of Office Visit: 04/03/2024  Subjective:   Beverly [name] (DOB: 1946-04-04) is a 78 y.o. female who [facility] in re-evaluation of the following:  HPI: Beverly [name] returns to this clinic in evaluation of asthma, allergic rhinitis, LPR, osteopenia, history of allergic reaction hymenoptera venom exposure.  I last saw her in this clinic for March 2025.  She has not required a systemic steroid or an antibiotic for any type of airway issue.  She rarely uses a short acting bronchodilator and she can exercise without any problem.  She has had very little problems with classic reflux symptoms and her throat is doing quite well.  She has had very little problems with her nose and she can smell and taste with no problem.  She continues to use Breztri  mostly once a day, Flonase  mostly once a day, Dexilant  and famotidine  and also continues on alendronate  for her osteopenia.  She contracted COVID several weeks ago but fortunately this turned out to be just a cold that lasted a week and did not require any therapy.  Allergies as of 04/03/2024       Reactions   Tizanidine     Other Reaction(s): Nightmare        Medication List    alendronate  70 MG tablet Commonly known as: Fosamax  Take 1 tablet (70 mg total) by mouth once a week. Take with a full glass of water on an empty stomach.   Arexvy  120 MCG/0.5ML injection Generic drug: RSV vaccine recomb adjuvanted Inject into the muscle.   Breztri  Aerosphere 160-9-4.8 MCG/ACT Aero inhaler Generic drug: budesonide -glycopyrrolate -formoterol  Inhale 2 puffs into the lungs in the morning and at bedtime.   calcium carbonate 100 mg/ml Susp Take 1 tablet by mouth daily at 12 noon.   cetirizine  10 MG tablet Commonly known as: ZYRTEC  Take 1 tablet  (10 mg total) by mouth daily as needed for allergies (Can take an extra dose during flare ups.).   dexlansoprazole  60 MG capsule Commonly known as: DEXILANT  Take 1 capsule (60 mg total) by mouth in the morning.   EPINEPHrine  0.3 mg/0.3 mL Soaj injection Commonly known as: EPI-PEN Inject 0.3 mg into the muscle as needed for anaphylaxis.   Estradiol -Norethindrone  Acet 0.5-0.1 MG tablet Take 1 tablet by mouth daily. What changed: additional instructions   famotidine  40 MG tablet Commonly known as: PEPCID  Take 1 tablet (40 mg total) by mouth at bedtime.   Fluad 0.5 ML injection Generic drug: influenza vaccine adjuvanted Inject 0.5 mLs into the muscle once. 2018   Fluad Quadrivalent  0.5 ML injection Generic drug: influenza vaccine adjuvanted Inject into the muscle.   fluticasone  50 MCG/ACT nasal spray Commonly known as: FLONASE  Place 2 sprays into both nostrils daily.   Imodium A-D 2 MG tablet Generic drug: loperamide Take 1 tablet by mouth daily.   IRON CR PO   multivitamin tablet Take 1 tablet by mouth daily.   Pfizer COVID-19 Vac Bivalent injection Generic drug: COVID-19 mRNA bivalent vaccine Proofreader) Inject into the muscle.   Pfizer-BioNT COVID-19 Vac-TriS Susp injection Generic drug: COVID-19 mRNA Vac-TriS (Pfizer) Inject into the muscle.   Comirnaty  syringe Generic drug: COVID-19 mRNA vaccine (Pfizer) Inject into the muscle.   Comirnaty  syringe Generic drug: COVID-19 mRNA vaccine (Pfizer) Inject 0.3 mLs into the muscle.   Comirnaty  syringe  Generic drug: COVID-19 mRNA vaccine (Pfizer) Inject into the muscle.   Spacer/Aero-Holding [employer] 1 Device by Does not apply route as directed.   VITAMIN D BOOSTER PO Take 1 tablet by mouth daily at 12 noon.    Past Medical History:  Diagnosis Date   Abdominal pain 10/29/2020   Acute sinusitis 05/07/2022   Allergic rhinitis    Allergy    Anemia    Anemia 02/11/2020   Asthma    Bee sting 01/15/2021    Cataract    bilateral cataracts removed   Chronic cough    COVID-19 12/01/2021   DDD (degenerative disc disease), cervical    Esophageal reflux    IBS (irritable bowel syndrome)    Menopausal problem 04/27/2023   Osteopenia 03/14/2013   DEXA @LB  02/2013: -1.8   Pain of right lower leg 07/02/2022   Urinary tract infectious disease 03/07/2023    Past Surgical History:  Procedure Laterality Date   bilateral cateracts removed     BREAST LUMPECTOMY Left    FOOT SURGERY     right   LAPAROSCOPIC CHOLECYSTECTOMY  11/2009   right foot bunionectomy     TUBAL LIGATION      Review of systems negative except as noted in HPI / PMHx or noted below:  Review of Systems  Constitutional: Negative.   HENT: Negative.    Eyes: Negative.   Respiratory: Negative.    Cardiovascular: Negative.   Gastrointestinal: Negative.   Genitourinary: Negative.   Musculoskeletal: Negative.   Skin: Negative.   Neurological: Negative.   Endo/Heme/Allergies: Negative.   Psychiatric/Behavioral: Negative.       Objective:   Vitals:   04/03/24 1554  BP: 118/82  Pulse: 66  Resp: 14  Temp: 98.1 F (36.7 C)  SpO2: 98%   Height: 5' 3.19 (160.5 cm)  Weight: 122 lb 6.4 oz (55.5 kg)   Physical Exam Constitutional:      Appearance: She is not diaphoretic.  HENT:     Head: Normocephalic.     Right Ear: Tympanic membrane, ear canal and external ear normal.     Left Ear: Tympanic membrane, ear canal and external ear normal.     Nose: Nose normal. No mucosal edema or rhinorrhea.     Mouth/Throat:     Pharynx: Uvula midline. No oropharyngeal exudate.  Eyes:     Conjunctiva/sclera: Conjunctivae normal.  Neck:     Thyroid : No thyromegaly.     Trachea: Trachea normal. No tracheal tenderness or tracheal deviation.  Cardiovascular:     Rate and Rhythm: Normal rate and regular rhythm.     Heart sounds: Normal heart sounds, S1 normal and S2 normal. No murmur heard. Pulmonary:     Effort: No respiratory  distress.     Breath sounds: Normal breath sounds. No stridor. No wheezing or rales.  Lymphadenopathy:     Head:     Right side of head: No tonsillar adenopathy.     Left side of head: No tonsillar adenopathy.     Cervical: No cervical adenopathy.  Skin:    Findings: No erythema or rash.     Nails: There is no clubbing.  Neurological:     Mental Status: She is alert.     Diagnostics: Spirometry was performed and demonstrated an FEV1 of 1.84 at 96 % of predicted.  Assessment and Plan:   1. Asthma, moderate persistent, well-controlled   2. Other allergic rhinitis   3. LPRD (laryngopharyngeal reflux disease)   4. Osteopenia  of multiple sites   5. 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 Continue Breztri  - 2 inhalations 1-2 times per day w/ spacer      2. Continue Flonase  - 1 spray each nostril 1-2 times per day    3. Continue to treat reflux:  A. Dexilant  60 mg 1 time a day in AM   B. Famotidine  40 mg - 1 tablet in evening  C. Minimize caffeine, chocolate, alcohol consumption  4. Continue to treat osteopenia / osteoporosis:   A. Alendronate  70 mg 1 time per week  5. If needed:  A. Zyrtec   B. Albuterol  HFA C. Epi-Pen, Benadryl, MD/ER evaluation  6. Obtain bone density scan [facility]  7. Influenza = Tamiflu.  COVID = Paxlovid.    8. Return to clinic in 6 months or earlier if problem   Overall Beverly [name] has really done very well while utilizing anti-inflammatory agents for airway and attending to her reflux issue as noted above.  She will continue on this plan of action and we will see her back in this clinic in 6 months or earlier if there is a problem.  It is been 2 years since her last bone density scan and she has been using alendronate  during that time and we will check a bone density scan to make sure that she is responding to this agent and if not refer her onto undergo a different form of therapy directed against her bone density issue.   Camellia Denis,  MD Allergy / Immunology [facility]

## 2024-04-03 NOTE — Patient Instructions (Addendum)
  1. Continue Breztri  - 2 inhalations 1-2 times per day w/ spacer      2. Continue Flonase  - 1 spray each nostril 1-2 times per day    3. Continue to treat reflux:  A. Dexilant  60 mg 1 time a day in AM   B. Famotidine  40 mg - 1 tablet in evening  C. Minimize caffeine, chocolate, alcohol consumption  4. Continue to treat osteopenia / osteoporosis:   A. Alendronate  70 mg 1 time per week  5. If needed:  A. Zyrtec   B. Albuterol  HFA C. Epi-Pen, Benadryl, MD/ER evaluation  6. Obtain bone density scan [facility]  7. Influenza = Tamiflu.  COVID = Paxlovid.    8. Return to clinic in 6 months or earlier if problem

## 2024-04-04 ENCOUNTER — Other Ambulatory Visit: Payer: Self-pay

## 2024-04-04 ENCOUNTER — Encounter: Payer: Self-pay | Admitting: Allergy and Immunology

## 2024-04-10 NOTE — Addendum Note (Signed)
 Addended by: FRANCIS ROULEAU A on: 04/10/2024 02:08 PM   Modules accepted: Orders

## 2024-04-17 ENCOUNTER — Other Ambulatory Visit (HOSPITAL_BASED_OUTPATIENT_CLINIC_OR_DEPARTMENT_OTHER)

## 2024-04-25 NOTE — Progress Notes (Unsigned)
 [facility] [address] [place] [address] Phone: [phone] Subjective:   Beverly [name], am serving as a scribe for Dr. Arthea Claudene.  I'm seeing this patient by the request  of:  Early, Camie BRAVO, NP  CC: Back and neck pain follow-up  YEP:Dlagzrupcz  MELE SYLVESTER is a 78 y.o. female coming in with complaint of back and neck pain. OMT 02/17/2024. Patient states that she is stiff throughout the spine due to waiting 10 weeks between appointments. L scapula and R lumbar spine are the worst.   Medications patient has been prescribed: None  Taking:         Reviewed prior external information including notes and imaging from previsou exam, outside providers and external EMR if available.   As well as notes that were available from care everywhere and other healthcare systems.  Past medical history, social, surgical and family history all reviewed in electronic medical record.  No pertanent information unless stated regarding to the chief complaint.   Past Medical History:  Diagnosis Date   Abdominal pain 10/29/2020   Acute sinusitis 05/07/2022   Allergic rhinitis    Allergy    Anemia    Anemia 02/11/2020   Asthma    Bee sting 01/15/2021   Cataract    bilateral cataracts removed   Chronic cough    COVID-19 12/01/2021   DDD (degenerative disc disease), cervical    Esophageal reflux    IBS (irritable bowel syndrome)    Menopausal problem 04/27/2023   Osteopenia 03/14/2013   DEXA @LB  02/2013: -1.8   Pain of right lower leg 07/02/2022   Urinary tract infectious disease 03/07/2023    Allergies  Allergen Reactions   Tizanidine      Other Reaction(s): Nightmare     Review of Systems:  No headache, visual changes, nausea, vomiting, diarrhea, constipation, dizziness, abdominal pain, skin rash, fevers, chills, night sweats, weight loss, swollen lymph nodes, body aches, joint swelling, chest pain, shortness of breath, mood changes.  POSITIVE muscle aches  Objective  Blood pressure 110/82, pulse 71, height 5' 3 (1.6 m), weight 123 lb (55.8 kg), SpO2 97%.   General: No apparent distress alert and oriented x3 mood and affect normal, dressed appropriately.  HEENT: Pupils equal, extraocular movements intact  Respiratory: Patient's speak in full sentences and does not appear short of breath  Cardiovascular: No lower extremity edema, non tender, no erythema  Gait MSK:  Back does have some loss of lordosis.  Some degenerative scoliosis noted.  Some worsening pain with extension of the back noted.  Osteopathic findings  C2 flexed rotated and side bent right C7 flexed rotated and side bent right T3 extended rotated and side bent right inhaled rib T9 extended rotated and side bent left L2 flexed rotated and side bent right L4 flexed rotated and side bent left Sacrum right on right       Assessment and Plan:  Lumbago Attempted manual massage as well as muscle energy techniques.  Patient did do very well with this.  Discussed icing regimen and home exercises, increase activity slowly.  Patient is going to continue to try to find time for herself at this moment and would be a little more active.  Did not want to change any medications at the moment.  Increase activity slowly.  Follow-up again in 6 to 8 weeks    Nonallopathic problems  Decision today to treat with OMT was based on Physical Exam  After verbal  consent patient was treated with  ME, FPR techniques in cervical, rib, thoracic, lumbar, and sacral  areas  Patient tolerated the procedure well with improvement in symptoms  Patient given exercises, stretches and lifestyle modifications  See medications in patient instructions if given  Patient will follow up in 4-8 weeks    The above documentation has been reviewed and is accurate and complete Kristapher Dubuque M Charlisha Market, DO          Note: This dictation was prepared with Dragon dictation along with smaller  phrase technology. Any transcriptional errors that result from this process are unintentional.

## 2024-04-26 ENCOUNTER — Ambulatory Visit: Admitting: Family Medicine

## 2024-04-26 ENCOUNTER — Encounter: Payer: Self-pay | Admitting: Family Medicine

## 2024-04-26 VITALS — BP 110/82 | HR 71 | Ht 63.0 in | Wt 123.0 lb

## 2024-04-26 DIAGNOSIS — M9902 Segmental and somatic dysfunction of thoracic region: Secondary | ICD-10-CM

## 2024-04-26 DIAGNOSIS — G8929 Other chronic pain: Secondary | ICD-10-CM | POA: Diagnosis not present

## 2024-04-26 DIAGNOSIS — M9904 Segmental and somatic dysfunction of sacral region: Secondary | ICD-10-CM | POA: Diagnosis not present

## 2024-04-26 DIAGNOSIS — M9903 Segmental and somatic dysfunction of lumbar region: Secondary | ICD-10-CM | POA: Diagnosis not present

## 2024-04-26 DIAGNOSIS — M9901 Segmental and somatic dysfunction of cervical region: Secondary | ICD-10-CM | POA: Diagnosis not present

## 2024-04-26 DIAGNOSIS — M545 Low back pain, unspecified: Secondary | ICD-10-CM | POA: Diagnosis not present

## 2024-04-26 DIAGNOSIS — M9908 Segmental and somatic dysfunction of rib cage: Secondary | ICD-10-CM

## 2024-04-26 NOTE — Patient Instructions (Addendum)
 Good to see you! Start prioritizing self first Get a massage See you again in 7-8 weeks

## 2024-04-26 NOTE — Assessment & Plan Note (Signed)
 Attempted manual massage as well as muscle energy techniques.  Patient did do very well with this.  Discussed icing regimen and home exercises, increase activity slowly.  Patient is going to continue to try to find time for herself at this moment and would be a little more active.  Did not want to change any medications at the moment.  Increase activity slowly.  Follow-up again in 6 to 8 weeks

## 2024-04-27 ENCOUNTER — Other Ambulatory Visit (HOSPITAL_BASED_OUTPATIENT_CLINIC_OR_DEPARTMENT_OTHER): Payer: Self-pay

## 2024-04-27 ENCOUNTER — Ambulatory Visit: Admitting: Family Medicine

## 2024-04-30 ENCOUNTER — Other Ambulatory Visit (HOSPITAL_BASED_OUTPATIENT_CLINIC_OR_DEPARTMENT_OTHER): Payer: Self-pay

## 2024-06-05 ENCOUNTER — Other Ambulatory Visit (HOSPITAL_BASED_OUTPATIENT_CLINIC_OR_DEPARTMENT_OTHER): Payer: Self-pay

## 2024-06-13 NOTE — Progress Notes (Unsigned)
 [facility] [address] [place] [address] Phone: [phone] Subjective:   Beverly [name], am serving as a scribe for Dr. Arthea Claudene.  I'[name] seeing this patient by the request  of:  Early, Camie BRAVO, NP  CC: Back and neck pain follow-up  YEP:Beverly [name]  Beverly [name] is a 78 y.o. female coming in with complaint of back and neck pain. OMT 04/26/2024.  Patient states that she is the same as last visit. Stiff in lower back especially on the right side.   Medications patient has been prescribed: None  Taking:      Reviewed prior external information including notes and imaging from previsou exam, outside providers and external EMR if available.   As well as notes that were available from care everywhere and other healthcare systems.  Past medical history, social, surgical and family history all reviewed in electronic medical record.  No pertanent information unless stated regarding to the chief complaint.   Past Medical History:  Diagnosis Date   Abdominal pain 10/29/2020   Acute sinusitis 05/07/2022   Allergic rhinitis    Allergy    Anemia    Anemia 02/11/2020   Asthma    Bee sting 01/15/2021   Cataract    bilateral cataracts removed   Chronic cough    COVID-19 12/01/2021   DDD (degenerative disc disease), cervical    Esophageal reflux    IBS (irritable bowel syndrome)    Menopausal problem 04/27/2023   Osteopenia 03/14/2013   DEXA @LB  02/2013: -1.8   Pain of right lower leg 07/02/2022   Urinary tract infectious disease 03/07/2023    Allergies  Allergen Reactions   Tizanidine      Other Reaction(s): Nightmare     Review of Systems:  No headache, visual changes, nausea, vomiting, diarrhea, constipation, dizziness, abdominal pain, skin rash, fevers, chills, night sweats, weight loss, swollen lymph nodes, body aches, joint swelling, chest pain, shortness of breath, mood changes. POSITIVE muscle aches  Objective  Blood  pressure 120/82, height 5' 3 (1.6 [name]), weight 124 lb (56.2 kg).   General: No apparent distress alert and oriented x3 mood and affect normal, dressed appropriately.  HEENT: Pupils equal, extraocular movements intact  Respiratory: Patient's speak in full sentences and does not appear short of breath  Cardiovascular: No lower extremity edema, non tender, no erythema  Gait normal  MSK:  Back does have some loss lordosis noted.  Some tenderness to palpation in the paraspinal musculature.  Low back does have some mild degenerative scoliosis noted as well.  Osteopathic findings  T3 extended rotated and side bent right inhaled rib T11 extended rotated and side bent left L2 flexed rotated and side bent right Sacrum right on right    Assessment and Plan:  Lumbago Has done relatively well.  Uses mostly muscle energy on osteopathic manipulation today after further evaluation.  Discussed icing regimen and home exercises, increase activity slowly.  Follow-up again in 6 to 8 weeks    Nonallopathic problems  Decision today to treat with OMT was based on Physical Exam  After verbal consent patient was treated with  ME, FPR techniques in  rib, thoracic, lumbar, and sacral  areas  Patient tolerated the procedure well with improvement in symptoms  Patient given exercises, stretches and lifestyle modifications  See medications in patient instructions if given  Patient will follow up in 4-8 weeks      The above documentation has been reviewed and is accurate and  complete Beverly [name] Vicy Medico, DO        Note: This dictation was prepared with Dragon dictation along with smaller phrase technology. Any transcriptional errors that result from this process are unintentional.

## 2024-06-14 ENCOUNTER — Encounter: Payer: Self-pay | Admitting: Family Medicine

## 2024-06-14 ENCOUNTER — Ambulatory Visit: Admitting: Family Medicine

## 2024-06-14 VITALS — BP 120/82 | Ht 63.0 in | Wt 124.0 lb

## 2024-06-14 DIAGNOSIS — G8929 Other chronic pain: Secondary | ICD-10-CM

## 2024-06-14 DIAGNOSIS — M9902 Segmental and somatic dysfunction of thoracic region: Secondary | ICD-10-CM

## 2024-06-14 DIAGNOSIS — M9901 Segmental and somatic dysfunction of cervical region: Secondary | ICD-10-CM

## 2024-06-14 DIAGNOSIS — M545 Low back pain, unspecified: Secondary | ICD-10-CM | POA: Diagnosis not present

## 2024-06-14 DIAGNOSIS — M9908 Segmental and somatic dysfunction of rib cage: Secondary | ICD-10-CM

## 2024-06-14 DIAGNOSIS — M9903 Segmental and somatic dysfunction of lumbar region: Secondary | ICD-10-CM

## 2024-06-14 DIAGNOSIS — M9904 Segmental and somatic dysfunction of sacral region: Secondary | ICD-10-CM

## 2024-06-14 NOTE — Patient Instructions (Signed)
 Good to see you! Stay active Get out and about Go shoe shopping See you again in 2 months

## 2024-06-14 NOTE — Assessment & Plan Note (Signed)
 Has done relatively well.  Uses mostly muscle energy on osteopathic manipulation today after further evaluation.  Discussed icing regimen and home exercises, increase activity slowly.  Follow-up again in 6 to 8 weeks

## 2024-06-15 ENCOUNTER — Other Ambulatory Visit (HOSPITAL_BASED_OUTPATIENT_CLINIC_OR_DEPARTMENT_OTHER): Payer: Self-pay

## 2024-06-15 MED ORDER — FLUZONE HIGH-DOSE 0.5 ML IM SUSY
0.5000 mL | PREFILLED_SYRINGE | Freq: Once | INTRAMUSCULAR | 0 refills | Status: AC
  Filled 2024-06-15: qty 0.5, 1d supply, fill #0

## 2024-06-20 LAB — HM MAMMOGRAPHY

## 2024-06-25 ENCOUNTER — Ambulatory Visit: Payer: Self-pay | Admitting: Allergy and Immunology

## 2024-06-25 ENCOUNTER — Ambulatory Visit (HOSPITAL_BASED_OUTPATIENT_CLINIC_OR_DEPARTMENT_OTHER)
Admission: RE | Admit: 2024-06-25 | Discharge: 2024-06-25 | Disposition: A | Source: Ambulatory Visit | Attending: Allergy and Immunology | Admitting: Allergy and Immunology

## 2024-06-25 DIAGNOSIS — M8589 Other specified disorders of bone density and structure, multiple sites: Secondary | ICD-10-CM | POA: Insufficient documentation

## 2024-06-26 ENCOUNTER — Ambulatory Visit: Admitting: Family Medicine

## 2024-06-26 ENCOUNTER — Ambulatory Visit

## 2024-06-26 VITALS — BP 128/90 | HR 73 | Ht 63.0 in

## 2024-06-26 DIAGNOSIS — M545 Low back pain, unspecified: Secondary | ICD-10-CM

## 2024-06-26 DIAGNOSIS — G8929 Other chronic pain: Secondary | ICD-10-CM | POA: Diagnosis not present

## 2024-06-26 DIAGNOSIS — R079 Chest pain, unspecified: Secondary | ICD-10-CM

## 2024-06-26 DIAGNOSIS — R109 Unspecified abdominal pain: Secondary | ICD-10-CM | POA: Diagnosis not present

## 2024-06-26 LAB — COMPREHENSIVE METABOLIC PANEL WITH GFR
ALT: 19 U/L (ref 3–35)
AST: 25 U/L (ref 5–37)
Albumin: 4.6 g/dL (ref 3.5–5.2)
Alkaline Phosphatase: 56 U/L (ref 39–117)
BUN: 20 mg/dL (ref 6–23)
CO2: 28 meq/L (ref 19–32)
Calcium: 9.1 mg/dL (ref 8.4–10.5)
Chloride: 105 meq/L (ref 96–112)
Creatinine, Ser: 1.39 mg/dL — ABNORMAL HIGH (ref 0.40–1.20)
GFR: 36.36 mL/min — ABNORMAL LOW (ref >60.00)
Glucose, Bld: 105 mg/dL — ABNORMAL HIGH (ref 70–99)
Potassium: 4.1 meq/L (ref 3.5–5.1)
Sodium: 141 meq/L (ref 135–145)
Total Bilirubin: 0.3 mg/dL (ref 0.2–1.2)
Total Protein: 6.7 g/dL (ref 6.0–8.3)

## 2024-06-26 LAB — HM PAP SMEAR

## 2024-06-26 LAB — SEDIMENTATION RATE: Sed Rate: 5 mm/h (ref 0–30)

## 2024-06-26 LAB — CBC WITH DIFFERENTIAL/PLATELET
Basophils Absolute: 0 K/uL (ref 0.0–0.1)
Basophils Relative: 0.3 % (ref 0.0–3.0)
Eosinophils Absolute: 0 K/uL (ref 0.0–0.7)
Eosinophils Relative: 0.4 % (ref 0.0–5.0)
HCT: 38.3 % (ref 36.0–46.0)
Hemoglobin: 13.1 g/dL (ref 12.0–15.0)
Lymphocytes Relative: 32.1 % (ref 12.0–46.0)
Lymphs Abs: 1.6 K/uL (ref 0.7–4.0)
MCHC: 34.2 g/dL (ref 30.0–36.0)
MCV: 92.8 fl (ref 78.0–100.0)
Monocytes Absolute: 0.4 K/uL (ref 0.1–1.0)
Monocytes Relative: 7.5 % (ref 3.0–12.0)
Neutro Abs: 3.1 K/uL (ref 1.4–7.7)
Neutrophils Relative %: 59.7 % (ref 43.0–77.0)
Platelets: 261 K/uL (ref 150.0–400.0)
RBC: 4.13 Mil/uL (ref 3.87–5.11)
RDW: 13.7 % (ref 11.5–15.5)
WBC: 5.1 K/uL (ref 4.0–10.5)

## 2024-06-26 MED ORDER — KETOROLAC TROMETHAMINE 60 MG/2ML IM SOLN
60.0000 mg | Freq: Once | INTRAMUSCULAR | Status: AC
  Administered 2024-06-26: 16:00:00 60 mg via INTRAMUSCULAR

## 2024-06-26 MED ORDER — METHYLPREDNISOLONE ACETATE 40 MG/ML IJ SUSP
40.0000 mg | Freq: Once | INTRAMUSCULAR | Status: AC
  Administered 2024-06-26: 16:00:00 40 mg via INTRAMUSCULAR

## 2024-06-26 NOTE — Assessment & Plan Note (Signed)
 Significant worsening symptoms at this time.  Patient's pain is out of proportion to the amount noted today.  Patient is having difficulty even with sitting.  Has some radiation going around the back and even almost into the stomach.  KUB and chest x-ray ordered today.  Laboratory workup also ordered.  CT abdomen and pelvis ordered to further evaluate this pain as well as the musculoskeletal aspect.  Toradol  and Depo-Medrol  injections given.  Does have tramadol .  Patient knows worsening pain to seek medical attention immediately.

## 2024-06-26 NOTE — Patient Instructions (Addendum)
 Labs on the way out Chest x-ray CT abdomin and pelvis with contract. We will be in touch with results. Cocktail today (Toradol  and Methylprednisolone ). No NSAIDs for 24 hrs.  Schedule in 2 months

## 2024-06-26 NOTE — Progress Notes (Signed)
 [facility] [address] [place] [address] Phone: [phone] Subjective:   ISusannah [name], am serving as a scribe for Dr. Arthea Claudene.  I'm seeing this patient by the request  of:  Early, Camie BRAVO, NP  CC: Low back pain follow-up  YEP:Dlagzrupcz  Beverly [name] is a 78 y.o. female coming in with complaint of back and neck pain have seen patient previously and only saw her 2 weeks ago.  Patient states ha not been getting better. Flared started Thursday of last week. Pain in is low back on the R side. Has taken tramadol , Tylenol, robaxin, and ibuprofen.       Recently did have a bone density yesterday that shows stability with the treatment of Fosamax .   Reviewed prior external information including notes and imaging from previsou exam, outside providers and external EMR if available.   As well as notes that were available from care everywhere and other healthcare systems.  Past medical history, social, surgical and family history all reviewed in electronic medical record.  No pertanent information unless stated regarding to the chief complaint.   Past Medical History:  Diagnosis Date   Abdominal pain 10/29/2020   Acute sinusitis 05/07/2022   Allergic rhinitis    Allergy    Anemia    Anemia 02/11/2020   Asthma    Bee sting 01/15/2021   Cataract    bilateral cataracts removed   Chronic cough    COVID-19 12/01/2021   DDD (degenerative disc disease), cervical    Esophageal reflux    IBS (irritable bowel syndrome)    Menopausal problem 04/27/2023   Osteopenia 03/14/2013   DEXA @LB  02/2013: -1.8   Pain of right lower leg 07/02/2022   Urinary tract infectious disease 03/07/2023    Allergies[1]   Review of Systems:  No headache, visual changes, nausea, vomiting, diarrhea, constipation, dizziness, abdominal pain, skin rash, fevers, chills, night sweats, weight loss, swollen lymph nodes, body aches, joint swelling, chest pain,  shortness of breath, mood changes. POSITIVE muscle aches no abdominal pain out of the ordinary.  Objective  Blood pressure (!) 128/90, pulse 73, height 5' 3 (1.6 m), SpO2 97%.   General: No apparent distress alert and oriented x3 mood and affect normal, dressed appropriately.  HEENT: Pupils equal, extraocular movements intact  Respiratory: Patient's speak in full sentences and does not appear short of breath  Cardiovascular: No lower extremity edema, non tender, no erythema  Gait MSK:  Back significant loss of lordosis noted.  Patient does have some degenerative scoliosis noted.  Severe tenderness to palpation noted in the right lower back.  No midline tenderness.  No CVA tenderness.  Abdominal exam shows no masses.  She is at the trace area there      Assessment and Plan:  Lumbago Significant worsening symptoms at this time.  Patient's pain is out of proportion to the amount noted today.  Patient is having difficulty even with sitting.  Has some radiation going around the back and even almost into the stomach.  KUB and chest x-ray ordered today.  Laboratory workup also ordered.  CT abdomen and pelvis ordered to further evaluate this pain as well as the musculoskeletal aspect.  Toradol  and Depo-Medrol  injections given.  Does have tramadol .  Patient knows worsening pain to seek medical attention immediately.         The above documentation has been reviewed and is accurate and complete Beverly Pew M Nigel Ericsson, DO  Note: This dictation was prepared with Dragon dictation along with smaller phrase technology. Any transcriptional errors that result from this process are unintentional.            [1]  Allergies Allergen Reactions   Tizanidine      Other Reaction(s): Nightmare

## 2024-06-27 ENCOUNTER — Other Ambulatory Visit (HOSPITAL_BASED_OUTPATIENT_CLINIC_OR_DEPARTMENT_OTHER): Payer: Self-pay

## 2024-06-27 ENCOUNTER — Ambulatory Visit: Admitting: Dermatology

## 2024-06-27 ENCOUNTER — Ambulatory Visit: Payer: Self-pay | Admitting: Family Medicine

## 2024-06-27 ENCOUNTER — Other Ambulatory Visit: Payer: Self-pay

## 2024-06-27 ENCOUNTER — Telehealth: Payer: Self-pay | Admitting: Family Medicine

## 2024-06-27 ENCOUNTER — Ambulatory Visit: Admitting: Sports Medicine

## 2024-06-27 DIAGNOSIS — N289 Disorder of kidney and ureter, unspecified: Secondary | ICD-10-CM

## 2024-06-27 LAB — URINALYSIS
Bilirubin Urine: NEGATIVE
Hgb urine dipstick: NEGATIVE
Ketones, ur: NEGATIVE
Leukocytes,Ua: NEGATIVE
Nitrite: NEGATIVE
Specific Gravity, Urine: 1.025 (ref 1.000–1.030)
Total Protein, Urine: NEGATIVE
Urine Glucose: NEGATIVE
Urobilinogen, UA: 0.2 (ref 0.0–1.0)
pH: 6 (ref 5.0–8.0)

## 2024-06-27 NOTE — Addendum Note (Signed)
 Addended by: GEROME CROME R on: 06/27/2024 10:51 AM   Modules accepted: Orders

## 2024-06-27 NOTE — Telephone Encounter (Signed)
 Pt left after-hours voicemail, would like CT ordered to [facility] instead of [facility] as states scheduling is easier.

## 2024-06-27 NOTE — Addendum Note (Signed)
 Addended by: GEROME ASHLEY SAUNDERS on: 06/27/2024 07:34 AM   Modules accepted: Orders

## 2024-06-27 NOTE — Telephone Encounter (Signed)
 Location changed to [facility]

## 2024-06-28 ENCOUNTER — Other Ambulatory Visit: Payer: Self-pay

## 2024-06-28 ENCOUNTER — Other Ambulatory Visit (HOSPITAL_BASED_OUTPATIENT_CLINIC_OR_DEPARTMENT_OTHER): Payer: Self-pay

## 2024-06-28 ENCOUNTER — Telehealth: Payer: Self-pay | Admitting: Family Medicine

## 2024-06-28 ENCOUNTER — Ambulatory Visit (HOSPITAL_BASED_OUTPATIENT_CLINIC_OR_DEPARTMENT_OTHER)
Admission: RE | Admit: 2024-06-28 | Discharge: 2024-06-28 | Disposition: A | Source: Ambulatory Visit | Attending: Family Medicine | Admitting: Family Medicine

## 2024-06-28 ENCOUNTER — Ambulatory Visit (HOSPITAL_BASED_OUTPATIENT_CLINIC_OR_DEPARTMENT_OTHER): Admission: RE | Admit: 2024-06-28 | Source: Ambulatory Visit

## 2024-06-28 DIAGNOSIS — N289 Disorder of kidney and ureter, unspecified: Secondary | ICD-10-CM

## 2024-06-28 DIAGNOSIS — R109 Unspecified abdominal pain: Secondary | ICD-10-CM | POA: Diagnosis present

## 2024-06-28 DIAGNOSIS — R9389 Abnormal findings on diagnostic imaging of other specified body structures: Secondary | ICD-10-CM

## 2024-06-28 MED ORDER — IOHEXOL 300 MG/ML  SOLN
100.0000 mL | Freq: Once | INTRAMUSCULAR | Status: AC | PRN
  Administered 2024-06-28: 09:00:00 100 mL via INTRAVENOUS

## 2024-06-28 NOTE — Telephone Encounter (Signed)
 Pt is at [facility] having CT done today. [facility] staff advising that results could take a week or more.  Pt husband concerned as Beverly [name]'s pain has increased and request this be treated as stat.  Not sure if we can update the order or just reach out to the reading room to expedite.

## 2024-06-29 ENCOUNTER — Other Ambulatory Visit (HOSPITAL_COMMUNITY): Payer: Self-pay | Admitting: Obstetrics & Gynecology

## 2024-06-29 ENCOUNTER — Ambulatory Visit: Payer: Self-pay

## 2024-06-29 ENCOUNTER — Telehealth: Payer: Self-pay

## 2024-06-29 ENCOUNTER — Ambulatory Visit

## 2024-06-29 ENCOUNTER — Other Ambulatory Visit: Payer: Self-pay | Admitting: Family Medicine

## 2024-06-29 DIAGNOSIS — Q519 Congenital malformation of uterus and cervix, unspecified: Secondary | ICD-10-CM

## 2024-06-29 DIAGNOSIS — R9389 Abnormal findings on diagnostic imaging of other specified body structures: Secondary | ICD-10-CM

## 2024-06-29 DIAGNOSIS — M545 Low back pain, unspecified: Secondary | ICD-10-CM

## 2024-06-29 MED ORDER — METHYLPREDNISOLONE ACETATE 80 MG/ML IJ SUSP
80.0000 mg | Freq: Once | INTRAMUSCULAR | Status: AC
  Administered 2024-06-29: 80 mg via INTRAMUSCULAR

## 2024-06-29 MED ORDER — KETOROLAC TROMETHAMINE 30 MG/ML IJ SOLN
30.0000 mg | Freq: Once | INTRAMUSCULAR | Status: AC
  Administered 2024-06-29: 30 mg via INTRAMUSCULAR

## 2024-06-29 NOTE — Telephone Encounter (Unsigned)
 Copied from CRM #[id number]. Topic: Clinical - Red Word Triage >> Jun 29, 2024 10:40 AM Beverly [name] wrote: Red Word that prompted transfer to Nurse Triage: Pt's husband Beverly [name] on the line, pt has extreme back pain. X-ray and scan with Arthea Sharps was performed and all came back negative. Was advised to go to ER. Pt felt that her needs were not met and is req to fu with PCP or what he recommends. Transferring to NT due to symptoms. >> Jun 29, 2024 11:03 AM Beverly [name] wrote: The patient's husband is calling back because he no longer wanted to wait on hold for triage. He prefers a call back. I let him know that a nurse should be reaching out within the next 2 hours due to high patient volume.

## 2024-06-29 NOTE — Telephone Encounter (Signed)
 Copied from CRM #[id number]. Topic: Clinical - Red Word Triage >> Jun 29, 2024 10:40 AM Hadassah PARAS wrote: Red Word that prompted transfer to Nurse Triage: Pt's husband Toribio on the line, pt has extreme back pain. X-ray and scan with Arthea Sharps was performed and all came back negative. Was advised to go to ER. Pt felt that her needs were not met and is req to fu with PCP or what he recommends. Transferring to NT due to symptoms. >> Jun 29, 2024 11:03 AM China J wrote: The patient's husband is calling back because he no longer wanted to wait on hold for triage. He prefers a call back. I let him know that a nurse should be reaching out within the next 2 hours due to high patient volume.

## 2024-06-29 NOTE — Telephone Encounter (Signed)
 In Pt Calls encounter:   Beverly [name], RMA to Me  (Selected Message)     06/29/24 12:37 PM Per Camie Doing NP pt. Needs to go to [facility], Urgent Care, or ED if in that much pain. Pt understood and said she would be going [facility].   Pt went to [facility] doctor today per chart.

## 2024-06-29 NOTE — Patient Instructions (Signed)
 NO NSAIDs for 24 hours.

## 2024-06-29 NOTE — Addendum Note (Signed)
 Addended by: DESIDERIO COMMONS R on: 06/29/2024 08:40 AM   Modules accepted: Orders

## 2024-06-29 NOTE — Progress Notes (Signed)
 Orders were previously done

## 2024-06-29 NOTE — Progress Notes (Signed)
 Patient received Toradol  30 mg in left buttocks and Methylprednisolone  80 mg in right buttocks. Patient tolerated well.

## 2024-06-30 ENCOUNTER — Emergency Department (HOSPITAL_BASED_OUTPATIENT_CLINIC_OR_DEPARTMENT_OTHER)

## 2024-06-30 ENCOUNTER — Other Ambulatory Visit (HOSPITAL_BASED_OUTPATIENT_CLINIC_OR_DEPARTMENT_OTHER): Payer: Self-pay

## 2024-06-30 ENCOUNTER — Other Ambulatory Visit: Payer: Self-pay

## 2024-06-30 ENCOUNTER — Encounter (HOSPITAL_BASED_OUTPATIENT_CLINIC_OR_DEPARTMENT_OTHER): Payer: Self-pay | Admitting: Emergency Medicine

## 2024-06-30 ENCOUNTER — Emergency Department (HOSPITAL_BASED_OUTPATIENT_CLINIC_OR_DEPARTMENT_OTHER)
Admission: EM | Admit: 2024-06-30 | Discharge: 2024-06-30 | Disposition: A | Discharge status: Home / Self Care | Attending: Emergency Medicine | Admitting: Emergency Medicine

## 2024-06-30 DIAGNOSIS — R930 Abnormal findings on diagnostic imaging of skull and head, not elsewhere classified: Secondary | ICD-10-CM | POA: Insufficient documentation

## 2024-06-30 DIAGNOSIS — M5416 Radiculopathy, lumbar region: Secondary | ICD-10-CM | POA: Diagnosis not present

## 2024-06-30 DIAGNOSIS — M545 Low back pain, unspecified: Secondary | ICD-10-CM | POA: Diagnosis present

## 2024-06-30 DIAGNOSIS — Z79899 Other long term (current) drug therapy: Secondary | ICD-10-CM | POA: Diagnosis not present

## 2024-06-30 MED ORDER — OXYCODONE HCL 5 MG PO TABS
5.0000 mg | ORAL_TABLET | Freq: Four times a day (QID) | ORAL | 0 refills | Status: AC | PRN
  Filled 2024-06-30: qty 10, 3d supply, fill #0

## 2024-06-30 MED ORDER — DIAZEPAM 5 MG/ML IJ SOLN
2.5000 mg | Freq: Once | INTRAMUSCULAR | Status: AC
  Administered 2024-06-30: 2.5 mg via INTRAVENOUS
  Filled 2024-06-30: qty 2

## 2024-06-30 MED ORDER — METHYLPREDNISOLONE 4 MG PO TBPK
ORAL_TABLET | ORAL | 0 refills | Status: AC
  Filled 2024-06-30: qty 21, 6d supply, fill #0

## 2024-06-30 MED ORDER — CYCLOBENZAPRINE HCL 10 MG PO TABS
10.0000 mg | ORAL_TABLET | Freq: Two times a day (BID) | ORAL | 0 refills | Status: AC | PRN
  Filled 2024-06-30: qty 20, 10d supply, fill #0

## 2024-06-30 MED ORDER — HYDROMORPHONE HCL 1 MG/ML IJ SOLN
1.0000 mg | Freq: Once | INTRAMUSCULAR | Status: AC
  Administered 2024-06-30: 1 mg via INTRAVENOUS
  Filled 2024-06-30: qty 1

## 2024-06-30 NOTE — Discharge Instructions (Addendum)
 Recommend 1000 mg of Tylenol every 6 hours as needed for pain.   Continue lidocaine patches.  T ake Medrol  Dosepak as prescribed, TAKE your first dose tonight.  I  prescribed you a new muscle relaxant called Flexeril  take this instead of Robaxin.    I have also prescribed you narcotic pain medicine called oxycodone  for breakthrough pain.  This medication is sedating so please be careful with its use.  Do not mix alcohol drugs or dangerous activities including driving.  Follow-up with your primary care doctor in spine doctor for further pain management.

## 2024-06-30 NOTE — ED Triage Notes (Signed)
 Pt c/o persistent lower RT side back pain x 2 weeks. 500mg  Robaxin and 200 mg ibuprofen at 11. Denies urinary symptoms. Pt recent tx for same

## 2024-06-30 NOTE — ED Provider Notes (Signed)
 " [place] [facility]MENT AT [facility] Provider Note   CSN: [id number] Arrival date & time: 06/30/24  1117     Patient presents with: Back Pain   Beverly [name] is a 78 y.o. female.   Patient here with ongoing back pain.  Mostly right lower back pain.  She just had lab work CT scan that was unremarkable.  She has been on Robaxin ibuprofen without much relief.  She was given a steroid shot several times in the last week or so.  She denies any urinary symptoms.  She denies any weakness numbness tingling.  Denies any loss of bowel or bladder.  Pain has been fairly constant.  She has not had any narcotic pain medicine.  She does have history of scoliosis.  No back surgery history.  The history is provided by the patient.       Prior to Admission medications  Medication Sig Start Date End Date Taking? Authorizing Provider  cyclobenzaprine  (FLEXERIL ) 10 MG tablet Take 1 tablet (10 mg total) by mouth 2 (two) times daily as needed for muscle spasms. 06/30/24  Yes Bonnell Placzek, DO  methylPREDNISolone  (MEDROL  DOSEPAK) 4 MG TBPK tablet Follow package insert 06/30/24  Yes Kaci Freel, DO  oxyCODONE  (ROXICODONE ) 5 MG immediate release tablet Take 1 tablet (5 mg total) by mouth every 6 (six) hours as needed for up to 10 doses. 06/30/24  Yes Miquan Tandon, DO  alendronate  (FOSAMAX ) 70 MG tablet Take 1 tablet (70 mg total) by mouth once a week. Take with a full glass of water on an empty stomach. 04/03/24   Kozlow, Camellia PARAS, MD  budesonide -glycopyrrolate -formoterol  (BREZTRI  AEROSPHERE) 160-9-4.8 MCG/ACT AERO inhaler Inhale 2 puffs into the lungs in the morning and at bedtime. 04/03/24   Kozlow, Eric J, MD  calcium carbonate 100 mg/ml SUSP Take 1 tablet by mouth daily at 12 noon.    [provider]  cetirizine  (ZYRTEC ) 10 MG tablet Take 1 tablet (10 mg total) by mouth daily as needed for allergies (Can take an extra dose during flare ups.). 04/03/24   Kozlow, Eric J, MD   COVID-19 mRNA bivalent vaccine, Pfizer, (PFIZER COVID-19 VAC BIVALENT) injection Inject into the muscle. 04/23/21   Luiz Channel, MD  COVID-19 mRNA Vac-TriS, Pfizer, (PFIZER-BIONT COVID-19 VAC-TRIS) SUSP injection Inject into the muscle. 10/29/20   Luiz Channel, MD  COVID-19 mRNA vaccine [id number] (COMIRNATY ) syringe Inject into the muscle. 06/09/22   Luiz Channel, MD  COVID-19 mRNA vaccine [id number] (COMIRNATY ) syringe Inject 0.3 mLs into the muscle. 10/25/22   Luiz Channel, MD  COVID-19 mRNA vaccine, Pfizer, (COMIRNATY ) syringe Inject into the muscle. 04/21/23   Luiz Channel, MD  dexlansoprazole  (DEXILANT ) 60 MG capsule Take 1 capsule (60 mg total) by mouth in the morning. 04/03/24   Kozlow, Camellia PARAS, MD  EPINEPHrine  0.3 mg/0.3 mL IJ SOAJ injection Inject 0.3 mg into the muscle as needed for anaphylaxis. 04/03/24   Kozlow, Camellia PARAS, MD  Estradiol -Norethindrone  Acet 0.5-0.1 MG tablet Take 1 tablet by mouth daily. Patient taking differently: Take 1 tablet by mouth daily. 1 every 2 days 10/18/23     famotidine  (PEPCID ) 40 MG tablet Take 1 tablet (40 mg total) by mouth at bedtime. 04/03/24   Kozlow, Camellia PARAS, MD  fluticasone  (FLONASE ) 50 MCG/ACT nasal spray Place 2 sprays into both nostrils daily. 04/03/24   Kozlow, Camellia PARAS, MD  Influenza Vac A&B Surf Ant Adj (FLUAD) 0.5 ML SUSY Inject 0.5 mLs into the muscle once. 2018  [provider]  influenza vaccine adjuvanted (FLUAD) 0.5 ML injection Inject into the muscle. 05/31/22   Luiz Channel, MD  IRON CR PO     [provider]  loperamide (IMODIUM A-D) 2 MG tablet Take 1 tablet by mouth daily.    [provider]  Multiple Vitamin (MULTIVITAMIN) tablet Take 1 tablet by mouth daily.    [provider]  Nutritional Supplements (VITAMIN D BOOSTER PO) Take 1 tablet by mouth daily at 12 noon.    [provider]  RSV vaccine recomb adjuvanted (AREXVY ) 120 MCG/0.5ML injection Inject into the muscle. 08/04/22    Luiz Channel, MD  Spacer/Aero-Holding Raguel DEVI 1 Device by Does not apply route as directed. 09/07/22   Kozlow, Camellia PARAS, MD    Allergies: Tizanidine     Review of Systems  Updated Vital Signs BP (!) 174/94   Pulse 60   Temp 98.8 F (37.1 C) (Oral)   Resp 16   Wt 55.8 kg   SpO2 98%   BMI 21.79 kg/m   Physical Exam Vitals and nursing note reviewed.  Constitutional:      General: She is not in acute distress.    Appearance: She is well-developed. She is not ill-appearing.  HENT:     Head: Normocephalic and atraumatic.     Nose: Nose normal.     Mouth/Throat:     Mouth: Mucous membranes are moist.  Eyes:     Extraocular Movements: Extraocular movements intact.     Conjunctiva/sclera: Conjunctivae normal.     Pupils: Pupils are equal, round, and reactive to light.  Cardiovascular:     Rate and Rhythm: Normal rate and regular rhythm.     Pulses: Normal pulses.     Heart sounds: Normal heart sounds. No murmur heard. Pulmonary:     Effort: Pulmonary effort is normal. No respiratory distress.     Breath sounds: Normal breath sounds.  Abdominal:     Palpations: Abdomen is soft.     Tenderness: There is no abdominal tenderness.  Musculoskeletal:        General: Tenderness present. No swelling.     Cervical back: Normal range of motion and neck supple.     Comments: Tenderness to the paraspinal lumbar muscles on the right  Skin:    General: Skin is warm and dry.     Capillary Refill: Capillary refill takes less than 2 seconds.  Neurological:     General: No focal deficit present.     Mental Status: She is alert and oriented to person, place, and time.     Cranial Nerves: No cranial nerve deficit.     Sensory: No sensory deficit.     Motor: No weakness.     Coordination: Coordination normal.     Comments: 5+ out of 5 strength throughout, normal sensation, no drift, normal finger-nose-finger, normal speech  Psychiatric:        Mood and Affect: Mood normal.      (all labs ordered are listed, but only abnormal results are displayed) Labs Reviewed - No data to display  EKG: None  Radiology: MR LUMBAR SPINE WO CONTRAST Result Date: 06/30/2024 EXAM: MRI LUMBAR SPINE 06/30/2024 01:39:21 PM TECHNIQUE: Multiplanar multisequence MRI of the lumbar spine was performed without the administration of intravenous contrast. COMPARISON: Lumbar spine radiographs 01/11/2023. CLINICAL HISTORY: Low back pain, symptoms persist with > 6 wks treatment. Right low back pain for 2 weeks. FINDINGS: BONES AND ALIGNMENT: Dextroconvex curvature of the lumbar spine is  centered at L3-L4. Slight degenerative retrolisthesis is present at L2-L3 and anterolisthesis present at L3-L4. Normal vertebral body heights. Heterogenous fatty infiltration of the interspaces is within normal limits for age. SPINAL CORD: The conus medullaris terminates at L1. SOFT TISSUES: No paraspinal mass. L1-L2: No significant disc herniation. Asymmetric right-sided facet hypertrophy present at L1-L2. No spinal canal stenosis or neural foraminal narrowing. L2-L3: No significant disc herniation. Mild facet hypertrophy is present bilaterally at L2-L3. No spinal canal stenosis or neural foraminal narrowing. L3-L4: A left lateral disc protrusion is present at L3-L4. Moderate left subarticular and mild left foraminal stenosis is present. L4-L5: A leftward disc protrusion is present at L4-L5. Mild left foraminal narrowing is present. L5-S1: A rightward disc protrusion is present at L5-S1. No spinal canal stenosis or neural foraminal narrowing. That may contact the exiting L5 nerve roots beyond the foramen. IMPRESSION: 1. Left lateral disc protrusion at L3-4 with moderate left subarticular and mild left foraminal stenosis. 2. Leftward disc protrusion at L4-5 with mild left foraminal narrowing. 3. Rightward disc protrusion at L5-S1 without focal stenosis, possibly contacting the exiting right L5 nerve roots beyond the  foramen. Electronically signed by: Lonni Necessary MD 06/30/2024 01:58 PM EST RP Workstation: [id number]     Procedures   Medications Ordered in the ED  diazepam  (VALIUM ) injection 2.5 mg (2.5 mg Intravenous Given 06/30/24 1247)  HYDROmorphone  (DILAUDID ) injection 1 mg (1 mg Intravenous Given 06/30/24 1349)                                    Medical Decision Making Amount and/or Complexity of Data Reviewed Radiology: ordered.  Risk Prescription drug management.   Beverly [name] is here with ongoing right lower back pain.  She had extensive workup earlier this week with CT scan basic labs urinalysis that were normal.  She is on muscle relaxant and got steroid shot yesterday without much improvement.  She denies any loss of bowel or bladder.  Denies any trauma.  She does have a fair amount of scoliosis that you can see on CT scan the other day.  Overall still in some good discomfort.  I do have MRI today and we will get an MRI to further evaluate but I have very low suspicion for cauda equina or any other acute emergent back problem.  Have no concern for infectious process.  Will give her Valium  and narcotic pain medicine for relief while she is here.  Lab work recently also unremarkable.  Vital signs are reassuring.  Her exam is reassuring.  I do suspect some sort of radicular process/pinched nerve process.  She is neurovascular neuromuscular intact on exam.  No cauda equina symptoms.  MRI does show some left lateral disc protrusion at L3 and L4 and L4-L5 and some rightward disc protrusion at L5-S1 possibly contacting the exiting right L5 nerve root.  I do suspect that this is where the pain is coming from.  Luckily she is not having pain down her legs mostly focally in the back.  Will put her on steroid Medrol  Dosepak, oxycodone  for breakthrough pain, Flexeril  muscle relaxant continue Tylenol continue lidocaine patches.  Over follow-up with spine team.  Discharge.  This chart was  dictated using voice recognition software.  Despite best efforts to proofread,  errors can occur which can change the documentation meaning.      Final diagnoses:  Acute low back pain, unspecified back pain  laterality, unspecified whether sciatica present  Lumbar radiculopathy    ED Discharge Orders          Ordered    methylPREDNISolone  (MEDROL  DOSEPAK) 4 MG TBPK tablet        06/30/24 1316    cyclobenzaprine  (FLEXERIL ) 10 MG tablet  2 times daily PRN        06/30/24 1316    oxyCODONE  (ROXICODONE ) 5 MG immediate release tablet  Every 6 hours PRN        06/30/24 1316               Maysin Carstens, DO 06/30/24 1414  "

## 2024-07-16 ENCOUNTER — Ambulatory Visit (HOSPITAL_BASED_OUTPATIENT_CLINIC_OR_DEPARTMENT_OTHER)
Admission: RE | Admit: 2024-07-16 | Discharge: 2024-07-16 | Disposition: A | Discharge status: Home / Self Care | Source: Ambulatory Visit | Attending: Obstetrics & Gynecology | Admitting: Obstetrics & Gynecology

## 2024-07-16 DIAGNOSIS — R9389 Abnormal findings on diagnostic imaging of other specified body structures: Secondary | ICD-10-CM | POA: Diagnosis present

## 2024-07-30 ENCOUNTER — Encounter: Payer: Self-pay | Admitting: Nurse Practitioner

## 2024-07-30 ENCOUNTER — Telehealth: Payer: Self-pay | Admitting: Nurse Practitioner

## 2024-07-30 NOTE — Progress Notes (Signed)
 Patient not seen.

## 2024-07-30 NOTE — Telephone Encounter (Signed)
 Copied from CRM #[id number]. Topic: Appointments - Scheduling Inquiry for Clinic >> Jul 30, 2024  2:31 PM Beverly [name] wrote: Reason for CRM: Pt calling to reports she missed her appt scheduled for today for Med check plus, Medicare well visit, and to discuss test results from MRI.  States she went to incorrect provider's office.  Attempted to schedule appt for missed visit today, unable to schedule per EPIC did not provide scheduling template for this with provider.  Pt requesting a follow up call to schedule appt.  Pt can be reached at [phone]   Pt aware of same day call back.  Left message for pt to call back to reschedule

## 2024-07-31 ENCOUNTER — Ambulatory Visit: Admitting: Physical Medicine and Rehabilitation

## 2024-08-01 ENCOUNTER — Encounter: Payer: Self-pay | Admitting: Nurse Practitioner

## 2024-08-10 NOTE — Progress Notes (Unsigned)
 " [facility] [address] [place] [address] Phone: [phone] Subjective:   ISusannah Gully, am serving as a scribe for Dr. Arthea Claudene.  I'm seeing this patient by the request  of:  Early, Camie BRAVO, NP  CC: Low back pain  YEP:Dlagzrupcz  06/26/2024 Significant worsening symptoms at this time.  Patient's pain is out of proportion to the amount noted today.  Patient is having difficulty even with sitting.  Has some radiation going around the back and even almost into the stomach.  KUB and chest x-ray ordered today.  Laboratory workup also ordered.  CT abdomen and pelvis ordered to further evaluate this pain as well as the musculoskeletal aspect.  Toradol  and Depo-Medrol  injections given.  Does have tramadol .  Patient knows worsening pain to seek medical attention immediately.     Updated 08/14/2024 Lani Havlik Rosero is a 79 y.o. female coming in with complaint of back pain, patient has had significant difficulty with it for quite some time.  Patient did have a calcified mass noted on CT abdomen and pelvis with a 8.3 mm thickening of the endometrium.  2.6 cm ovarian cyst also noted. Back is feeling better now. Follow up to check in.   MRI of the lumbar spine showed a left lateral disc protrusion at L3-L4 as well as L4-L5 and a right sided 1 at L5-S1.  We discussed potential injections.  Patient has had the ordered and but has not scheduled.   NEED TO RECHECK KIDNEY FUNCTION AFTER VISIT  Past Medical History:  Diagnosis Date   Abdominal pain 10/29/2020   Acute sinusitis 05/07/2022   Allergic rhinitis    Allergy    Anemia    Anemia 02/11/2020   Asthma    Bee sting 01/15/2021   Cataract    bilateral cataracts removed   Chronic cough    COVID-19 12/01/2021   DDD (degenerative disc disease), cervical    Esophageal reflux    IBS (irritable bowel syndrome)    Menopausal problem 04/27/2023   Osteopenia 03/14/2013   DEXA @[facility]  02/2013: -1.8   Pain of  right lower leg 07/02/2022   Urinary tract infectious disease 03/07/2023   Past Surgical History:  Procedure Laterality Date   bilateral cateracts removed     BREAST LUMPECTOMY Left    FOOT SURGERY     right   LAPAROSCOPIC CHOLECYSTECTOMY  11/2009   right foot bunionectomy     TUBAL LIGATION     Social History   Socioeconomic History   Marital status: Married    Spouse name: Not on file   Number of children: 2   Years of education: Not on file   Highest education level: Bachelor's degree (e.g., BA, AB, BS)  Occupational History   Occupation: artis  Tobacco Use   Smoking status: Never    Passive exposure: Never   Smokeless tobacco: Never  Vaping Use   Vaping status: Never Used  Substance and Sexual Activity   Alcohol use: Yes    Alcohol/week: 7.0 standard drinks of alcohol    Types: 7 Glasses of wine per week    Comment: occ   Drug use: No   Sexual activity: Yes  Other Topics Concern   Not on file  Social History Narrative   Not on file   Social Drivers of Health   Tobacco Use: Low Risk (06/30/2024)   Patient History    Smoking Tobacco Use: Never    Smokeless Tobacco Use:  Never    Passive Exposure: Never  Financial Resource Strain: Low Risk (02/02/2024)   Overall Financial Resource Strain (CARDIA)    Difficulty of Paying Living Expenses: Not hard at all  Food Insecurity: No Food Insecurity (02/02/2024)   Epic    Worried About Programme Researcher, Broadcasting/film/video in the Last Year: Never true    Ran Out of Food in the Last Year: Never true  Transportation Needs: No Transportation Needs (02/02/2024)   Epic    Lack of Transportation (Medical): No    Lack of Transportation (Non-Medical): No  Physical Activity: Insufficiently Active (02/02/2024)   Exercise Vital Sign    Days of Exercise per Week: 3 days    Minutes of Exercise per Session: 20 min  Stress: No Stress Concern Present (02/02/2024)   [employer] of Occupational Health - Occupational Stress Questionnaire    Feeling  of Stress: Only a little  Social Connections: Unknown (02/02/2024)   Social Connection and Isolation Panel    Frequency of Communication with Friends and Family: Twice a week    Frequency of Social Gatherings with Friends and Family: Patient declined    Attends Religious Services: More than 4 times per year    Active Member of Clubs or Organizations: Yes    Attends Banker Meetings: More than 4 times per year    Marital Status: Married  Depression (PHQ2-9): Low Risk (02/02/2024)   Depression (PHQ2-9)    PHQ-2 Score: 0  Alcohol Screen: Low Risk (02/02/2024)   Alcohol Screen    Last Alcohol Screening Score (AUDIT): 3  Housing: Low Risk (02/02/2024)   Epic    Unable to Pay for Housing in the Last Year: No    Number of Times Moved in the Last Year: 0    Homeless in the Last Year: No  Utilities: Not on file  Health Literacy: Not on file   Allergies[1] Family History  Problem Relation Age of Onset   Pancreatic cancer Mother    Heart disease Father    Hypertension Brother    Hyperlipidemia Brother    Colon cancer Maternal Grandfather 26   Esophageal cancer Neg Hx    Liver disease Neg Hx     Current Outpatient Medications (Endocrine & Metabolic):    alendronate  (FOSAMAX ) 70 MG tablet, Take 1 tablet (70 mg total) by mouth once a week. Take with a full glass of water on an empty stomach.   Estradiol -Norethindrone  Acet 0.5-0.1 MG tablet, Take 1 tablet by mouth daily. (Patient taking differently: Take 1 tablet by mouth daily. 1 every 2 days)   methylPREDNISolone  (MEDROL  DOSEPAK) 4 MG TBPK tablet, Follow package insert  Current Outpatient Medications (Cardiovascular):    EPINEPHrine  0.3 mg/0.3 mL IJ SOAJ injection, Inject 0.3 mg into the muscle as needed for anaphylaxis.  Current Outpatient Medications (Respiratory):    budesonide -glycopyrrolate -formoterol  (BREZTRI  AEROSPHERE) 160-9-4.8 MCG/ACT AERO inhaler, Inhale 2 puffs into the lungs in the morning and at bedtime.    cetirizine  (ZYRTEC ) 10 MG tablet, Take 1 tablet (10 mg total) by mouth daily as needed for allergies (Can take an extra dose during flare ups.).   fluticasone  (FLONASE ) 50 MCG/ACT nasal spray, Place 2 sprays into both nostrils daily.  Current Outpatient Medications (Analgesics):    oxyCODONE  (ROXICODONE ) 5 MG immediate release tablet, Take 1 tablet (5 mg total) by mouth every 6 (six) hours as needed for up to 10 doses.  Current Outpatient Medications (Hematological):    IRON CR PO,   Current Outpatient  Medications (Other):    calcium carbonate 100 mg/ml SUSP*, Take 1 tablet by mouth daily at 12 noon.   COVID-19 mRNA bivalent vaccine, Pfizer, (PFIZER COVID-19 VAC BIVALENT) injection, Inject into the muscle.   COVID-19 mRNA Vac-TriS, Pfizer, (PFIZER-BIONT COVID-19 VAC-TRIS) SUSP injection, Inject into the muscle.   COVID-19 mRNA vaccine 2023-2024 (COMIRNATY ) syringe, Inject into the muscle.   COVID-19 mRNA vaccine 2023-2024 (COMIRNATY ) syringe, Inject 0.3 mLs into the muscle.   COVID-19 mRNA vaccine, Pfizer, (COMIRNATY ) syringe, Inject into the muscle.   cyclobenzaprine  (FLEXERIL ) 10 MG tablet, Take 1 tablet (10 mg total) by mouth 2 (two) times daily as needed for muscle spasms.   dexlansoprazole  (DEXILANT ) 60 MG capsule, Take 1 capsule (60 mg total) by mouth in the morning.   famotidine  (PEPCID ) 40 MG tablet, Take 1 tablet (40 mg total) by mouth at bedtime.   Influenza Vac A&B Surf Ant Adj (FLUAD) 0.5 ML SUSY, Inject 0.5 mLs into the muscle once. 2018   influenza vaccine adjuvanted (FLUAD) 0.5 ML injection, Inject into the muscle.   loperamide (IMODIUM A-D) 2 MG tablet, Take 1 tablet by mouth daily.   Multiple Vitamin (MULTIVITAMIN) tablet, Take 1 tablet by mouth daily.   Nutritional Supplements (VITAMIN D BOOSTER PO), Take 1 tablet by mouth daily at 12 noon.   RSV vaccine recomb adjuvanted (AREXVY ) 120 MCG/0.5ML injection, Inject into the muscle.   Spacer/Aero-Holding Chambers DEVI, 1 Device  by Does not apply route as directed.  * These medications belong to multiple therapeutic classes and are listed under each applicable group.   Reviewed prior external information including notes and imaging from  primary care provider As well as notes that were available from care everywhere and other healthcare systems.  Past medical history, social, surgical and family history all reviewed in electronic medical record.  No pertanent information unless stated regarding to the chief complaint.   Review of Systems:  No headache, visual changes, nausea, vomiting, diarrhea, constipation, dizziness, abdominal pain, skin rash, fevers, chills, night sweats, weight loss, swollen lymph nodes, body aches, joint swelling, chest pain, shortness of breath, mood changes. POSITIVE muscle aches  Objective  Blood pressure 122/74, pulse 69, height 5' 3 (1.6 m), weight 121 lb (54.9 kg), SpO2 95%.   General: No apparent distress alert and oriented x3 mood and affect normal, dressed appropriately.  HEENT: Pupils equal, extraocular movements intact  Respiratory: Patient's speak in full sentences and does not appear short of breath  Cardiovascular: No lower extremity edema, non tender, no erythema  Low back exam shows scoliosis noted.  Some tenderness to palpation still in the paraspinal musculature right greater than left.    Impression and Recommendations:     The above documentation has been reviewed and is accurate and complete Arthea CHRISTELLA Sharps, DO       [1]  Allergies Allergen Reactions   Tizanidine      Other Reaction(s): Nightmare   "

## 2024-08-14 ENCOUNTER — Ambulatory Visit

## 2024-08-14 ENCOUNTER — Ambulatory Visit: Admitting: Family Medicine

## 2024-08-14 VITALS — BP 122/74 | HR 69 | Ht 63.0 in | Wt 121.0 lb

## 2024-08-14 DIAGNOSIS — M533 Sacrococcygeal disorders, not elsewhere classified: Secondary | ICD-10-CM

## 2024-08-14 DIAGNOSIS — R109 Unspecified abdominal pain: Secondary | ICD-10-CM

## 2024-08-14 LAB — COMPREHENSIVE METABOLIC PANEL WITH GFR
ALT: 19 U/L (ref 3–35)
AST: 23 U/L (ref 5–37)
Albumin: 4.7 g/dL (ref 3.5–5.2)
Alkaline Phosphatase: 58 U/L (ref 39–117)
BUN: 22 mg/dL (ref 6–23)
CO2: 29 meq/L (ref 19–32)
Calcium: 9.9 mg/dL (ref 8.4–10.5)
Chloride: 104 meq/L (ref 96–112)
Creatinine, Ser: 1.05 mg/dL (ref 0.40–1.20)
GFR: 50.86 mL/min — ABNORMAL LOW
Glucose, Bld: 85 mg/dL (ref 70–99)
Potassium: 4.3 meq/L (ref 3.5–5.1)
Sodium: 141 meq/L (ref 135–145)
Total Bilirubin: 0.4 mg/dL (ref 0.2–1.2)
Total Protein: 7 g/dL (ref 6.0–8.3)

## 2024-08-14 LAB — URINALYSIS, ROUTINE W REFLEX MICROSCOPIC
Bacteria, UA: NONE SEEN
Bilirubin Urine: NEGATIVE
Hgb urine dipstick: NEGATIVE
Ketones, ur: NEGATIVE
Nitrite: NEGATIVE
RBC / HPF: NONE SEEN
Specific Gravity, Urine: 1.01 (ref 1.000–1.030)
Total Protein, Urine: NEGATIVE
Urine Glucose: NEGATIVE
Urobilinogen, UA: 0.2 (ref 0.0–1.0)
pH: 6 (ref 5.0–8.0)

## 2024-08-14 NOTE — Patient Instructions (Signed)
 Xray today Labs today See you again in 2 months

## 2024-08-14 NOTE — Assessment & Plan Note (Signed)
 Low back exam does have pain out of the ordinary still concerned that there was a potential for a kidney stone.  Will recheck your urine as well as check kidney function again with patient having difficulty with it previously.  Depending on findings this could change to medical management and may need urology.  Patient did have an abnormal CT and is following up with gynecology to have a biopsy done.  Follow-up with me again 6 to 8 weeks otherwise.  I personally spent a total of 31 minutes in the care of the patient today including preparing to see the patient, getting/reviewing separately obtained history, performing a medically appropriate exam/evaluation, counseling and educating, documenting clinical information in the EHR, and communicating results.

## 2024-08-15 ENCOUNTER — Ambulatory Visit: Payer: Self-pay | Admitting: Family Medicine

## 2024-09-06 ENCOUNTER — Encounter: Admitting: Nurse Practitioner

## 2024-09-12 ENCOUNTER — Encounter: Admitting: Nurse Practitioner

## 2024-10-02 ENCOUNTER — Ambulatory Visit: Admitting: Allergy and Immunology

## 2024-10-15 ENCOUNTER — Ambulatory Visit: Admitting: Family Medicine
